# Patient Record
Sex: Female | Born: 1990
Health system: Southern US, Community
[De-identification: ages and names within clinical notes are randomized; demographics above are authoritative.]

## PROBLEM LIST (undated history)

## (undated) ENCOUNTER — Inpatient Hospital Stay (HOSPITAL_COMMUNITY): Payer: Self-pay

## (undated) DIAGNOSIS — J45909 Unspecified asthma, uncomplicated: Secondary | ICD-10-CM

## (undated) HISTORY — PX: NO PAST SURGERIES: SHX2092

## (undated) HISTORY — PX: WISDOM TOOTH EXTRACTION: SHX21

---

## 2011-06-21 ENCOUNTER — Ambulatory Visit (INDEPENDENT_AMBULATORY_CARE_PROVIDER_SITE_OTHER): Payer: Self-pay | Admitting: Internal Medicine

## 2011-06-21 VITALS — BP 112/70 | HR 76 | Temp 98.5°F | Resp 16 | Ht 70.0 in | Wt 268.8 lb

## 2011-06-21 DIAGNOSIS — N926 Irregular menstruation, unspecified: Secondary | ICD-10-CM

## 2011-06-21 DIAGNOSIS — Z3202 Encounter for pregnancy test, result negative: Secondary | ICD-10-CM

## 2011-06-21 LAB — POCT CBC
Granulocyte percent: 57.4 %G (ref 37–80)
MCH, POC: 29.3 pg (ref 27–31.2)
MID (cbc): 0.4 (ref 0–0.9)
MPV: 9.5 fL (ref 0–99.8)
POC LYMPH PERCENT: 37.3 %L (ref 10–50)
POC MID %: 5.3 %M (ref 0–12)
Platelet Count, POC: 359 10*3/uL (ref 142–424)
RBC: 4.47 M/uL (ref 4.04–5.48)
RDW, POC: 13.6 %

## 2011-06-21 NOTE — Patient Instructions (Signed)
To [facility] if you have increased abdominal pain, heavier bleeding or fever. Call Sunday for lab results.

## 2011-06-21 NOTE — Progress Notes (Signed)
  Subjective:    Patient ID: Jamie [name], female    DOB: 28-Mar-1991, 21 y.o.   MRN: [id number]  HPI Ms. Spear presents today requesting pregnancy test. States menses should have started 06/17/11 and did not.  She checked a home pregnancy test that was positive the next day on 06/18/11.  She noticed pain that felt like menstrual cramps on 06/19/11 and then began vaginal bleeding on 06/20/11 to present. Keyly states her flow is typical for her normal menses and has not noticed any clotting that isn't typical for her cycle.  She did have one episode of n/v on 06/18/11 but none since. Yalexa states she had elective AB last year. She currently is not using any form of contraception.  Lo Estrin made her sick in past and desires the [name].  She has no current appointment for this to be done.   Review of Systems  Constitutional: Negative for fever and chills.  Gastrointestinal: Positive for nausea (one episode), vomiting and abdominal pain (cramping).  Genitourinary: Positive for vaginal bleeding and vaginal pain. Negative for menstrual problem and pelvic pain.  Neurological: Positive for headaches.       Objective:   Physical Exam  Constitutional: She is oriented to person, place, and time. Vital signs are normal. She appears well-developed and well-nourished.  Cardiovascular: Normal rate and regular rhythm.   Pulmonary/Chest: Effort normal and breath sounds normal.  Abdominal: Soft. Normal appearance. There is no tenderness.  Genitourinary: Uterus is not enlarged. Cervix exhibits no motion tenderness. Right adnexum displays no tenderness. Left adnexum displays tenderness.       Small amt of active bleeding at os. Copious blood in vault. No tissue noted.  Neurological: She is alert and oriented to person, place, and time.    Results for orders placed in visit on 06/21/11  POCT URINE PREGNANCY      Component Value Range   Preg Test, Ur Negative    POCT CBC      Component Value Range   WBC 7.4  4.6 -  10.2 (K/uL)   Lymph, poc 2.8  0.6 - 3.4    POC LYMPH PERCENT 37.3  10 - 50 (%L)   MID (cbc) 0.4  0 - 0.9    POC MID % 5.3  0 - 12 (%M)   POC Granulocyte 4.2  2 - 6.9    Granulocyte percent 57.4  37 - 80 (%G)   RBC 4.47  4.04 - 5.48 (M/uL)   Hemoglobin 13.1  12.2 - 16.2 (g/dL)   HCT, POC 40.9  81.1 - 47.9 (%)   MCV 92.5  80 - 97 (fL)   MCH, POC 29.3  27 - 31.2 (pg)   MCHC 31.7 (*) 31.8 - 35.4 (g/dL)   RDW, POC 91.4     Platelet Count, POC 359  142 - 424 (K/uL)   MPV 9.5  0 - 99.8 (fL)         Assessment & Plan:  Abnormal vaginal bleeding ?if regular menses vs spontaneous AB  Since home pregnancy test was positive and test in office today is negative, check quant HCG today.  Await these results to determine if further testing necessary as Alexsus is without pain at this point.  If she experiences increase bleeding, pain or fever, advised pt to go to Maternity Admissions at [facility] for evaluation. [facility] Dept Family Planning information given regarding [name] placement and other options.  Discussed with Dr. Merla Riches

## 2011-06-25 ENCOUNTER — Telehealth: Payer: Self-pay

## 2011-06-25 NOTE — Telephone Encounter (Signed)
Pt would like to know if lab results are in, she states that she was told that she would be called on Monday.

## 2011-06-25 NOTE — Telephone Encounter (Signed)
See lab msg. Pt notified

## 2012-12-14 ENCOUNTER — Encounter: Payer: Self-pay | Admitting: Obstetrics

## 2012-12-14 ENCOUNTER — Ambulatory Visit (INDEPENDENT_AMBULATORY_CARE_PROVIDER_SITE_OTHER): Payer: Medicaid Other | Admitting: Obstetrics

## 2012-12-14 VITALS — BP 113/75 | Temp 97.9°F | Wt 241.0 lb

## 2012-12-14 DIAGNOSIS — R519 Headache, unspecified: Secondary | ICD-10-CM

## 2012-12-14 DIAGNOSIS — Z369 Encounter for antenatal screening, unspecified: Secondary | ICD-10-CM

## 2012-12-14 DIAGNOSIS — Z3201 Encounter for pregnancy test, result positive: Secondary | ICD-10-CM

## 2012-12-14 DIAGNOSIS — Z113 Encounter for screening for infections with a predominantly sexual mode of transmission: Secondary | ICD-10-CM

## 2012-12-14 DIAGNOSIS — Z3402 Encounter for supervision of normal first pregnancy, second trimester: Secondary | ICD-10-CM

## 2012-12-14 DIAGNOSIS — R51 Headache: Secondary | ICD-10-CM

## 2012-12-14 LAB — POCT URINALYSIS DIPSTICK
Blood, UA: NEGATIVE
Glucose, UA: NEGATIVE
Nitrite, UA: NEGATIVE
Urobilinogen, UA: NEGATIVE

## 2012-12-14 NOTE — Progress Notes (Signed)
P-91 Pt states she is having lower abdomen cramping that comes and goes. Pt states she is having lower abdomen pain.   Subjective:    Jamie [name] is being seen today for her first obstetrical visit.  This is not a planned pregnancy. She is at [redacted]w[redacted]d gestation. Her obstetrical history is significant for none. Relationship with FOB: significant other, not living together. Patient does intend to breast feed. Pregnancy history fully reviewed.  Menstrual History: OB History   Grav Para Term Preterm Abortions TAB SAB Ect Mult Living   1               Last Pap: 07/2011 Last Results were normal Menarche age: 25 Regular Patient's last menstrual period was 08/19/2012.    The following portions of the patient's history were reviewed and updated as appropriate: allergies, current medications, past family history, past medical history, past social history, past surgical history and problem list.  Review of Systems Pertinent items are noted in HPI.    Objective:    General appearance: alert and no distress Pelvic: cervix normal in appearance, external genitalia normal, no adnexal masses or tenderness, no cervical motion tenderness and vagina normal without discharge Extremities: extremities normal, atraumatic, no cyanosis or edema    Assessment:    Pregnancy at [redacted]w[redacted]d weeks    Plan:    Initial labs drawn. Prenatal vitamins.  Counseling provided regarding continued use of seat belts, cessation of alcohol consumption, smoking or use of illicit drugs; infection precautions i.e., influenza/TDAP immunizations, toxoplasmosis,CMV, parvovirus, listeria and varicella; workplace safety, exercise during pregnancy; routine dental care, safe medications, sexual activity, hot tubs, saunas, pools, travel, caffeine use, fish and methlymercury, potential toxins, hair treatments, varicose veins Weight gain recommendations reviewed: underweight/BMI< 18.5--> gain 28 - 40 lbs; normal weight/BMI 18.5 - 24.9--> gain  25 - 35 lbs; overweight/BMI 25 - 29.9--> gain 15 - 25 lbs; obese/BMI >30->gain  11 - 20 lbs Problem list reviewed and updated. AFP3 discussed: requested. Role of ultrasound in pregnancy discussed; fetal survey: requested. Amniocentesis discussed: not indicated. Follow up in 4 weeks. 50% of 20 min visit spent on counseling and coordination of care.

## 2012-12-15 ENCOUNTER — Encounter: Payer: Self-pay | Admitting: Obstetrics

## 2012-12-15 LAB — OBSTETRIC PANEL
Basophils Absolute: 0 10*3/uL (ref 0.0–0.1)
Basophils Relative: 0 % (ref 0–1)
Eosinophils Absolute: 0.1 10*3/uL (ref 0.0–0.7)
Eosinophils Relative: 1 % (ref 0–5)
Hepatitis B Surface Ag: NEGATIVE
Lymphs Abs: 2.2 10*3/uL (ref 0.7–4.0)
MCH: 30.4 pg (ref 26.0–34.0)
Neutrophils Relative %: 61 % (ref 43–77)
Platelets: 293 10*3/uL (ref 150–400)
RBC: 3.72 MIL/uL — ABNORMAL LOW (ref 3.87–5.11)
RDW: 13.9 % (ref 11.5–15.5)

## 2012-12-15 LAB — WET PREP BY MOLECULAR PROBE
Gardnerella vaginalis: POSITIVE — AB
Trichomonas vaginosis: NEGATIVE

## 2012-12-15 LAB — GC/CHLAMYDIA PROBE AMP
CT Probe RNA: NEGATIVE
GC Probe RNA: NEGATIVE

## 2012-12-15 LAB — HIV ANTIBODY (ROUTINE TESTING W REFLEX): HIV: NONREACTIVE

## 2012-12-16 ENCOUNTER — Other Ambulatory Visit: Payer: Self-pay | Admitting: *Deleted

## 2012-12-16 DIAGNOSIS — B9689 Other specified bacterial agents as the cause of diseases classified elsewhere: Secondary | ICD-10-CM

## 2012-12-16 LAB — AFP, QUAD SCREEN
Age Alone: 1:1110 {titer}
Down Syndrome Scr Risk Est: <1:38500 {titer}
HCG, Total: 5465 m[IU]/mL
INH: 94.1 pg/mL
MoM for AFP: 0.83
MoM for hCG: 0.32
Open Spina bifida: NEGATIVE
Osb Risk: 1:48900 {titer}
Tri 18 Scr Risk Est: NEGATIVE
Trisomy 18 (Edward) Syndrome Interp.: 1:8290 {titer}

## 2012-12-16 LAB — HEMOGLOBINOPATHY EVALUATION
Hemoglobin Other: 0 %
Hgb A2 Quant: 2.7 % (ref 2.2–3.2)
Hgb A: 97.3 % (ref 96.8–97.8)
Hgb F Quant: 0 % (ref 0.0–2.0)
Hgb S Quant: 0 %

## 2012-12-16 MED ORDER — METRONIDAZOLE 500 MG PO TABS: 500.0000 mg | ORAL_TABLET | Freq: Two times a day (BID) | ORAL | Status: DC

## 2012-12-16 NOTE — Progress Notes (Signed)
lmomtcb x 1. Prescription sent to pharmacy.

## 2012-12-17 LAB — OB RESULTS CONSOLE GC/CHLAMYDIA
Chlamydia: NEGATIVE
GC PROBE AMP, GENITAL: NEGATIVE

## 2012-12-22 ENCOUNTER — Ambulatory Visit (INDEPENDENT_AMBULATORY_CARE_PROVIDER_SITE_OTHER): Payer: Medicaid Other

## 2012-12-22 ENCOUNTER — Other Ambulatory Visit: Payer: Medicaid Other

## 2012-12-22 ENCOUNTER — Encounter: Payer: Self-pay | Admitting: Obstetrics & Gynecology

## 2012-12-22 ENCOUNTER — Encounter: Payer: Self-pay | Admitting: *Deleted

## 2012-12-22 DIAGNOSIS — Z111 Encounter for screening for respiratory tuberculosis: Secondary | ICD-10-CM

## 2012-12-22 DIAGNOSIS — Z369 Encounter for antenatal screening, unspecified: Secondary | ICD-10-CM

## 2012-12-22 DIAGNOSIS — Z3400 Encounter for supervision of normal first pregnancy, unspecified trimester: Secondary | ICD-10-CM

## 2012-12-22 LAB — US OB DETAIL + 14 WK

## 2012-12-23 ENCOUNTER — Encounter: Payer: Self-pay | Admitting: Obstetrics & Gynecology

## 2012-12-23 ENCOUNTER — Encounter: Payer: Self-pay | Admitting: Obstetrics

## 2012-12-23 LAB — US OB DETAIL + 14 WK

## 2012-12-27 ENCOUNTER — Telehealth: Payer: Self-pay | Admitting: *Deleted

## 2012-12-27 NOTE — Telephone Encounter (Signed)
Patient calling for her test results and requesting Vitafol nano called to her pharmacy. Patient to come to office to pick up a copy of her results for school and Rx has been called to her pharmacy.

## 2013-01-10 ENCOUNTER — Ambulatory Visit (INDEPENDENT_AMBULATORY_CARE_PROVIDER_SITE_OTHER): Payer: Medicaid Other | Admitting: Obstetrics

## 2013-01-10 ENCOUNTER — Encounter: Payer: Medicaid Other | Admitting: Obstetrics

## 2013-01-10 ENCOUNTER — Encounter: Payer: Self-pay | Admitting: Obstetrics

## 2013-01-10 VITALS — BP 121/77 | Temp 98.6°F | Wt 249.0 lb

## 2013-01-10 DIAGNOSIS — Z3402 Encounter for supervision of normal first pregnancy, second trimester: Secondary | ICD-10-CM

## 2013-01-10 DIAGNOSIS — Z3400 Encounter for supervision of normal first pregnancy, unspecified trimester: Secondary | ICD-10-CM

## 2013-01-10 LAB — POCT URINALYSIS DIPSTICK
Bilirubin, UA: NEGATIVE
Blood, UA: NEGATIVE
Glucose, UA: NEGATIVE
Nitrite, UA: NEGATIVE
Urobilinogen, UA: NEGATIVE

## 2013-01-10 NOTE — Progress Notes (Signed)
P 75 Patient reports she is doing well.

## 2013-02-04 ENCOUNTER — Encounter: Payer: Self-pay | Admitting: Advanced Practice Midwife

## 2013-02-04 ENCOUNTER — Other Ambulatory Visit: Payer: Medicaid Other

## 2013-02-04 ENCOUNTER — Ambulatory Visit (INDEPENDENT_AMBULATORY_CARE_PROVIDER_SITE_OTHER): Payer: Medicaid Other | Admitting: Advanced Practice Midwife

## 2013-02-04 VITALS — BP 108/72 | Temp 98.3°F | Wt 246.0 lb

## 2013-02-04 DIAGNOSIS — Z3400 Encounter for supervision of normal first pregnancy, unspecified trimester: Secondary | ICD-10-CM | POA: Insufficient documentation

## 2013-02-04 DIAGNOSIS — Z3402 Encounter for supervision of normal first pregnancy, second trimester: Secondary | ICD-10-CM

## 2013-02-04 LAB — CBC
Hemoglobin: 12 g/dL (ref 12.0–15.0)
MCH: 32.1 pg (ref 26.0–34.0)
MCHC: 34.7 g/dL (ref 30.0–36.0)
RDW: 14.2 % (ref 11.5–15.5)

## 2013-02-04 LAB — POCT URINALYSIS DIPSTICK
Blood, UA: NEGATIVE
Ketones, UA: NEGATIVE
Nitrite, UA: NEGATIVE
pH, UA: 8

## 2013-02-04 NOTE — Progress Notes (Signed)
Pulse: 89

## 2013-02-04 NOTE — Progress Notes (Signed)
Subjective: Jamie [name] is a 22 y.o. at 24 weeks by LMP  Patient denies vaginal leaking of fluid or bleeding, denies contractions.  Reports positive fetal movment.  Patient reports recent mild constipation w/ hemorrhoids. Reports episode of mild bleeding w/ hemorrhoid.   Patient plans to breastfeed. Has + support at home, lives with her Mother. The [facility] is supportive and involved.   Objective: Filed Vitals:   02/04/13 0904  BP: 108/72  Temp: 98.3 F (36.8 C)   150 FHR 24 Fundal Height Fetal Position unknown  Assessment: Patient Active Problem List   Diagnosis Date Noted  . Supervision of normal first pregnancy 02/04/2013    Plan: Patient to return to clinic in 4 weeks GTT today Reviewed breastfeeding today. Reviewed treatment and prevention of constipation and hemorrhoids today. Reviewed warning signs in pregnancy. Patient to call with concerns PRN. Reviewed triage location.  Major Santerre Wilson Singer CNM

## 2013-02-05 LAB — HIV ANTIBODY (ROUTINE TESTING W REFLEX): HIV: NONREACTIVE

## 2013-02-05 LAB — GLUCOSE TOLERANCE, 2 HOURS W/ 1HR
Glucose, 1 hour: 85 mg/dL (ref 70–170)
Glucose, Fasting: 63 mg/dL — ABNORMAL LOW (ref 70–99)

## 2013-03-03 ENCOUNTER — Encounter: Payer: Medicaid Other | Admitting: Obstetrics

## 2013-03-04 ENCOUNTER — Encounter: Payer: Medicaid Other | Admitting: Advanced Practice Midwife

## 2013-03-07 ENCOUNTER — Ambulatory Visit (INDEPENDENT_AMBULATORY_CARE_PROVIDER_SITE_OTHER): Payer: Medicaid Other | Admitting: Obstetrics

## 2013-03-07 VITALS — BP 116/76 | Temp 98.6°F | Wt 247.0 lb

## 2013-03-07 DIAGNOSIS — Z3400 Encounter for supervision of normal first pregnancy, unspecified trimester: Secondary | ICD-10-CM

## 2013-03-07 DIAGNOSIS — Z3403 Encounter for supervision of normal first pregnancy, third trimester: Secondary | ICD-10-CM

## 2013-03-07 DIAGNOSIS — B49 Unspecified mycosis: Secondary | ICD-10-CM

## 2013-03-07 LAB — POCT URINALYSIS DIPSTICK
Bilirubin, UA: NEGATIVE
Blood, UA: NEGATIVE
Glucose, UA: NEGATIVE
Nitrite, UA: NEGATIVE
Spec Grav, UA: 1.015
Urobilinogen, UA: NEGATIVE
pH, UA: 7

## 2013-03-07 MED ORDER — CLOTRIMAZOLE 1 % EX CREA: TOPICAL_CREAM | Freq: Two times a day (BID) | CUTANEOUS | Status: DC

## 2013-03-07 NOTE — Progress Notes (Signed)
P 90 Patient reports she has some pressure at times. Patient has a rash under her R arm.

## 2013-03-08 ENCOUNTER — Encounter: Payer: Self-pay | Admitting: Obstetrics

## 2013-03-21 ENCOUNTER — Ambulatory Visit (INDEPENDENT_AMBULATORY_CARE_PROVIDER_SITE_OTHER): Payer: Medicaid Other | Admitting: Obstetrics

## 2013-03-21 ENCOUNTER — Encounter: Payer: Self-pay | Admitting: Obstetrics

## 2013-03-21 VITALS — BP 112/72 | Temp 97.8°F | Wt 247.0 lb

## 2013-03-21 DIAGNOSIS — Z3400 Encounter for supervision of normal first pregnancy, unspecified trimester: Secondary | ICD-10-CM

## 2013-03-21 DIAGNOSIS — Z3403 Encounter for supervision of normal first pregnancy, third trimester: Secondary | ICD-10-CM

## 2013-03-21 LAB — POCT URINALYSIS DIPSTICK
Bilirubin, UA: NEGATIVE
Blood, UA: NEGATIVE
Glucose, UA: NEGATIVE
Ketones, UA: NEGATIVE
Nitrite, UA: NEGATIVE
Spec Grav, UA: 1.02
Urobilinogen, UA: NEGATIVE

## 2013-03-21 NOTE — Progress Notes (Signed)
P 79 Patient reports she is fatigued. Patient states she has ligament pain.

## 2013-04-04 ENCOUNTER — Ambulatory Visit (INDEPENDENT_AMBULATORY_CARE_PROVIDER_SITE_OTHER): Payer: Medicaid Other | Admitting: Obstetrics

## 2013-04-04 VITALS — BP 115/79 | Temp 97.5°F | Wt 250.0 lb

## 2013-04-04 DIAGNOSIS — Z3403 Encounter for supervision of normal first pregnancy, third trimester: Secondary | ICD-10-CM

## 2013-04-04 DIAGNOSIS — Z3400 Encounter for supervision of normal first pregnancy, unspecified trimester: Secondary | ICD-10-CM

## 2013-04-04 LAB — POCT URINALYSIS DIPSTICK
Bilirubin, UA: NEGATIVE
Blood, UA: NEGATIVE
Glucose, UA: NEGATIVE
Nitrite, UA: NEGATIVE
Spec Grav, UA: 1.015
Urobilinogen, UA: NEGATIVE
pH, UA: 7

## 2013-04-04 NOTE — Progress Notes (Signed)
Pulse: 91

## 2013-04-18 ENCOUNTER — Ambulatory Visit (INDEPENDENT_AMBULATORY_CARE_PROVIDER_SITE_OTHER): Payer: Medicaid Other | Admitting: Obstetrics

## 2013-04-18 ENCOUNTER — Encounter: Payer: Self-pay | Admitting: Obstetrics

## 2013-04-18 VITALS — BP 123/75 | Temp 98.4°F | Wt 253.0 lb

## 2013-04-18 DIAGNOSIS — Z3400 Encounter for supervision of normal first pregnancy, unspecified trimester: Secondary | ICD-10-CM

## 2013-04-18 DIAGNOSIS — Z3403 Encounter for supervision of normal first pregnancy, third trimester: Secondary | ICD-10-CM

## 2013-04-18 LAB — POCT URINALYSIS DIPSTICK
Blood, UA: NEGATIVE
Glucose, UA: NEGATIVE
Protein, UA: NEGATIVE
Spec Grav, UA: 1.01
Urobilinogen, UA: NEGATIVE

## 2013-04-18 NOTE — Progress Notes (Signed)
Pulse- 80 

## 2013-04-25 ENCOUNTER — Ambulatory Visit (INDEPENDENT_AMBULATORY_CARE_PROVIDER_SITE_OTHER): Payer: Medicaid Other | Admitting: Obstetrics

## 2013-04-25 ENCOUNTER — Encounter: Payer: Self-pay | Admitting: Obstetrics

## 2013-04-25 VITALS — BP 116/77 | Temp 98.1°F | Wt 255.0 lb

## 2013-04-25 DIAGNOSIS — Z3403 Encounter for supervision of normal first pregnancy, third trimester: Secondary | ICD-10-CM

## 2013-04-25 DIAGNOSIS — Z3400 Encounter for supervision of normal first pregnancy, unspecified trimester: Secondary | ICD-10-CM

## 2013-04-25 LAB — POCT URINALYSIS DIPSTICK
Glucose, UA: NEGATIVE
Ketones, UA: NEGATIVE
Protein, UA: NEGATIVE
Spec Grav, UA: 1.01
pH, UA: 7

## 2013-04-25 NOTE — Progress Notes (Signed)
Pulse 83 Pt is doing well.   Pt needs GBS.

## 2013-04-27 LAB — STREP B DNA PROBE: GBSP: NEGATIVE

## 2013-05-04 ENCOUNTER — Ambulatory Visit (INDEPENDENT_AMBULATORY_CARE_PROVIDER_SITE_OTHER): Payer: Medicaid Other | Admitting: Obstetrics

## 2013-05-04 VITALS — BP 117/77 | Temp 98.1°F | Wt 254.0 lb

## 2013-05-04 DIAGNOSIS — Z3400 Encounter for supervision of normal first pregnancy, unspecified trimester: Secondary | ICD-10-CM

## 2013-05-04 DIAGNOSIS — Z3403 Encounter for supervision of normal first pregnancy, third trimester: Secondary | ICD-10-CM

## 2013-05-04 LAB — POCT URINALYSIS DIPSTICK
Bilirubin, UA: NEGATIVE
Blood, UA: NEGATIVE
Leukocytes, UA: NEGATIVE
Nitrite, UA: NEGATIVE
Spec Grav, UA: 1.02

## 2013-05-04 NOTE — Progress Notes (Signed)
Pulse 92 Pt states that she has had SOB for the past week, worse at night.  Pt is also having some pain between her shoulder blades.

## 2013-05-05 NOTE — L&D Delivery Note (Signed)
Delivery Note At 11:53 AM a viable female was delivered via Vaginal, Spontaneous Delivery (Presentation: ;  ).  APGAR: 8, 9; weight 7 lb 4.8 oz (3311 g).   Placenta status: Intact, Spontaneous.  Cord: 3 vessels with the following complications: None.  Cord pH: none  Anesthesia: None  Episiotomy: None Lacerations: 2nd degree;Perineal Suture Repair: 2.0  Chromic Est. Blood Loss (mL): 400  Mom to postpartum.  Baby to Couplet care / Skin to Skin.  Winford Hehn A 05/22/2013, 2:07 PM

## 2013-05-06 ENCOUNTER — Encounter: Payer: Self-pay | Admitting: Obstetrics

## 2013-05-10 ENCOUNTER — Encounter: Payer: Self-pay | Admitting: Obstetrics

## 2013-05-10 ENCOUNTER — Ambulatory Visit (INDEPENDENT_AMBULATORY_CARE_PROVIDER_SITE_OTHER): Payer: Medicaid Other | Admitting: Obstetrics

## 2013-05-10 VITALS — BP 112/76 | Temp 97.6°F | Wt 263.0 lb

## 2013-05-10 DIAGNOSIS — Z3403 Encounter for supervision of normal first pregnancy, third trimester: Secondary | ICD-10-CM

## 2013-05-10 DIAGNOSIS — Z3400 Encounter for supervision of normal first pregnancy, unspecified trimester: Secondary | ICD-10-CM

## 2013-05-10 LAB — POCT URINALYSIS DIPSTICK
Bilirubin, UA: NEGATIVE
Glucose, UA: NEGATIVE
KETONES UA: NEGATIVE
Leukocytes, UA: NEGATIVE
PH UA: 5
RBC UA: NEGATIVE
Spec Grav, UA: 1.02
Urobilinogen, UA: NEGATIVE

## 2013-05-10 NOTE — Progress Notes (Signed)
Pulse 73 Pt is doing well.

## 2013-05-17 ENCOUNTER — Encounter: Payer: Medicaid Other | Admitting: Obstetrics

## 2013-05-19 ENCOUNTER — Encounter: Payer: Medicaid Other | Admitting: Obstetrics

## 2013-05-20 ENCOUNTER — Ambulatory Visit (INDEPENDENT_AMBULATORY_CARE_PROVIDER_SITE_OTHER): Payer: Medicaid Other | Admitting: Obstetrics

## 2013-05-20 ENCOUNTER — Encounter: Payer: Self-pay | Admitting: Obstetrics

## 2013-05-20 VITALS — BP 136/83 | Temp 97.1°F | Wt 264.0 lb

## 2013-05-20 DIAGNOSIS — Z3400 Encounter for supervision of normal first pregnancy, unspecified trimester: Secondary | ICD-10-CM

## 2013-05-20 LAB — POCT URINALYSIS DIPSTICK
BILIRUBIN UA: NEGATIVE
KETONES UA: NEGATIVE
Nitrite, UA: NEGATIVE
RBC UA: NEGATIVE
SPEC GRAV UA: 1.015
Urobilinogen, UA: NEGATIVE
pH, UA: 6

## 2013-05-20 NOTE — Progress Notes (Signed)
Pulse- 87 Patient would like to discuss circ. information

## 2013-05-22 ENCOUNTER — Inpatient Hospital Stay (HOSPITAL_COMMUNITY)
Admission: AD | Admit: 2013-05-22 | Discharge: 2013-05-24 | DRG: 775 | Disposition: A | Discharge status: Home / Self Care | Payer: Medicaid Other | Source: Ambulatory Visit | Attending: Obstetrics | Admitting: Obstetrics

## 2013-05-22 ENCOUNTER — Encounter (HOSPITAL_COMMUNITY): Payer: Self-pay | Admitting: *Deleted

## 2013-05-22 DIAGNOSIS — Z3400 Encounter for supervision of normal first pregnancy, unspecified trimester: Secondary | ICD-10-CM

## 2013-05-22 DIAGNOSIS — IMO0001 Reserved for inherently not codable concepts without codable children: Secondary | ICD-10-CM

## 2013-05-22 LAB — CBC
HEMATOCRIT: 35.5 % — AB (ref 36.0–46.0)
HEMOGLOBIN: 12.5 g/dL (ref 12.0–15.0)
MCH: 31.6 pg (ref 26.0–34.0)
MCHC: 35.2 g/dL (ref 30.0–36.0)
MCV: 89.6 fL (ref 78.0–100.0)
Platelets: 219 10*3/uL (ref 150–400)
RBC: 3.96 MIL/uL (ref 3.87–5.11)
RDW: 13.5 % (ref 11.5–15.5)
WBC: 7.4 10*3/uL (ref 4.0–10.5)

## 2013-05-22 LAB — RPR: RPR Ser Ql: NONREACTIVE

## 2013-05-22 LAB — ABO/RH: ABO/RH(D): O POS

## 2013-05-22 LAB — TYPE AND SCREEN
ABO/RH(D): O POS
Antibody Screen: NEGATIVE

## 2013-05-22 MED ORDER — PROMETHAZINE HCL 25 MG/ML IJ SOLN: 25.0000 mg | Freq: Four times a day (QID) | INTRAMUSCULAR | Status: DC | PRN

## 2013-05-22 MED ORDER — LACTATED RINGERS IV SOLN
INTRAVENOUS | Status: DC
  Administered 2013-05-22: 09:00:00 via INTRAVENOUS

## 2013-05-22 MED ORDER — ONDANSETRON HCL 4 MG/2ML IJ SOLN: 4.0000 mg | INTRAMUSCULAR | Status: DC | PRN

## 2013-05-22 MED ORDER — OXYTOCIN 40 UNITS IN LACTATED RINGERS INFUSION - SIMPLE MED: 62.5000 mL/h | INTRAVENOUS | Status: DC | PRN

## 2013-05-22 MED ORDER — ZOLPIDEM TARTRATE 5 MG PO TABS: 5.0000 mg | ORAL_TABLET | Freq: Every evening | ORAL | Status: DC | PRN

## 2013-05-22 MED ORDER — OXYTOCIN BOLUS FROM INFUSION: 500.0000 mL | INTRAVENOUS | Status: DC

## 2013-05-22 MED ORDER — DIPHENHYDRAMINE HCL 50 MG/ML IJ SOLN: 12.5000 mg | INTRAMUSCULAR | Status: DC | PRN

## 2013-05-22 MED ORDER — METHYLERGONOVINE MALEATE 0.2 MG/ML IJ SOLN
INTRAMUSCULAR | Status: AC
  Administered 2013-05-22: 0.2 mg
  Filled 2013-05-22: qty 1

## 2013-05-22 MED ORDER — DIBUCAINE 1 % RE OINT: 1.0000 "application " | TOPICAL_OINTMENT | RECTAL | Status: DC | PRN

## 2013-05-22 MED ORDER — FLEET ENEMA 7-19 GM/118ML RE ENEM: 1.0000 | ENEMA | RECTAL | Status: DC | PRN

## 2013-05-22 MED ORDER — METHYLERGONOVINE MALEATE 0.2 MG/ML IJ SOLN: 0.2000 mg | Freq: Four times a day (QID) | INTRAMUSCULAR | Status: AC

## 2013-05-22 MED ORDER — SIMETHICONE 80 MG PO CHEW: 80.0000 mg | CHEWABLE_TABLET | ORAL | Status: DC | PRN

## 2013-05-22 MED ORDER — DIPHENHYDRAMINE HCL 25 MG PO CAPS: 25.0000 mg | ORAL_CAPSULE | Freq: Four times a day (QID) | ORAL | Status: DC | PRN

## 2013-05-22 MED ORDER — OXYTOCIN 40 UNITS IN LACTATED RINGERS INFUSION - SIMPLE MED
62.5000 mL/h | INTRAVENOUS | Status: DC
  Filled 2013-05-22: qty 1000

## 2013-05-22 MED ORDER — OXYCODONE-ACETAMINOPHEN 5-325 MG PO TABS: 1.0000 | ORAL_TABLET | ORAL | Status: DC | PRN

## 2013-05-22 MED ORDER — PHENYLEPHRINE 40 MCG/ML (10ML) SYRINGE FOR IV PUSH (FOR BLOOD PRESSURE SUPPORT)
80.0000 ug | PREFILLED_SYRINGE | INTRAVENOUS | Status: DC | PRN
  Filled 2013-05-22: qty 2

## 2013-05-22 MED ORDER — WITCH HAZEL-GLYCERIN EX PADS: 1.0000 "application " | MEDICATED_PAD | CUTANEOUS | Status: DC | PRN

## 2013-05-22 MED ORDER — LANOLIN HYDROUS EX OINT
TOPICAL_OINTMENT | CUTANEOUS | Status: DC | PRN
Start: 2013-05-22 — End: 2013-05-24

## 2013-05-22 MED ORDER — PRENATAL MULTIVITAMIN CH: 1.0000 | ORAL_TABLET | Freq: Every day | ORAL | Status: DC

## 2013-05-22 MED ORDER — LACTATED RINGERS IV SOLN: 500.0000 mL | Freq: Once | INTRAVENOUS | Status: DC

## 2013-05-22 MED ORDER — LACTATED RINGERS IV SOLN: 500.0000 mL | INTRAVENOUS | Status: DC | PRN

## 2013-05-22 MED ORDER — METHYLERGONOVINE MALEATE 0.2 MG PO TABS
0.2000 mg | ORAL_TABLET | Freq: Four times a day (QID) | ORAL | Status: AC
  Administered 2013-05-22 – 2013-05-23 (×5): 0.2 mg via ORAL
  Filled 2013-05-22 (×5): qty 1

## 2013-05-22 MED ORDER — EPHEDRINE 5 MG/ML INJ
10.0000 mg | INTRAVENOUS | Status: DC | PRN
  Filled 2013-05-22: qty 2

## 2013-05-22 MED ORDER — TETANUS-DIPHTH-ACELL PERTUSSIS 5-2.5-18.5 LF-MCG/0.5 IM SUSP
0.5000 mL | Freq: Once | INTRAMUSCULAR | Status: AC
  Administered 2013-05-22: 0.5 mL via INTRAMUSCULAR

## 2013-05-22 MED ORDER — CITRIC ACID-SODIUM CITRATE 334-500 MG/5ML PO SOLN: 30.0000 mL | ORAL | Status: DC | PRN

## 2013-05-22 MED ORDER — ACETAMINOPHEN 325 MG PO TABS: 650.0000 mg | ORAL_TABLET | ORAL | Status: DC | PRN

## 2013-05-22 MED ORDER — IBUPROFEN 600 MG PO TABS: 600.0000 mg | ORAL_TABLET | Freq: Four times a day (QID) | ORAL | Status: DC | PRN

## 2013-05-22 MED ORDER — NALBUPHINE HCL 10 MG/ML IJ SOLN
10.0000 mg | Freq: Four times a day (QID) | INTRAMUSCULAR | Status: DC | PRN
  Filled 2013-05-22: qty 1

## 2013-05-22 MED ORDER — SENNOSIDES-DOCUSATE SODIUM 8.6-50 MG PO TABS
2.0000 | ORAL_TABLET | ORAL | Status: DC
  Administered 2013-05-23 (×2): 2 via ORAL
  Filled 2013-05-22 (×2): qty 2

## 2013-05-22 MED ORDER — ONDANSETRON HCL 4 MG/2ML IJ SOLN: 4.0000 mg | Freq: Four times a day (QID) | INTRAMUSCULAR | Status: DC | PRN

## 2013-05-22 MED ORDER — COMPLETENATE 29-1 MG PO CHEW
1.0000 | CHEWABLE_TABLET | Freq: Every day | ORAL | Status: DC
  Administered 2013-05-23 (×2): 1 via ORAL
  Filled 2013-05-22 (×3): qty 1

## 2013-05-22 MED ORDER — FENTANYL 2.5 MCG/ML BUPIVACAINE 1/10 % EPIDURAL INFUSION (WH - ANES): 14.0000 mL/h | INTRAMUSCULAR | Status: DC | PRN

## 2013-05-22 MED ORDER — ONDANSETRON HCL 4 MG PO TABS: 4.0000 mg | ORAL_TABLET | ORAL | Status: DC | PRN

## 2013-05-22 MED ORDER — LIDOCAINE HCL (PF) 1 % IJ SOLN
30.0000 mL | INTRAMUSCULAR | Status: AC | PRN
  Administered 2013-05-22: 30 mL via SUBCUTANEOUS
  Filled 2013-05-22 (×2): qty 30

## 2013-05-22 MED ORDER — BENZOCAINE-MENTHOL 20-0.5 % EX AERO
1.0000 "application " | INHALATION_SPRAY | CUTANEOUS | Status: DC | PRN
  Administered 2013-05-22 – 2013-05-23 (×2): 1 via TOPICAL
  Filled 2013-05-22 (×2): qty 56

## 2013-05-22 MED ORDER — IBUPROFEN 600 MG PO TABS
600.0000 mg | ORAL_TABLET | Freq: Four times a day (QID) | ORAL | Status: DC
  Administered 2013-05-22 – 2013-05-24 (×6): 600 mg via ORAL
  Filled 2013-05-22 (×7): qty 1

## 2013-05-22 MED ORDER — NALBUPHINE HCL 10 MG/ML IJ SOLN
10.0000 mg | INTRAMUSCULAR | Status: DC | PRN
  Filled 2013-05-22: qty 1

## 2013-05-22 MED ORDER — MEDROXYPROGESTERONE ACETATE 150 MG/ML IM SUSP: 150.0000 mg | INTRAMUSCULAR | Status: DC | PRN

## 2013-05-22 NOTE — Lactation Note (Signed)
This note was copied from the chart of Jamie [name]. Lactation Consultation Note  Patient Name: Jamie Jamie [name] ZOXWR'UToday's Date: 05/22/2013 Reason for consult: Initial assessment of this primipara and her newborn at 818 hours of age.  Mom has lots of visitors and baby asleep at present but latched well after delivery for 10 minutes with [facility] of [facility].  Mom states that her nurse has shown her hand expression and LC encouraged watching baby for feeding cues and place baby STS at regular intervals. LC encouraged review of Baby and Me pp 9, 14 and 20-25 for STS and BF information. LC provided [facility] Resource brochure and reviewed [facility] services and list of community and web site resources.     Maternal Data Formula Feeding for Exclusion: No Infant to breast within first hour of birth: Yes (initial LATCH score=8 and baby nursed 10 minutes) Has patient been taught Hand Expression?: Yes (mom states that she has been shown how to express colostrum) Does the patient have breastfeeding experience prior to this delivery?: No  Feeding Feeding Type: Breast Fed Length of feed: 60 min  LATCH Score/Interventions           initial LATCH score=8, per RN assessment after delivery           Lactation Tools Discussed/Used WIC Program: Yes STS, hand expression, cue feedings  Consult Status Consult Status: Follow-up Date: 05/23/13 Follow-up type: In-patient    Warrick ParisianBryant, Kimani Bedoya [facility] 05/22/2013, 8:27 PM

## 2013-05-22 NOTE — Progress Notes (Signed)
Dr. Clearance CootsHarper notified patient presents for labor eval. SVE 4/90/-2. No previous exam to compare to. UC hard to trace, toco adjusted. Will monitor and recheck in 1 hour.

## 2013-05-22 NOTE — H&P (Signed)
Jamie [name] is a 23 y.o. female presenting for UC's. Maternal Medical History:  Reason for admission: Contractions.  23 yo G1.  EDC 05-26-13.  Presents with UC's.  Fetal activity: Perceived fetal activity is normal.    Prenatal complications: no prenatal complications Prenatal Complications - Diabetes: none.    OB History   Grav Para Term Preterm Abortions TAB SAB Ect Mult Living   1              History reviewed. No pertinent past medical history. History reviewed. No pertinent past surgical history. Family History: family history includes Diabetes in her father and maternal grandfather; Heart attack in her paternal grandmother; Heart disease in her father and maternal grandfather; Hypertension in her maternal grandfather; Stroke in her father. Social History:  reports that she has never smoked. She does not have any smokeless tobacco history on file. She reports that she does not drink alcohol or use illicit drugs.   Prenatal Transfer Tool  Maternal Diabetes: No Genetic Screening: Normal Maternal Ultrasounds/Referrals: Normal Fetal Ultrasounds or other Referrals:  None Maternal Substance Abuse:  No Significant Maternal Medications:  None Significant Maternal Lab Results:  None Other Comments:  None  Review of Systems  All other systems reviewed and are negative.    Dilation: 10 Effacement (%): 100 Station: +1 Exam by:: J.Thornton, RN Blood pressure 135/69, pulse 89, temperature 98.7 F (37.1 C), temperature source Oral, resp. rate 18, height 5\' 9"  (1.753 m), weight 264 lb (119.75 kg), last menstrual period 08/19/2012, SpO2 99.00%. Maternal Exam:  Uterine Assessment: Contraction strength is moderate.  Abdomen: Patient reports no abdominal tenderness. Fetal presentation: vertex  Introitus: Normal vulva. Normal vagina.  Cervix: Cervix evaluated by digital exam.     Physical Exam  Nursing note and vitals reviewed. Constitutional: She is oriented to person, place, and  time. She appears well-developed and well-nourished.  HENT:  Head: Normocephalic and atraumatic.  Eyes: Conjunctivae are normal. Pupils are equal, round, and reactive to light.  Neck: Normal range of motion. Neck supple.  Cardiovascular: Normal rate and regular rhythm.   Respiratory: Effort normal and breath sounds normal.  GI: Soft.  Genitourinary: Vagina normal and uterus normal.  Musculoskeletal: Normal range of motion.  Neurological: She is alert and oriented to person, place, and time.  Skin: Skin is warm and dry.  Psychiatric: She has a normal mood and affect. Her behavior is normal. Judgment and thought content normal.    Prenatal labs: ABO, Rh: --/--/O POS (01/18 0910) Antibody: NEG (01/18 0910) Rubella: 1.45 (08/12 1527) RPR: NON REAC (10/03 1302)  HBsAg: NEGATIVE (08/12 1527)  HIV: NON REACTIVE (10/03 1302)  GBS: NEGATIVE (12/22 1505)   Assessment/Plan: 39.3 weeks.  Active labor,  Admit.  Jos Cygan A 05/22/2013, 11:40 AM

## 2013-05-22 NOTE — MAU Note (Signed)
Contractions every 5 minutes since 0200. Denies vaginal bleeding and LOF. Good FM.

## 2013-05-22 NOTE — Progress Notes (Signed)
Jamie [name] is a 23 y.o. G1P0 at 563w3d by LMP admitted for active labor  Subjective:   Objective: BP 135/69  Pulse 89  Temp(Src) 98.7 F (37.1 C) (Oral)  Resp 18  Ht 5\' 9"  (1.753 m)  Wt 264 lb (119.75 kg)  BMI 38.97 kg/m2  SpO2 99%  LMP 08/19/2012      FHT:  FHR: 120 bpm, variability: moderate,  accelerations:  Present,  decelerations:  Present variable UC:   regular, every 3 minutes SVE:   Dilation: 10 Effacement (%): 100 Station: +1 Exam by:: dr. Clearance Cootsharper  Labs: Lab Results  Component Value Date   WBC 7.4 05/22/2013   HGB 12.5 05/22/2013   HCT 35.5* 05/22/2013   MCV 89.6 05/22/2013   PLT 219 05/22/2013    Assessment / Plan: Spontaneous labor, progressing normally  Labor: Progressing normally Preeclampsia:  n/a Fetal Wellbeing:  Category I Pain Control:  Labor support without medications I/D:  n/a Anticipated MOD:  NSVD  HARPER,CHARLES A 05/22/2013, 11:44 AM

## 2013-05-23 LAB — CBC
HEMATOCRIT: 35.6 % — AB (ref 36.0–46.0)
Hemoglobin: 12.2 g/dL (ref 12.0–15.0)
MCH: 31 pg (ref 26.0–34.0)
MCHC: 34.3 g/dL (ref 30.0–36.0)
MCV: 90.4 fL (ref 78.0–100.0)
Platelets: 211 10*3/uL (ref 150–400)
RBC: 3.94 MIL/uL (ref 3.87–5.11)
RDW: 13.7 % (ref 11.5–15.5)
WBC: 10.5 10*3/uL (ref 4.0–10.5)

## 2013-05-23 NOTE — Progress Notes (Signed)
Post Partum Day 1 Subjective: no complaints  Objective: Blood pressure 112/80, pulse 81, temperature 98 F (36.7 C), temperature source Oral, resp. rate 20, height 5\' 9"  (1.753 m), weight 264 lb (119.75 kg), last menstrual period 08/19/2012, SpO2 98.00%, unknown if currently breastfeeding.  Physical Exam:  General: alert and no distress Lochia: appropriate Uterine Fundus: firm Incision: healing well DVT Evaluation: No evidence of DVT seen on physical exam.   Recent Labs  05/22/13 0910  HGB 12.5  HCT 35.5*    Assessment/Plan: Plan for discharge tomorrow   LOS: 1 day   HARPER,CHARLES A 05/23/2013, 6:23 AM

## 2013-05-23 NOTE — Progress Notes (Signed)
UR chart review completed.  

## 2013-05-24 ENCOUNTER — Encounter: Payer: Medicaid Other | Admitting: Obstetrics

## 2013-05-24 MED ORDER — OXYCODONE-ACETAMINOPHEN 5-325 MG PO TABS: 1.0000 | ORAL_TABLET | ORAL | Status: DC | PRN

## 2013-05-24 MED ORDER — IBUPROFEN 600 MG PO TABS: 600.0000 mg | ORAL_TABLET | Freq: Four times a day (QID) | ORAL | Status: DC | PRN

## 2013-05-24 NOTE — Discharge Instructions (Signed)
Before Baby Comes Home °Ask any questions about feeding, diapering, and baby care before you leave the hospital. Ask again if you do not understand. Ask when you need to see the doctor again. °There are several things you must have before your baby comes home. °· Infant car seat. °· Crib. °· Do not let your baby sleep in a bed with you or anyone else. °· If you do not have a bed for your baby, ask the doctor what you can use that will be safe for the baby to sleep in. °Infant feeding supplies: °· 6 to 8 bottles (8 oz. size). °· 6 to 8 nipples. °· Measuring cup. °· Measuring tablespoon. °· Bottle brush. °· Sterilizer (or use any large pan or kettle with a lid). °· Formula that contains iron. °· A way to boil and cool water. °Breastfeeding supplies: °· Breast pump. °· Nipple cream. °Clothing: °· 24 to 36 cloth diapers and waterproof diaper covers or a box of disposable diapers. You may need as many as 10 to 12 diapers per day. °· 3 onesies (other clothing will depend on the time of year and the weather). °· 3 receiving blankets. °· 3 baby pajamas or gowns. °· 3 bibs. °Bath equipment: °· Mild soap. °· Petroleum jelly. No baby oil or powder. °· Soft cloth towel and wash cloth. °· Cotton balls. °· Separate bath basin for baby. Only sponge bathe until umbilical cord and circumcision are healed. °Other supplies: °· Thermometer and bulb syringe (ask the hospital to send them home with you). Ask your doctor about how you should take your baby's temperature. °· One to two pacifiers. °Prepare for an emergency: °· Know how to get to the hospital and know where to admit your baby. °· Put all doctor numbers near your house phone and in your cell phone if you have one. °Prepare your family: °· Talk with siblings about the baby coming home and how they feel about it. °· Decide how you want to handle visitors and other family members. °· Take offers for help with the baby. You will need time to adjust. °Know when to call the doctor.   °GET HELP RIGHT AWAY IF: °· Your baby's temperature is greater than 100.4° F (38° C). °· The softspot on your baby's head starts to bulge. °· Your baby is crying with no tears or has no wet diapers for 6 hours. °· Your baby has rapid breathing. °· Your baby is not as alert. °Document Released: 04/03/2008 Document Revised: 07/14/2011 Document Reviewed: 07/11/2010 °ExitCare® Patient Information ©2014 [employer]. ° °

## 2013-05-24 NOTE — Progress Notes (Signed)
Post Partum Day 2 Subjective: no complaints  Objective: Blood pressure 105/71, pulse 85, temperature 98.7 F (37.1 C), temperature source Oral, resp. rate 20, height 5\' 9"  (1.753 m), weight 264 lb (119.75 kg), last menstrual period 08/19/2012, SpO2 98.00%, unknown if currently breastfeeding.  Physical Exam:  General: alert and no distress Lochia: appropriate Uterine Fundus: firm Incision: healing well DVT Evaluation: No evidence of DVT seen on physical exam.   Recent Labs  05/22/13 0910 05/23/13 0615  HGB 12.5 12.2  HCT 35.5* 35.6*    Assessment/Plan: Discharge home   LOS: 2 days   Kharlie Bring A 05/24/2013, 8:50 AM

## 2013-05-24 NOTE — Discharge Summary (Signed)
Obstetric Discharge Summary Reason for Admission: onset of labor Prenatal Procedures: ultrasound Intrapartum Procedures: spontaneous vaginal delivery Postpartum Procedures: none Complications-Operative and Postpartum: none Hemoglobin  Date Value Range Status  05/23/2013 12.2  12.0 - 15.0 g/dL Final  1/47/82952/16/2013 62.113.1  12.2 - 16.2 g/dL Corrected     HCT  Date Value Range Status  05/23/2013 35.6* 36.0 - 46.0 % Final     HCT, POC  Date Value Range Status  06/21/2011 41.3  37.7 - 47.9 % Corrected    Physical Exam:  General: alert and no distress Lochia: appropriate Uterine Fundus: firm Incision: healing well DVT Evaluation: No evidence of DVT seen on physical exam.  Discharge Diagnoses: Term Pregnancy-delivered  Discharge Information: Date: 05/24/2013 Activity: pelvic rest Diet: routine Medications: PNV, Ibuprofen, Colace and Percocet Condition: stable Instructions: refer to practice specific booklet Discharge to: home Follow-up Information   Follow up with HARPER,CHARLES A, MD. Schedule an appointment as soon as possible for a visit today.   Specialty:  Obstetrics and Gynecology   Contact information:   [address] [place] [id number] [phone]       Newborn Data: Live born female  Birth Weight: 7 lb 4.8 oz (3311 g) APGAR: 8, 9  Home with mother.  HARPER,CHARLES A 05/24/2013, 9:00 AM

## 2013-06-10 ENCOUNTER — Encounter: Payer: Self-pay | Admitting: Advanced Practice Midwife

## 2013-06-10 ENCOUNTER — Ambulatory Visit (INDEPENDENT_AMBULATORY_CARE_PROVIDER_SITE_OTHER): Payer: Medicaid Other | Admitting: Advanced Practice Midwife

## 2013-06-10 NOTE — Progress Notes (Signed)
  Subjective: no complaints and desires [name] Patient is breast and bottle feeding. She has returned back to school and she doesn't feel she can make the time to pump. She has good milk supply. Desires the [name] for contraception. Besides being a little tired overall feels good.  Objective: Blood pressure 109/72, pulse 83, temperature 97.7 F (36.5 C), height 5\' 9"  (1.753 m), weight 247 lb (112.038 kg), currently breastfeeding.  Physical Exam:  General: alert and cooperative Lochia: appropriate  Assessment/Plan: Contraception [name], handout and procedure discussed in detail. Reviewed risk and benefits. Breast and bottle feeding. Reviewed methods to help w/ pumping. Patient to RTC @ 6 weeks PP for [name] and visit.  20 min spent with patient greater than 80% spent in counseling and coordination of care.    Gerrica Cygan 06/10/2013, 4:47 PM

## 2013-06-10 NOTE — Progress Notes (Signed)
Patient in office today for post partum visit. Patient states she is 3 weeks post Partum. Patient states she has no bleeding. Patient states bowels and bladder are functioning normally. Patient states everything is WNL for her and the baby. Patient states she would like to talk about birth control. Patient states she would like to know if it is safe to use her lotrimin cream while breast feeding.

## 2013-07-06 ENCOUNTER — Ambulatory Visit (INDEPENDENT_AMBULATORY_CARE_PROVIDER_SITE_OTHER): Payer: Medicaid Other | Admitting: Obstetrics

## 2013-07-06 ENCOUNTER — Encounter: Payer: Self-pay | Admitting: Obstetrics

## 2013-07-06 DIAGNOSIS — N949 Unspecified condition associated with female genital organs and menstrual cycle: Secondary | ICD-10-CM

## 2013-07-06 DIAGNOSIS — Z3009 Encounter for other general counseling and advice on contraception: Secondary | ICD-10-CM

## 2013-07-06 DIAGNOSIS — R102 Pelvic and perineal pain: Secondary | ICD-10-CM

## 2013-07-06 NOTE — Progress Notes (Signed)
Subjective:     Jamie [name] is a 23 y.o. female who presents for a postpartum visit. She is 6 weeks postpartum following a spontaneous vaginal delivery. I have fully reviewed the prenatal and intrapartum course. The delivery was at 39 gestational weeks. Outcome: spontaneous vaginal delivery. Anesthesia: none. Postpartum course has been WNL. Patient states she just has little pains in her pelvic area.  Baby's course has been WNL. Baby is feeding by breast. Bleeding no bleeding. Bowel function is normal. Bladder function is normal. Patient is sexually active. Contraception method is none. Postpartum depression screening: negative.  The following portions of the patient's history were reviewed and updated as appropriate: allergies, current medications, past family history, past medical history, past social history, past surgical history and problem list.  Review of Systems Pertinent items are noted in HPI.   Objective:    LMP 05/22/2013  General:  alert and no distress Breasts:  Negative Abdomen:  Soft, NT. NEFG.  Uterus NSSC, NT.   Assessment:     Normal postpartum exam. Pap smear not done at today's visit.   Plan:    1. Contraception: [name] 2. Continue Micronor until [name] insertion. 3. Follow up in: 4 weeks or as needed.

## 2013-07-07 LAB — GC/CHLAMYDIA PROBE AMP
CT Probe RNA: NEGATIVE
GC Probe RNA: NEGATIVE

## 2013-07-07 LAB — WET PREP BY MOLECULAR PROBE
Candida species: NEGATIVE
Gardnerella vaginalis: NEGATIVE
Trichomonas vaginosis: NEGATIVE

## 2013-08-02 ENCOUNTER — Ambulatory Visit (INDEPENDENT_AMBULATORY_CARE_PROVIDER_SITE_OTHER): Payer: Medicaid Other | Admitting: Obstetrics

## 2013-08-02 ENCOUNTER — Encounter: Payer: Self-pay | Admitting: Obstetrics

## 2013-08-02 DIAGNOSIS — Z01818 Encounter for other preprocedural examination: Secondary | ICD-10-CM

## 2013-08-02 DIAGNOSIS — Z30430 Encounter for insertion of intrauterine contraceptive device: Secondary | ICD-10-CM

## 2013-08-02 LAB — WET PREP BY MOLECULAR PROBE
CANDIDA SPECIES: NEGATIVE
GARDNERELLA VAGINALIS: NEGATIVE
TRICHOMONAS VAG: NEGATIVE

## 2013-08-02 NOTE — Progress Notes (Signed)
[name] Insertion Procedure Note  Pre-operative Diagnosis: Desire contraception  Post-operative Diagnosis: same  Indications: contraception  Procedure Details  Urine pregnancy test was done in office and result was negative.  The risks (including infection, bleeding, pain, and uterine perforation) and benefits of the procedure were explained to the patient and Written informed consent was obtained.  Last unprotected sexual intercourse was 1 month ago.  Cervix cleansed with Betadine. Uterus sounded to 6 cm. [name] inserted without difficulty. String visible and trimmed. Patient tolerated procedure well  Condition: Stable  Complications: None  Plan:  The patient was advised to call for any fever or for prolonged or severe pain or bleeding. She was advised to use NSAID as needed for mild to moderate pain.   Attending Physician Documentation: I was present for or participated in the entire procedure, including opening and closing.

## 2013-08-04 LAB — GC/CHLAMYDIA PROBE AMP
CT PROBE, AMP APTIMA: NEGATIVE
GC PROBE AMP APTIMA: NEGATIVE

## 2013-08-06 ENCOUNTER — Encounter: Payer: Self-pay | Admitting: Obstetrics

## 2013-09-13 ENCOUNTER — Encounter: Payer: Self-pay | Admitting: Obstetrics

## 2013-09-13 ENCOUNTER — Ambulatory Visit (INDEPENDENT_AMBULATORY_CARE_PROVIDER_SITE_OTHER): Payer: Medicaid Other | Admitting: Obstetrics

## 2013-09-13 VITALS — BP 107/71 | HR 75 | Temp 98.7°F | Ht 69.0 in | Wt 256.0 lb

## 2013-09-13 DIAGNOSIS — Z30431 Encounter for routine checking of intrauterine contraceptive device: Secondary | ICD-10-CM

## 2013-09-14 ENCOUNTER — Encounter: Payer: Self-pay | Admitting: Obstetrics

## 2013-09-14 NOTE — Progress Notes (Signed)
Subjective:     Jamie [name] is a 23 y.o. female here for a routine exam.  Current complaints:   Presents for [name] surveillance.  No complaints.    Personal health questionnaire:  Is patient Jamie [name], have a family history of breast and/or ovarian cancer: no Is there a family history of uterine cancer diagnosed at age < 7950, gastrointestinal cancer, urinary tract cancer, family member who is a Personnel officerLynch syndrome-associated carrier: no Is the patient overweight and hypertensive, family history of diabetes, personal history of gestational diabetes or PCOS: no Is patient over 6055, have PCOS,  family history of premature CHD under age 24, diabetes, smoke, have hypertension or peripheral artery disease:  no  The HPI was reviewed and explored in further detail by the provider. Gynecologic History Patient's last menstrual period was 08/12/2013. Contraception: [name]   Obstetric History OB History  Gravida Para Term Preterm AB SAB TAB Ectopic Multiple Living  1 1 1       1     # Outcome Date GA Lbr Len/2nd Weight Sex Delivery Anes PTL Lv  1 TRM 05/22/13 [id number] 09:35 / 00:18 7 lb 4.8 oz (3.311 kg) M SVD None  Y      History reviewed. No pertinent past medical history.  History reviewed. No pertinent past surgical history.  Current outpatient prescriptions:prenatal vitamin w/FE, FA (PRENATAL 1 + 1) 27-1 MG TABS tablet, Take 1 tablet by mouth daily at 12 noon., Disp: , Rfl:  Allergies  Allergen Reactions  . Shellfish Allergy Swelling    Throat     History  Substance Use Topics  . Smoking status: Never Smoker   . Smokeless tobacco: Never Used  . Alcohol Use: No    Family History  Problem Relation Age of Onset  . Diabetes Father   . Stroke Father   . Heart disease Father   . Diabetes Maternal Grandfather   . Heart disease Maternal Grandfather   . Hypertension Maternal Grandfather   . Heart attack Paternal Grandmother       Review of Systems  Constitutional: negative for fatigue  and weight loss Respiratory: negative for cough and wheezing Cardiovascular: negative for chest pain, fatigue and palpitations Gastrointestinal: negative for abdominal pain and change in bowel habits Musculoskeletal:negative for myalgias Neurological: negative for gait problems and tremors Behavioral/Psych: negative for abusive relationship, depression Endocrine: negative for temperature intolerance   Genitourinary:negative for abnormal menstrual periods, genital lesions, hot flashes, sexual problems and vaginal discharge Integument/breast: negative for breast lump, breast tenderness, nipple discharge and skin lesion(s)    Objective:       BP 107/71  Pulse 75  Temp(Src) 98.7 F (37.1 C)  Ht 5\' 9"  (1.753 m)  Wt 256 lb (116.121 kg)  BMI 37.79 kg/m2  LMP 08/12/2013  Breastfeeding? No General:   alert  Skin:   no rash or abnormalities  Lungs:   clear to auscultation bilaterally  Heart:   regular rate and rhythm, S1, S2 normal, no murmur, click, rub or gallop  Breasts:   normal without suspicious masses, skin or nipple changes or axillary nodes  Abdomen:  normal findings: no organomegaly, soft, non-tender and no hernia  Pelvis:  External genitalia: normal general appearance Urinary system: urethral meatus normal and bladder without fullness, nontender Vaginal: normal without tenderness, induration or masses Cervix: normal appearance.  [name] string visible. Adnexa: normal bimanual exam Uterus: anteverted and non-tender, normal size   Lab Review Urine pregnancy test Labs reviewed yes Radiologic studies reviewed no  Assessment:    Healthy female exam.  Pleased with [name].   Plan:    Follow up in: several months.   No orders of the defined types were placed in this encounter.   No orders of the defined types were placed in this encounter.

## 2013-09-16 ENCOUNTER — Encounter (HOSPITAL_COMMUNITY): Payer: Self-pay | Admitting: Emergency Medicine

## 2013-09-16 ENCOUNTER — Emergency Department (HOSPITAL_COMMUNITY)
Admission: EM | Admit: 2013-09-16 | Discharge: 2013-09-16 | Disposition: A | Discharge status: Home / Self Care | Payer: Medicaid Other | Attending: Emergency Medicine | Admitting: Emergency Medicine

## 2013-09-16 ENCOUNTER — Emergency Department (HOSPITAL_COMMUNITY): Payer: Medicaid Other

## 2013-09-16 DIAGNOSIS — Y92410 Unspecified street and highway as the place of occurrence of the external cause: Secondary | ICD-10-CM | POA: Insufficient documentation

## 2013-09-16 DIAGNOSIS — Y9389 Activity, other specified: Secondary | ICD-10-CM | POA: Insufficient documentation

## 2013-09-16 DIAGNOSIS — S8990XA Unspecified injury of unspecified lower leg, initial encounter: Secondary | ICD-10-CM | POA: Diagnosis present

## 2013-09-16 DIAGNOSIS — T07XXXA Unspecified multiple injuries, initial encounter: Secondary | ICD-10-CM | POA: Insufficient documentation

## 2013-09-16 DIAGNOSIS — S298XXA Other specified injuries of thorax, initial encounter: Secondary | ICD-10-CM | POA: Insufficient documentation

## 2013-09-16 DIAGNOSIS — S99919A Unspecified injury of unspecified ankle, initial encounter: Secondary | ICD-10-CM | POA: Diagnosis present

## 2013-09-16 MED ORDER — HYDROCODONE-ACETAMINOPHEN 7.5-325 MG/15ML PO SOLN: ORAL | Status: DC

## 2013-09-16 MED ORDER — HYDROCODONE-ACETAMINOPHEN 7.5-325 MG/15ML PO SOLN
10.0000 mL | Freq: Once | ORAL | Status: AC
  Administered 2013-09-16: 10 mL via ORAL
  Filled 2013-09-16: qty 15

## 2013-09-16 NOTE — Progress Notes (Signed)
Orthopedic Tech Progress Note Patient Details:  Jamie [name] 08/16/1990 [id number] Crutches fit for height and comfort. Ace wrap applied to RLE. Ortho Devices Type of Ortho Device: Crutches;Ace wrap Ortho Device/Splint Location: RLE Ortho Device/Splint Interventions: Application   Asia R Thompson 09/16/2013, 5:05 PM

## 2013-09-16 NOTE — ED Notes (Signed)
Pt now complaining of pain to right ankle.
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
## 2013-09-30 ENCOUNTER — Ambulatory Visit: Payer: No Typology Code available for payment source | Attending: Family Medicine

## 2013-09-30 DIAGNOSIS — IMO0001 Reserved for inherently not codable concepts without codable children: Secondary | ICD-10-CM | POA: Insufficient documentation

## 2013-09-30 DIAGNOSIS — M542 Cervicalgia: Secondary | ICD-10-CM | POA: Insufficient documentation

## 2013-09-30 DIAGNOSIS — M2560 Stiffness of unspecified joint, not elsewhere classified: Secondary | ICD-10-CM | POA: Insufficient documentation

## 2013-09-30 DIAGNOSIS — R5381 Other malaise: Secondary | ICD-10-CM | POA: Insufficient documentation

## 2013-10-05 ENCOUNTER — Ambulatory Visit: Payer: Medicaid Other | Admitting: Obstetrics

## 2013-10-05 ENCOUNTER — Ambulatory Visit: Payer: No Typology Code available for payment source | Attending: Family Medicine

## 2013-10-05 DIAGNOSIS — M542 Cervicalgia: Secondary | ICD-10-CM | POA: Diagnosis not present

## 2013-10-05 DIAGNOSIS — M2560 Stiffness of unspecified joint, not elsewhere classified: Secondary | ICD-10-CM | POA: Insufficient documentation

## 2013-10-05 DIAGNOSIS — IMO0001 Reserved for inherently not codable concepts without codable children: Secondary | ICD-10-CM | POA: Diagnosis present

## 2013-10-05 DIAGNOSIS — R5381 Other malaise: Secondary | ICD-10-CM | POA: Diagnosis not present

## 2013-10-12 ENCOUNTER — Encounter: Payer: Medicaid Other | Admitting: Physical Therapy

## 2013-10-13 ENCOUNTER — Ambulatory Visit: Payer: No Typology Code available for payment source | Admitting: Physical Therapy

## 2013-10-13 DIAGNOSIS — IMO0001 Reserved for inherently not codable concepts without codable children: Secondary | ICD-10-CM | POA: Diagnosis not present

## 2013-10-14 ENCOUNTER — Ambulatory Visit: Payer: No Typology Code available for payment source | Admitting: Physical Therapy

## 2013-10-20 ENCOUNTER — Ambulatory Visit: Payer: No Typology Code available for payment source

## 2013-10-20 DIAGNOSIS — IMO0001 Reserved for inherently not codable concepts without codable children: Secondary | ICD-10-CM | POA: Diagnosis not present

## 2013-10-28 ENCOUNTER — Ambulatory Visit: Payer: No Typology Code available for payment source | Admitting: Physical Therapy

## 2013-10-28 DIAGNOSIS — IMO0001 Reserved for inherently not codable concepts without codable children: Secondary | ICD-10-CM | POA: Diagnosis not present

## 2013-10-31 ENCOUNTER — Ambulatory Visit: Payer: No Typology Code available for payment source | Admitting: Physical Therapy

## 2013-11-02 ENCOUNTER — Ambulatory Visit: Payer: No Typology Code available for payment source | Attending: Family Medicine | Admitting: Physical Therapy

## 2013-11-02 DIAGNOSIS — R5381 Other malaise: Secondary | ICD-10-CM | POA: Diagnosis not present

## 2013-11-02 DIAGNOSIS — IMO0001 Reserved for inherently not codable concepts without codable children: Secondary | ICD-10-CM | POA: Diagnosis present

## 2013-11-02 DIAGNOSIS — M542 Cervicalgia: Secondary | ICD-10-CM | POA: Insufficient documentation

## 2013-11-02 DIAGNOSIS — M2560 Stiffness of unspecified joint, not elsewhere classified: Secondary | ICD-10-CM | POA: Diagnosis not present

## 2013-11-11 ENCOUNTER — Ambulatory Visit: Payer: No Typology Code available for payment source | Admitting: Physical Therapy

## 2013-11-11 DIAGNOSIS — IMO0001 Reserved for inherently not codable concepts without codable children: Secondary | ICD-10-CM | POA: Diagnosis not present

## 2013-11-18 ENCOUNTER — Ambulatory Visit: Payer: No Typology Code available for payment source | Admitting: Physical Therapy

## 2013-11-18 DIAGNOSIS — IMO0001 Reserved for inherently not codable concepts without codable children: Secondary | ICD-10-CM | POA: Diagnosis not present

## 2013-11-24 ENCOUNTER — Ambulatory Visit: Payer: No Typology Code available for payment source

## 2013-11-24 DIAGNOSIS — IMO0001 Reserved for inherently not codable concepts without codable children: Secondary | ICD-10-CM | POA: Diagnosis not present

## 2013-11-25 ENCOUNTER — Ambulatory Visit: Payer: No Typology Code available for payment source | Admitting: Physical Therapy

## 2013-11-25 DIAGNOSIS — IMO0001 Reserved for inherently not codable concepts without codable children: Secondary | ICD-10-CM | POA: Diagnosis not present

## 2013-12-27 ENCOUNTER — Ambulatory Visit (INDEPENDENT_AMBULATORY_CARE_PROVIDER_SITE_OTHER): Payer: Self-pay | Admitting: Physician Assistant

## 2013-12-27 VITALS — BP 106/72 | HR 73 | Temp 98.4°F | Resp 16 | Ht 69.5 in | Wt 254.0 lb

## 2013-12-27 DIAGNOSIS — Z23 Encounter for immunization: Secondary | ICD-10-CM

## 2013-12-27 DIAGNOSIS — Z1159 Encounter for screening for other viral diseases: Secondary | ICD-10-CM

## 2013-12-27 NOTE — Progress Notes (Signed)
   Subjective:    Patient ID: Jamie [name], female    DOB: 11/18/1990, 23 y.o.   MRN: [id number]  HPI 23 year old female presents for hepatitis B immunization . 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 Had a negative titer when she was pregnant, and was instructed to repeat series.  Admits she did have the series when she was a child.  Patient is healthy with no other concerns today.     Review of Systems  Constitutional: Negative for fever and chills.  Respiratory: Negative for cough.   Gastrointestinal: Negative for nausea and vomiting.  Neurological: Negative for headaches.       Objective:   Physical Exam  Constitutional: She is oriented to person, place, and time. She appears well-developed and well-nourished.  HENT:  Head: Normocephalic and atraumatic.  Right Ear: External ear normal.  Left Ear: External ear normal.  Eyes: Conjunctivae are normal.  Neck: Normal range of motion.  Cardiovascular: Normal rate.   Pulmonary/Chest: Effort normal.  Neurological: She is alert and oriented to person, place, and time.  Psychiatric: She has a normal mood and affect. Her behavior is normal. Judgment and thought content normal.          Assessment & Plan:  Need for prophylactic vaccination and inoculation against viral hepatitis - Plan: Hepatitis B vaccine adult IM  Need for hepatitis B screening test - Plan: Hepatitis B surface antibody  Plan to repeat series then check titer 1-2 months after completion. Future order placed to have this done so she may come in for a lab only visit at that time.

## 2014-07-07 ENCOUNTER — Encounter: Payer: Self-pay | Admitting: Obstetrics

## 2014-07-07 ENCOUNTER — Ambulatory Visit (INDEPENDENT_AMBULATORY_CARE_PROVIDER_SITE_OTHER): Payer: Medicaid Other | Admitting: Obstetrics

## 2014-07-07 VITALS — BP 112/73 | HR 80 | Wt 250.0 lb

## 2014-07-07 DIAGNOSIS — R399 Unspecified symptoms and signs involving the genitourinary system: Secondary | ICD-10-CM

## 2014-07-07 LAB — POCT URINALYSIS DIPSTICK
Bilirubin, UA: NEGATIVE
Blood, UA: NEGATIVE
Glucose, UA: NEGATIVE
Ketones, UA: NEGATIVE
LEUKOCYTES UA: NEGATIVE
Nitrite, UA: NEGATIVE
Spec Grav, UA: 1.01
Urobilinogen, UA: NEGATIVE
pH, UA: 6.5

## 2014-07-07 MED ORDER — CIPROFLOXACIN HCL 500 MG PO TABS: 500.0000 mg | ORAL_TABLET | Freq: Two times a day (BID) | ORAL | Status: DC

## 2014-07-07 NOTE — Progress Notes (Signed)
Patient not seen by physician. 

## 2014-09-21 ENCOUNTER — Ambulatory Visit: Payer: Medicaid Other | Admitting: Obstetrics

## 2014-12-12 ENCOUNTER — Other Ambulatory Visit: Payer: Self-pay | Admitting: Obstetrics

## 2014-12-12 ENCOUNTER — Encounter: Payer: Self-pay | Admitting: Obstetrics

## 2014-12-12 ENCOUNTER — Ambulatory Visit (INDEPENDENT_AMBULATORY_CARE_PROVIDER_SITE_OTHER): Payer: Commercial Managed Care - HMO | Admitting: Obstetrics

## 2014-12-12 VITALS — BP 104/71 | HR 72 | Temp 98.0°F | Ht 69.0 in | Wt 250.0 lb

## 2014-12-12 DIAGNOSIS — A499 Bacterial infection, unspecified: Secondary | ICD-10-CM | POA: Diagnosis not present

## 2014-12-12 DIAGNOSIS — R21 Rash and other nonspecific skin eruption: Secondary | ICD-10-CM | POA: Diagnosis not present

## 2014-12-12 DIAGNOSIS — N393 Stress incontinence (female) (male): Secondary | ICD-10-CM | POA: Diagnosis not present

## 2014-12-12 DIAGNOSIS — N760 Acute vaginitis: Secondary | ICD-10-CM | POA: Diagnosis not present

## 2014-12-12 DIAGNOSIS — Z01419 Encounter for gynecological examination (general) (routine) without abnormal findings: Secondary | ICD-10-CM | POA: Diagnosis not present

## 2014-12-12 DIAGNOSIS — Z30431 Encounter for routine checking of intrauterine contraceptive device: Secondary | ICD-10-CM | POA: Diagnosis not present

## 2014-12-12 DIAGNOSIS — Z124 Encounter for screening for malignant neoplasm of cervix: Secondary | ICD-10-CM

## 2014-12-12 DIAGNOSIS — B9689 Other specified bacterial agents as the cause of diseases classified elsewhere: Secondary | ICD-10-CM

## 2014-12-12 LAB — CBC
HEMATOCRIT: 37.9 % (ref 36.0–46.0)
Hemoglobin: 13.1 g/dL (ref 12.0–15.0)
MCH: 31.1 pg (ref 26.0–34.0)
MCHC: 34.6 g/dL (ref 30.0–36.0)
MCV: 90 fL (ref 78.0–100.0)
MPV: 10 fL (ref 8.6–12.4)
Platelets: 305 10*3/uL (ref 150–400)
RBC: 4.21 MIL/uL (ref 3.87–5.11)
RDW: 13.7 % (ref 11.5–15.5)
WBC: 5 10*3/uL (ref 4.0–10.5)

## 2014-12-12 LAB — COMPREHENSIVE METABOLIC PANEL
ALK PHOS: 63 U/L (ref 33–115)
ALT: 10 U/L (ref 6–29)
AST: 16 U/L (ref 10–30)
Albumin: 4.1 g/dL (ref 3.6–5.1)
BILIRUBIN TOTAL: 0.4 mg/dL (ref 0.2–1.2)
BUN: 11 mg/dL (ref 7–25)
CHLORIDE: 103 mmol/L (ref 98–110)
CO2: 27 mmol/L (ref 20–31)
CREATININE: 0.73 mg/dL (ref 0.50–1.10)
Calcium: 8.8 mg/dL (ref 8.6–10.2)
Glucose, Bld: 84 mg/dL (ref 65–99)
Potassium: 4.1 mmol/L (ref 3.5–5.3)
Sodium: 138 mmol/L (ref 135–146)
Total Protein: 7.5 g/dL (ref 6.1–8.1)

## 2014-12-12 LAB — TSH: TSH: 4.906 u[IU]/mL — AB (ref 0.350–4.500)

## 2014-12-12 MED ORDER — FLUOCINONIDE-E 0.05 % EX CREA: 1.0000 "application " | TOPICAL_CREAM | Freq: Two times a day (BID) | CUTANEOUS | Status: DC

## 2014-12-12 MED ORDER — METRONIDAZOLE 500 MG PO TABS: 500.0000 mg | ORAL_TABLET | Freq: Two times a day (BID) | ORAL | Status: DC

## 2014-12-12 NOTE — Addendum Note (Signed)
Addended by: Marya Landry D on: 12/12/2014 02:27 PM   Modules accepted: Orders

## 2014-12-12 NOTE — Progress Notes (Signed)
Subjective:        Jamie [name] is a 24 y.o. female here for a routine exam.  Current complaints: Rash under arms from deoderant and rashes over body for past 3 months.  Also has malodorous vaginal discharge, no itching.  Leaks urine with coughing, sneezing, etc.  Has dizziness off and on since January 2016.  Pleased with Mirena [name].    Personal health questionnaire:  Is patient Jamie [name], have a family history of breast and/or ovarian cancer: no Is there a family history of uterine cancer diagnosed at age < 52, gastrointestinal cancer, urinary tract cancer, family member who is a Personnel officer syndrome-associated carrier: no Is the patient overweight and hypertensive, family hHas dizziness off and onistory of diabetes, personal history of gestational diabetes, preeclampsia or PCOS: no Is patient over 15, have PCOS,  family history of premature CHD under age 73, diabetes, smoke, have hypertension or peripheral artery disease:  no At any time, has a partner hit, kicked or otherwise hurt or frightened you?: no Over the past 2 weeks, have you felt down, depressed or hopeless?: no Over the past 2 weeks, have you felt little interest or pleasure in doing things?:no   Gynecologic History No LMP recorded. Patient is not currently having periods (Reason: [name]). Contraception: [name] Last Pap: 2014. Results were: normal Last mammogram: n/a. Results were: n/a  Obstetric History OB History  Gravida Para Term Preterm AB SAB TAB Ectopic Multiple Living  1 1 1       1     # Outcome Date GA Lbr Len/2nd Weight Sex Delivery Anes PTL Lv  1 Term 05/22/13 [redacted]w[redacted]d 09:35 / 00:18 7 lb 4.8 oz (3.311 kg) M Vag-Spont None  Y      History reviewed. No pertinent past medical history.  History reviewed. No pertinent past surgical history.   Current outpatient prescriptions:  .  ciprofloxacin (CIPRO) 500 MG tablet, Take 1 tablet (500 mg total) by mouth 2 (two) times daily. (Patient not taking: Reported on  12/12/2014), Disp: 14 tablet, Rfl: 2 .  fluocinonide-emollient (LIDEX-E) 0.05 % cream, Apply 1 application topically 2 (two) times daily., Disp: 60 g, Rfl: 2 .  metroNIDAZOLE (FLAGYL) 500 MG tablet, Take 1 tablet (500 mg total) by mouth 2 (two) times daily., Disp: 14 tablet, Rfl: 2 Allergies  Allergen Reactions  . Shellfish Allergy Swelling    Throat     History  Substance Use Topics  . Smoking status: Never Smoker   . Smokeless tobacco: Never Used  . Alcohol Use: No    Family History  Problem Relation Age of Onset  . Diabetes Father   . Stroke Father   . Heart disease Father   . Diabetes Maternal Grandfather   . Heart disease Maternal Grandfather   . Hypertension Maternal Grandfather   . Heart attack Paternal Grandmother       Review of Systems  Constitutional: negative for fatigue and weight loss Respiratory: negative for cough and wheezing Cardiovascular: negative for chest pain, fatigue and palpitations Gastrointestinal: negative for abdominal pain and change in bowel habits Musculoskeletal:negative for myalgias Neurological: negative for gait problems and tremors Behavioral/Psych: negative for abusive relationship, depression Endocrine: negative for temperature intolerance   Genitourinary:negative for abnormal menstrual periods, genital lesions, hot flashes, sexual problems.  Positive for malodorous vaginal discharge and leaking urine Integument/breast: negative for breast lump, breast tenderness, nipple discharge.  Positive for skin lesion(s)    Objective:       BP 104/71 mmHg  Pulse 72  Temp(Src) 98 F (36.7 C)  Ht  (1.753 m)  Wt 250 lb (113.399 kg)  BMI 36.90 kg/m2 General:   alert  Skin:   rash on chest and under arms  Lungs:   clear to auscultation bilaterally  Heart:   regular rate and rhythm, S1, S2 normal, no murmur, click, rub or gallop  Breasts:   normal without suspicious masses, skin or nipple changes or axillary nodes  Abdomen:  normal  findings: no organomegaly, soft, non-tender and no hernia  Pelvis:  External genitalia: normal general appearance Urinary system: urethral meatus normal and bladder without fullness, nontender Vaginal: normal without tenderness, induration or masses Cervix: normal appearance Adnexa: normal bimanual exam Uterus: anteverted and non-tender, normal size   Lab Review Urine pregnancy test Labs reviewed yes Radiologic studies reviewed no    Assessment:    Healthy female exam.    BV  SUI  Rash.  Probable contact dermatitis  Contraceptive management.  Pleased with Mirena [name]    Plan:   Flagyl Rx for BV Kegel exercises recommended for SUI.  Urine Cx sent Lidex cream Rx for rashes    Education reviewed: calcium supplements, low fat, low cholesterol diet, safe sex/STD prevention, self breast exams and weight bearing exercise. Contraception: [name]. Follow up in: 1 year.   Meds ordered this encounter  Medications  . metroNIDAZOLE (FLAGYL) 500 MG tablet    Sig: Take 1 tablet (500 mg total) by mouth 2 (two) times daily.    Dispense:  14 tablet    Refill:  2  . fluocinonide-emollient (LIDEX-E) 0.05 % cream    Sig: Apply 1 application topically 2 (two) times daily.    Dispense:  60 g    Refill:  2   Orders Placed This Encounter  Procedures  . Comprehensive metabolic panel  . TSH  . CBC

## 2014-12-12 NOTE — Patient Instructions (Signed)
Kegel Exercises The goal of Kegel exercises is to isolate and exercise your pelvic floor muscles. These muscles act as a hammock that supports the rectum, vagina, small intestine, and uterus. As the muscles weaken, the hammock sags and these organs are displaced from their normal positions. Kegel exercises can strengthen your pelvic floor muscles and help you to improve bladder and bowel control, improve sexual response, and help reduce many problems and some discomfort during pregnancy. Kegel exercises can be done anywhere and at any time. HOW TO PERFORM KEGEL EXERCISES  Locate your pelvic floor muscles. To do this, squeeze (contract) the muscles that you use when you try to stop the flow of urine. You will feel a tightness in the vaginal area (women) and a tight lift in the rectal area (men and women).  When you begin, contract your pelvic muscles tight for 2-5 seconds, then relax them for 2-5 seconds. This is one set. Do 4-5 sets with a short pause in between.  Contract your pelvic muscles for 8-10 seconds, then relax them for 8-10 seconds. Do 4-5 sets. If you cannot contract your pelvic muscles for 8-10 seconds, try 5-7 seconds and work your way up to 8-10 seconds. Your goal is 4-5 sets of 10 contractions each day. Keep your stomach, buttocks, and legs relaxed during the exercises. Perform sets of both short and long contractions. Vary your positions. Perform these contractions 3-4 times per day. Perform sets while you are:   Lying in bed in the morning.  Standing at lunch.  Sitting in the late afternoon.  Lying in bed at night. You should do 40-50 contractions per day. Do not perform more Kegel exercises per day than recommended. Overexercising can cause muscle fatigue. Continue these exercises for for at least 15-20 weeks or as directed by your caregiver. Document Released: 04/07/2012 Document Reviewed: 04/07/2012 [facility] Patient Information 2015 [place], [place]. This information is not  intended to replace advice given to you by your health care provider. Make sure you discuss any questions you have with your health care provider. Bacterial Vaginosis Bacterial vaginosis is a vaginal infection that occurs when the normal balance of bacteria in the vagina is disrupted. It results from an overgrowth of certain bacteria. This is the most common vaginal infection in women of childbearing age. Treatment is important to prevent complications, especially in pregnant women, as it can cause a premature delivery. CAUSES  Bacterial vaginosis is caused by an increase in harmful bacteria that are normally present in smaller amounts in the vagina. Several different kinds of bacteria can cause bacterial vaginosis. However, the reason that the condition develops is not fully understood. RISK FACTORS Certain activities or behaviors can put you at an increased risk of developing bacterial vaginosis, including:  Having a new sex partner or multiple sex partners.  Douching.  Using an intrauterine device ([name]) for contraception. Women do not get bacterial vaginosis from toilet seats, bedding, swimming pools, or contact with objects around them. SIGNS AND SYMPTOMS  Some women with bacterial vaginosis have no signs or symptoms. Common symptoms include:  Grey vaginal discharge.  A fishlike odor with discharge, especially after sexual intercourse.  Itching or burning of the vagina and vulva.  Burning or pain with urination. DIAGNOSIS  Your health care provider will take a medical history and examine the vagina for signs of bacterial vaginosis. A sample of vaginal fluid may be taken. Your health care provider will look at this sample under a microscope to check for bacteria and  abnormal cells. A vaginal pH test may also be done.  TREATMENT  Bacterial vaginosis may be treated with antibiotic medicines. These may be given in the form of a pill or a vaginal cream. A second round of antibiotics may be  prescribed if the condition comes back after treatment.  HOME CARE INSTRUCTIONS   Only take over-the-counter or prescription medicines as directed by your health care provider.  If antibiotic medicine was prescribed, take it as directed. Make sure you finish it even if you start to feel better.  Do not have sex until treatment is completed.  Tell all sexual partners that you have a vaginal infection. They should see their health care provider and be treated if they have problems, such as a mild rash or itching.  Practice safe sex by using condoms and only having one sex partner. SEEK MEDICAL CARE IF:   Your symptoms are not improving after 3 days of treatment.  You have increased discharge or pain.  You have a fever. MAKE SURE YOU:   Understand these instructions.  Will watch your condition.  Will get help right away if you are not doing well or get worse. FOR MORE INFORMATION  Centers for Disease Control and Prevention, Division of STD Prevention: [employer] American Sexual Health Association (ASHA): www.ashastd.org  Document Released: 04/21/2005 Document Revised: 02/09/2013 Document Reviewed: 12/01/2012 [facility] Patient Information 2015 [place], [place]. This information is not intended to replace advice given to you by your health care provider. Make sure you discuss any questions you have with your health care provider.

## 2014-12-13 ENCOUNTER — Other Ambulatory Visit: Payer: Self-pay | Admitting: Obstetrics

## 2014-12-13 DIAGNOSIS — R7989 Other specified abnormal findings of blood chemistry: Secondary | ICD-10-CM

## 2014-12-13 LAB — PAP IG W/ RFLX HPV ASCU

## 2014-12-14 ENCOUNTER — Telehealth: Payer: Self-pay | Admitting: *Deleted

## 2014-12-14 NOTE — Telephone Encounter (Signed)
Patient called requesting lab results. Results reviewed with patient.

## 2014-12-15 ENCOUNTER — Other Ambulatory Visit: Payer: Self-pay | Admitting: Obstetrics

## 2014-12-15 DIAGNOSIS — B9689 Other specified bacterial agents as the cause of diseases classified elsewhere: Secondary | ICD-10-CM

## 2014-12-15 DIAGNOSIS — N760 Acute vaginitis: Principal | ICD-10-CM

## 2014-12-15 LAB — THYROID PROFILE - CHCC
Free Thyroxine Index: 1.7 (ref 1.4–3.8)
T3 UPTAKE: 28 % (ref 22–35)
T4, Total: 6.1 ug/dL (ref 4.5–12.0)

## 2014-12-15 LAB — SURESWAB, VAGINOSIS/VAGINITIS PLUS
Atopobium vaginae: >8 Log (cells/mL)
C. ALBICANS, DNA: NOT DETECTED
C. GLABRATA, DNA: NOT DETECTED
C. parapsilosis, DNA: NOT DETECTED
C. trachomatis RNA, TMA: NOT DETECTED
C. tropicalis, DNA: NOT DETECTED
Gardnerella vaginalis: >8 Log (cells/mL)
LACTOBACILLUS SPECIES: NOT DETECTED Log (cells/mL)
MEGASPHAERA SPECIES: >8 Log (cells/mL)
N. GONORRHOEAE RNA, TMA: NOT DETECTED
T. vaginalis RNA, QL TMA: NOT DETECTED

## 2014-12-15 MED ORDER — TINIDAZOLE 500 MG PO TABS: 1000.0000 mg | ORAL_TABLET | Freq: Every day | ORAL | Status: DC

## 2015-02-02 ENCOUNTER — Ambulatory Visit (INDEPENDENT_AMBULATORY_CARE_PROVIDER_SITE_OTHER): Payer: Commercial Managed Care - HMO

## 2015-02-02 DIAGNOSIS — Z23 Encounter for immunization: Secondary | ICD-10-CM | POA: Diagnosis not present
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
## 2015-07-04 ENCOUNTER — Ambulatory Visit: Payer: PRIVATE HEALTH INSURANCE | Attending: Family Medicine

## 2015-07-04 DIAGNOSIS — M25511 Pain in right shoulder: Secondary | ICD-10-CM | POA: Diagnosis present

## 2015-07-04 DIAGNOSIS — R6889 Other general symptoms and signs: Secondary | ICD-10-CM | POA: Diagnosis present

## 2015-07-04 DIAGNOSIS — M546 Pain in thoracic spine: Secondary | ICD-10-CM | POA: Diagnosis not present

## 2015-07-04 DIAGNOSIS — M542 Cervicalgia: Secondary | ICD-10-CM | POA: Diagnosis present

## 2015-07-04 DIAGNOSIS — R293 Abnormal posture: Secondary | ICD-10-CM | POA: Diagnosis present
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
## 2015-07-04 NOTE — Patient Instructions (Signed)
Instructed in proper sitting posture with back support and UE support  And posterior neck elongation

## 2015-07-10 ENCOUNTER — Ambulatory Visit: Payer: PRIVATE HEALTH INSURANCE | Admitting: Physical Therapy

## 2015-07-10 DIAGNOSIS — R6889 Other general symptoms and signs: Secondary | ICD-10-CM

## 2015-07-10 DIAGNOSIS — M25511 Pain in right shoulder: Secondary | ICD-10-CM

## 2015-07-10 DIAGNOSIS — M542 Cervicalgia: Secondary | ICD-10-CM

## 2015-07-10 DIAGNOSIS — M546 Pain in thoracic spine: Secondary | ICD-10-CM

## 2015-07-10 DIAGNOSIS — R293 Abnormal posture: Secondary | ICD-10-CM

## 2015-07-10 NOTE — Therapy (Signed)
[facility] Outpatient Rehabilitation [facility] [address] [place], [place], [address] Phone: [phone]   Fax:  [phone]  Physical Therapy Treatment  Patient Details  Name: Jamie [name] MRN: [id number] Date of Birth: [birth year] Referring Provider: Hewitt Shorts Fulp. MD  Encounter Date: 07/10/2015      PT End of Session - 07/10/15 1419    Visit Number 2   Number of Visits 12   Date for PT Re-Evaluation 08/15/15   Authorization Type Generic commercial   PT Start Time 0215   PT Stop Time 0315   PT Time Calculation (min) 60 min      No past medical history on file.  No past surgical history on file.  There were no vitals filed for this visit.  Visit Diagnosis:  Bilateral thoracic back pain  Activity intolerance  Abnormal posture  Right shoulder pain  Neck pain      Subjective Assessment - 07/10/15 1419    Subjective I am in alot of pain today. Lower back, between shoulder blades and right knee. I went up and down the stairs alot yesterday. I was fine yesterday.    Currently in Pain? Yes   Pain Score 9    Pain Location Back  and right knee   Pain Orientation Lower   Pain Descriptors / Indicators --  pinching knee, spasm/pinch in upper traps and shoulder blades                         [facility] Adult PT Treatment/Exercise - 07/10/15 0001    Neck Exercises: Seated   Postural Training scap squeeze 10 x 5 sec   Other Seated Exercise rhomboid stretch at pole- pt dislikes   Lumbar Exercises: Stretches   Active Hamstring Stretch 3 reps;30 seconds   Single Knee to Chest Stretch 3 reps;30 seconds   Lower Trunk Rotation 10 seconds;5 reps   Modalities   Modalities Electrical Stimulation;Moist Heat   Moist Heat Therapy   Number Minutes Moist Heat 15 Minutes   Moist Heat Location Cervical  thoracic   Electrical Stimulation   Electrical Stimulation Location upper traps and rhomboids   Electrical Stimulation Action IFC x 15 min    Electrical Stimulation Parameters 7 mA   Electrical Stimulation Goals Pain   Neck Exercises: Stretches   Upper Trapezius Stretch 3 reps;30 seconds   Levator Stretch 3 reps;30 seconds                  PT Short Term Goals - 07/04/15 1424    PT SHORT TERM GOAL #1   Title She will be independent with inital HEP   Time 3   Period Weeks   Status New   PT SHORT TERM GOAL #2   Title Shew ill report pain in upper back decr 25% or more   Time 3   Period Weeks   Status New           PT Long Term Goals - 07/04/15 1424    PT LONG TERM GOAL #1   Title She will be independent in all HEP issued as of last visit   Time 6   Period Weeks   Status New   PT LONG TERM GOAL #2   Title She will report  able to  perform house work  with 1-3/10 max pain   Time 6   Period Weeks   Status New   PT LONG TERM GOAL #3   Title She will  report pain in upper back decr 75% and become intermittant   Time 6   Period Weeks   Status New   PT LONG TERM GOAL #4   Title She will report able to lift child with only mild pain   Time 6   Period Weeks   Status New               Plan - 07/10/15 1508    Clinical Impression Statement Pt presents with increased pain right knee, cervical, thoracic and lumbar spine. Instructed pt in trunk mobility stretches as well as upper trap and levator stretches. Cues to keep comfortable, Trial of scap squeezes and rhomboid stretch with pt reporting increased pain. IFC and HMP at end of session for pain relief.    PT Next Visit Plan Taping, manual, stretching, modalities, assess benefit of this treatment- add to HEP         Problem List Patient Active Problem List   Diagnosis Date Noted  . Active labor 05/22/2013  . Normal delivery 05/22/2013  . Supervision of normal first pregnancy 02/04/2013    Sherrie Mustache, PTA 07/10/2015, 3:11 PM  [facility] [address] [place], [place],  [address] Phone: [phone]   Fax:  [phone]  Name: Jamie [name] MRN: [id number] Date of Birth: 1990/10/10

## 2015-07-17 ENCOUNTER — Ambulatory Visit: Payer: PRIVATE HEALTH INSURANCE

## 2015-07-17 DIAGNOSIS — M546 Pain in thoracic spine: Secondary | ICD-10-CM | POA: Diagnosis not present

## 2015-07-17 DIAGNOSIS — R6889 Other general symptoms and signs: Secondary | ICD-10-CM

## 2015-07-17 DIAGNOSIS — M25511 Pain in right shoulder: Secondary | ICD-10-CM

## 2015-07-17 DIAGNOSIS — M542 Cervicalgia: Secondary | ICD-10-CM

## 2015-07-17 DIAGNOSIS — R293 Abnormal posture: Secondary | ICD-10-CM

## 2015-07-17 NOTE — Therapy (Signed)
[facility] Outpatient Rehabilitation [facility] [address] [place], [place], [address] Phone: [phone]   Fax:  [phone]  Physical Therapy Treatment  Patient Details  Name: Jamie [name] MRN: [id number] Date of Birth: October 03, 1990 Referring Provider: Hewitt Shorts Fulp. MD  Encounter Date: 07/17/2015      PT End of Session - 07/17/15 1231    Visit Number 3   Number of Visits 12   Date for PT Re-Evaluation 08/15/15   PT Start Time 1146   PT Stop Time 1236   PT Time Calculation (min) 50 min   Activity Tolerance Patient tolerated treatment well;Patient limited by pain   Behavior During Therapy [facility] for tasks assessed/performed      No past medical history on file.  No past surgical history on file.  There were no vitals filed for this visit.  Visit Diagnosis:  Bilateral thoracic back pain  Activity intolerance  Abnormal posture  Right shoulder pain  Neck pain      Subjective Assessment - 07/17/15 1149    Subjective Doing better than before . Purchased home TENS and it is helpful.    Currently in Pain? Yes   Pain Score 7    Pain Location Back  thoracic and shoulder   Pain Orientation Upper   Pain Descriptors / Indicators Burning  in knee and thoracic   Pain Type Chronic pain   Pain Onset More than a month ago   Pain Frequency Constant   Multiple Pain Sites --  knee and upper back                         [facility] Adult PT Treatment/Exercise - 07/17/15 1153    Neck Exercises: Machines for Strengthening   UBE (Upper Arm Bike) 4 min forward 120 RPM   Neck Exercises: Theraband   Other Theraband Exercises Shoulder ER , hor abduction ,  x 10 yellow band x 10 reps, cued for technique in supine   Shoulder Exercises: Sidelying   Other Sidelying Exercises arm openings x 8 RT and LT   Other Sidelying Exercises stretching in standing at bar for posterior shoulder /upper back 1x 30 sec   Manual Therapy   Manual Therapy Taping;Joint  mobilization   Kinesiotex Create Space;Inhibit Muscle   Kinesiotix   Inhibit Muscle  Y to RT and LT scapula and 2 stips in araspinals for decreased pain ans spasms.       HMP x 10 min post to whole back          PT Education - 07/17/15 1230    Education provided Yes   Education Details tape management, arm openings   Person(s) Educated Patient   Methods Explanation;Tactile cues;Verbal cues;Demonstration;Handout   Comprehension Returned demonstration;Verbalized understanding          PT Short Term Goals - 07/17/15 1232    PT SHORT TERM GOAL #1   Title She will be independent with inital HEP   Status On-going   PT SHORT TERM GOAL #2   Title She will report pain in upper back decr 25% or more   Status On-going           PT Long Term Goals - 07/04/15 1424    PT LONG TERM GOAL #1   Title She will be independent in all HEP issued as of last visit   Time 6   Period Weeks   Status New   PT LONG TERM GOAL #2   Title  She will report  able to  perform house work  with 1-3/10 max pain   Time 6   Period Weeks   Status New   PT LONG TERM GOAL #3   Title She will report pain in upper back decr 75% and become intermittant   Time 6   Period Weeks   Status New   PT LONG TERM GOAL #4   Title She will report able to lift child with only mild pain   Time 6   Period Weeks   Status New               Plan - 07/17/15 1231    Clinical Impression Statement No changes though improved from last visit. She is to see MD about RT knee. She was able to do all exercises but all with pain.   PT Next Visit Plan Taping, manual, stretching, modalities, assess benefit of this treatment- add to HEP    PT Home Exercise Plan arm openings   Consulted and Agree with Plan of Care Patient        Problem List Patient Active Problem List   Diagnosis Date Noted  . Active labor 05/22/2013  . Normal delivery 05/22/2013  . Supervision of normal first pregnancy 02/04/2013     Caprice Red PT 07/17/2015, 12:34 PM  [facility] Outpatient Rehabilitation [facility] [address] [place], [place], [address] Phone: [phone]   Fax:  [phone]  Name: Jamie [name] MRN: [id number] Date of Birth: 1990-11-20

## 2015-07-17 NOTE — Patient Instructions (Signed)
Tape management to remove if irritating skin and to keep on if helpful and can get tape wet/shower as needed. Arm openings x 6-10 reps RT and LT  2x/day

## 2015-07-19 ENCOUNTER — Ambulatory Visit: Payer: PRIVATE HEALTH INSURANCE | Admitting: Physical Therapy

## 2015-07-24 ENCOUNTER — Ambulatory Visit: Payer: PRIVATE HEALTH INSURANCE | Admitting: Physical Therapy

## 2015-07-26 ENCOUNTER — Ambulatory Visit: Payer: PRIVATE HEALTH INSURANCE | Admitting: Physical Therapy

## 2015-07-31 ENCOUNTER — Ambulatory Visit: Payer: PRIVATE HEALTH INSURANCE

## 2015-07-31 DIAGNOSIS — M546 Pain in thoracic spine: Secondary | ICD-10-CM | POA: Diagnosis not present

## 2015-07-31 DIAGNOSIS — R6889 Other general symptoms and signs: Secondary | ICD-10-CM

## 2015-07-31 DIAGNOSIS — M542 Cervicalgia: Secondary | ICD-10-CM

## 2015-07-31 DIAGNOSIS — M25511 Pain in right shoulder: Secondary | ICD-10-CM

## 2015-07-31 DIAGNOSIS — R293 Abnormal posture: Secondary | ICD-10-CM

## 2015-07-31 NOTE — Therapy (Addendum)
[facility] [address] [place], [place], [address] Phone: [phone]   Fax:  [phone]  Physical Therapy Treatment  Patient Details  Name: Janace ArisJete Mistry MRN: [id number] Date of Birth: 1990-11-06 Referring Provider: Hewitt Shortsammie Fulp. MD  Encounter Date: 07/31/2015      PT End of Session - 07/31/15 1241    Visit Number 4   Number of Visits 12   Date for PT Re-Evaluation 08/15/15   PT Start Time 1230   PT Stop Time 1330   PT Time Calculation (min) 60 min   Activity Tolerance Patient limited by pain   Behavior [facility] Therapy [facility] for tasks assessed/performed      No past medical history on file.  No past surgical history on file.  There were no vitals filed for this visit.  Visit Diagnosis:  Bilateral thoracic back pain  Activity intolerance  Abnormal posture  Right shoulder pain  Neck pain      Subjective Assessment - 07/31/15 1235    Subjective HAven't benn able to be here consistently due to son's illness. (I discussed need to be here to have any cumulative effect on pain an dif she needs to choose to be home with sone she may need to postpone PT until she cane come consistently. Tape was ok for 2 hours then did not help after that   Currently in Pain? Yes   Pain Score 7    Pain Location Back  Shoulder   Pain Orientation Upper   Pain Descriptors / Indicators Burning   Pain Type Chronic pain   Pain Onset More than a month ago   Pain Frequency Constant   Pain Relieving Factors TENS, Meds   Multiple Pain Sites --  knee and upper back [place]/shoulder                         [facility]/Ex[facility] - 07/31/15 1244    Neck Exercises: Machines for Strengthening   UBE (Upper Arm Bike) 5 min forward L1   Neck Exercises: Theraband   Other Theraband Exercises Supine ER , elbow flex /extention, hor abduction yellow x 10 each   Moist Heat Therapy   Number Minutes Moist Heat 15 Minutes   Moist  Heat Location --  neck to lower back prone   Manual Therapy   Manual Therapy Joint mobilization;Soft tissue mobilization;Myofascial release   Joint Mobilization PA glides to  upper and lower Thoracic spine    Soft tissue mobilization Manually and with ROCK tool to thoracic spine, rhomboids and lower traps                PT Education - 07/31/15 1313    Education provided Yes   Education Details continued to encourage good posture and need for attendance to make improvements and dry needling option next visit   Person(s) Educated Patient   Methods Explanation   Comprehension Verbalized understanding          PT Short Term Goals - 07/31/15 1316    PT SHORT TERM GOAL #1   Title She will be independent with inital HEP   Status Achieved   PT SHORT TERM GOAL #2   Status On-going           PT Long Term Goals - 07/31/15 1316    PT LONG TERM GOAL #1   Title She will be independent in all HEP issued as of last visit   Status On-going  PT LONG TERM GOAL #2   Title She will report  able to  perform house work  with 1-3/10 max pain   Status On-going   PT LONG TERM GOAL #3   Title She will report pain in upper back decr 75% and become intermittant   Status On-going   PT LONG TERM GOAL #4   Title She will report able to lift child with only mild pain   Status On-going               Plan - 07/31/15 1314    Clinical Impression Statement She has not improved and since attendance has been irregular due to son's illness it is not clear if she could make gains. She wasable to demo arm opening correctly   PT Next Visit Plan Continue exercises and manual , possible needling modalities   Consulted and Agree with Plan of Care Patient        Problem List Patient Active Problem List   Diagnosis Date Noted  . Active labor 05/22/2013  . Normal delivery 05/22/2013  . Supervision of normal first pregnancy 02/04/2013    Caprice Red PT 07/31/2015, 1:17 PM  [facility] [address] [place], [place], [address] Phone: [phone]   Fax:  [phone]  Name: Sicilia Killough MRN: [id number] Date of Birth: 1991/01/08

## 2015-08-02 ENCOUNTER — Ambulatory Visit: Payer: PRIVATE HEALTH INSURANCE | Admitting: Physical Therapy

## 2015-08-02 DIAGNOSIS — R293 Abnormal posture: Secondary | ICD-10-CM

## 2015-08-02 DIAGNOSIS — M542 Cervicalgia: Secondary | ICD-10-CM

## 2015-08-02 DIAGNOSIS — R6889 Other general symptoms and signs: Secondary | ICD-10-CM

## 2015-08-02 DIAGNOSIS — M546 Pain in thoracic spine: Secondary | ICD-10-CM

## 2015-08-02 DIAGNOSIS — M25511 Pain in right shoulder: Secondary | ICD-10-CM

## 2015-08-02 NOTE — Therapy (Addendum)
[place], [place], [address] Phone: [phone]   Fax:  [phone]  Physical Therapy Treatment  Patient Details  Name: Jamie [name] MRN: [id number] Date of Birth: 1991-02-25 Referring Provider: Ander Gaster Fulp. MD  Encounter Date: 08/02/2015      PT End of Session - 08/02/15 1223    Visit Number 5   Number of Visits 12   Date for PT Re-Evaluation 08/15/15   PT Start Time 2481   PT Stop Time 1238   PT Time Calculation (min) 53 min   Activity Tolerance Patient tolerated treatment well   Behavior During Therapy [facility] for tasks assessed/performed      No past medical history on file.  No past surgical history on file.  There were no vitals filed for this visit.  Visit Diagnosis:  Bilateral thoracic back pain  Activity intolerance  Abnormal posture  Right shoulder pain  Neck pain      Subjective Assessment - 08/02/15 1148    Subjective " today I am feeling pretty good"    Currently in Pain? Yes   Pain Score 6    Pain Location Thoracic   Pain Orientation Left   Pain Descriptors / Indicators Tightness   Pain Type Chronic pain   Pain Onset More than a month ago   Pain Frequency Constant   Aggravating Factors  picking up items,                          [facility] Adult PT Treatment/Exercise - 08/02/15 0001    Moist Heat Therapy   Number Minutes Moist Heat 10 Minutes   Moist Heat Location Shoulder   Electrical Stimulation   Electrical Stimulation Location upper traps and rhomboids   Electrical Stimulation Action Pre moed   Electrical Stimulation Parameters 10 min L9 for both areas 80-150 frequency   Electrical Stimulation Goals Pain   Manual Therapy   Manual Therapy Myofascial release;Soft tissue mobilization   Joint Mobilization T1-T7 grade 3 P>A mobs    Soft tissue mobilization IASTM of the Rhomboid region and upper trap on the Left   Myofascial Release stretchig and  rolling of the rhomboids and upper traps on the L   Neck Exercises: Stretches   Upper Trapezius Stretch 2 reps;30 seconds   Levator Stretch 2 reps;30 seconds          Trigger Point Dry Needling - 08/02/15 1202    Consent Given? Yes   Education Handout Provided Yes   Muscles Treated Upper Body Rhomboids;Levator scapulae;Upper trapezius   Upper Trapezius Response Twitch reponse elicited;Palpable increased muscle length   Levator Scapulae Response Twitch response elicited;Palpable increased muscle length   Rhomboids Response Palpable increased muscle length;Twitch response elicited              PT Education - 08/02/15 1223    Education provided Yes   Education Details dry needling education including lung field precautions.   Person(s) Educated Patient   Methods Explanation   Comprehension Verbalized understanding          PT Short Term Goals - 07/31/15 1316    PT SHORT TERM GOAL #1   Title She will be independent with inital HEP   Status Achieved   PT SHORT TERM GOAL #2   Status On-going           PT Long Term Goals - 07/31/15 1316    PT LONG TERM GOAL #1  Title She will be independent in all HEP issued as of last visit   Status On-going   PT LONG TERM GOAL #2   Title She will report  able to  perform house work  with 1-3/10 max pain   Status On-going   PT LONG TERM GOAL #3   Title She will report pain in upper back decr 75% and become intermittant   Status On-going   PT LONG TERM GOAL #4   Title She will report able to lift child with only mild pain   Status On-going               Plan - 08/02/15 1224    Clinical Impression Statement Adley reports she is having a better day today with pain at a 6/10. She provided consent for dry needling of the rhomboids and upper trap on the left which she demonstrated multiple twitches and reported relief of pain an tightness following IASTM and myofascial release. utlized E-stim and MHP post session to  decrease soreness from DN, following todays treatment she reported pain dropped to 3/10 .   PT Next Visit Plan assess response to DN, Continue exercises and manual , Modalities PRN   Consulted and Agree with Plan of Care Patient        Problem List Patient Active Problem List   Diagnosis Date Noted  . Active labor 05/22/2013  . Normal delivery 05/22/2013  . Supervision of normal first pregnancy 02/04/2013   Starr Lake PT, DPT, LAT, ATC  08/02/2015  12:38 PM      [facility] [address] [place], [place], [address] Phone: [phone]   Fax:  [phone]  Name: Jamie [name] MRN: [id number] Date of Birth: February 03, 1991    PHYSICAL THERAPY DISCHARGE SUMMARY  Visits from Start of Care: 5  Current functional level related to goals / functional outcomes: Unknown as she did not return after this visit   Remaining deficits: Unknown   Education / Equipment: HEP Plan:                                                    Patient goals were not met. Patient is being discharged due to not returning since the last visit.  ?????    Darrel Hoover, PT                          09/18/15             2:48 PM

## 2015-08-06 ENCOUNTER — Ambulatory Visit: Payer: BLUE CROSS/BLUE SHIELD | Attending: Family Medicine | Admitting: Physical Therapy

## 2015-08-08 ENCOUNTER — Ambulatory Visit: Payer: BLUE CROSS/BLUE SHIELD

## 2015-10-25 ENCOUNTER — Emergency Department (HOSPITAL_COMMUNITY)
Admission: EM | Admit: 2015-10-25 | Discharge: 2015-10-26 | Disposition: A | Discharge status: Home / Self Care | Payer: No Typology Code available for payment source | Attending: Emergency Medicine | Admitting: Emergency Medicine

## 2015-10-25 ENCOUNTER — Encounter (HOSPITAL_COMMUNITY): Payer: Self-pay | Admitting: Emergency Medicine

## 2015-10-25 ENCOUNTER — Emergency Department (HOSPITAL_COMMUNITY): Payer: No Typology Code available for payment source

## 2015-10-25 DIAGNOSIS — S63502A Unspecified sprain of left wrist, initial encounter: Secondary | ICD-10-CM

## 2015-10-25 DIAGNOSIS — Y990 Civilian activity done for income or pay: Secondary | ICD-10-CM | POA: Insufficient documentation

## 2015-10-25 DIAGNOSIS — Y929 Unspecified place or not applicable: Secondary | ICD-10-CM | POA: Insufficient documentation

## 2015-10-25 DIAGNOSIS — S63501A Unspecified sprain of right wrist, initial encounter: Secondary | ICD-10-CM | POA: Insufficient documentation

## 2015-10-25 DIAGNOSIS — Y93H1 Activity, digging, shoveling and raking: Secondary | ICD-10-CM | POA: Insufficient documentation

## 2015-10-25 DIAGNOSIS — X501XXA Overexertion from prolonged static or awkward postures, initial encounter: Secondary | ICD-10-CM | POA: Insufficient documentation

## 2015-10-25 MED ORDER — NAPROXEN 500 MG PO TABS
500.0000 mg | ORAL_TABLET | Freq: Two times a day (BID) | ORAL | Status: DC
Start: 2015-10-25 — End: 2015-12-13

## 2015-10-25 MED ORDER — NAPROXEN 250 MG PO TABS
500.0000 mg | ORAL_TABLET | Freq: Once | ORAL | Status: AC
  Administered 2015-10-25: 500 mg via ORAL
  Filled 2015-10-25: qty 2

## 2015-10-25 NOTE — ED Provider Notes (Signed)
CSN: [id number]     Arrival date & time 10/25/15  2033 History   First MD Initiated Contact with Patient 10/25/15 2310     Chief Complaint  Patient presents with  . Wrist Pain     (Consider location/radiation/quality/duration/timing/severity/associated sxs/prior Treatment) HPI Comments: 25 year old female presents to the emergency department for evaluation of left wrist pain. She states that she works at the [employer] and sometimes needs to [employer] mail. 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 She feels as though she felt a "pop". Pain is aggravated with range of motion. No medications taken prior to arrival. She has no history of injury or fracture to her left wrist.  Patient is a 25 y.o. female presenting with wrist pain.  Wrist Pain This is a new problem. The current episode started today. The problem occurs constantly. The problem has been unchanged. Associated symptoms include arthralgias and joint swelling. Pertinent negatives include no fever or weakness. The symptoms are aggravated by bending. She has tried nothing for the symptoms. The treatment provided no relief.    History reviewed. No pertinent past medical history. History reviewed. No pertinent past surgical history. Family History  Problem Relation Age of Onset  . Diabetes Father   . Stroke Father   . Heart disease Father   . Diabetes Maternal Grandfather   . Heart disease Maternal Grandfather   . Hypertension Maternal Grandfather   . Heart attack Paternal Grandmother    Social History  Substance Use Topics  . Smoking status: Never Smoker   . Smokeless tobacco: Never Used  . Alcohol Use: No   OB History    Gravida Para Term Preterm AB TAB SAB Ectopic Multiple Living   1 1 1       1       Review of Systems  Constitutional: Negative for fever.  Musculoskeletal: Positive for joint swelling and arthralgias.  Neurological: Negative for weakness.  All other systems reviewed and  are negative.   Allergies  Shellfish allergy  Home Medications   Prior to Admission medications   Medication Sig Start Date End Date Taking? Authorizing Provider  ciprofloxacin (CIPRO) 500 MG tablet Take 1 tablet (500 mg total) by mouth 2 (two) times daily. Patient not taking: Reported on 12/12/2014 07/07/14   Brock Badharles A Harper, MD  fluocinonide-emollient (LIDEX-E) 0.05 % cream Apply 1 application topically 2 (two) times daily. 12/12/14   Brock Badharles A Harper, MD  metroNIDAZOLE (FLAGYL) 500 MG tablet Take 1 tablet (500 mg total) by mouth 2 (two) times daily. 12/12/14   Brock Badharles A Harper, MD  naproxen (NAPROSYN) 500 MG tablet Take 1 tablet (500 mg total) by mouth 2 (two) times daily. 10/25/15   Antony MaduraKelly Inda Mcglothen, PA-C  tinidazole (TINDAMAX) 500 MG tablet Take 2 tablets (1,000 mg total) by mouth daily with breakfast. 12/15/14   Brock Badharles A Harper, MD   BP 114/55 mmHg  Pulse 82  Temp(Src) 97.8 F (36.6 C) (Oral)  Resp 20  Ht 5\' 10"  (1.778 m)  Wt 108.863 kg  BMI 34.44 kg/m2  SpO2 100%   Physical Exam  Constitutional: She is oriented to person, place, and time. She appears well-developed and well-nourished. No distress.  HENT:  Head: Normocephalic and atraumatic.  Eyes: Conjunctivae and EOM are normal. No scleral icterus.  Neck: Normal range of motion.  Cardiovascular: Normal rate, regular rhythm and intact distal pulses.   Distal radial pulse 2+ in the left upper extremity.  Pulmonary/Chest: Effort  normal. No respiratory distress.  Musculoskeletal: Normal range of motion.       Left wrist: She exhibits tenderness. She exhibits normal range of motion, no swelling, no effusion, no crepitus and no deformity.       Arms: Neurological: She is alert and oriented to person, place, and time. She exhibits normal muscle tone. Coordination normal.  Sensation to light touch intact in the left hand and digits. Normal grip strength.  Skin: Skin is warm and dry. No rash noted. She is not diaphoretic. No erythema. No  pallor.  Psychiatric: She has a normal mood and affect. Her behavior is normal.  Nursing note and vitals reviewed.   ED Course  Procedures (including critical care time) Labs Review Labs Reviewed - No data to display  Imaging Review Dg Wrist Complete Left  10/25/2015  CLINICAL DATA:  25 year old female with trauma and left wrist pain.- EXAM: LEFT WRIST - COMPLETE 3+ VIEW COMPARISON:  None. FINDINGS: There is no evidence of fracture or dislocation. There is no evidence of arthropathy or other focal bone abnormality. Soft tissues are unremarkable. IMPRESSION: Negative. Electronically Signed   By: Elgie CollardArash  Radparvar M.D.   On: 10/25/2015 23:02   I have personally reviewed and evaluated these images and lab results as part of my medical decision-making.   EKG Interpretation None      MDM   Final diagnoses:  Wrist sprain, left, initial encounter    25 year old female presents to the emergency department for evaluation of left wrist pain. She is neurovascularly intact. Symptoms consistent with wrist sprain. X-ray negative for fracture, dislocation, or bony deformity. Wrist brace provided for stability. Will place on NSAIDs and have advised icing. Referral given to hand specialist for persistent symptoms. Return precautions discussed and provided. Patient agreeable to plan with no unaddressed concerns; discharged in good condition.   Filed Vitals:   10/25/15 2057  BP: 114/55  Pulse: 82  Temp: 97.8 F (36.6 C)  TempSrc: Oral  Resp: 20  Height: 5\' 10"  (1.778 m)  Weight: 108.863 kg  SpO2: 100%     Antony MaduraKelly Elizzie Westergard, PA-C 10/25/15 2354  Doug SouSam Jacubowitz, MD 10/26/15 [phone]

## 2015-10-25 NOTE — Discharge Instructions (Signed)
Wrist Sprain °A wrist sprain is a stretch or tear in the strong, fibrous tissues (ligaments) that connect your wrist bones. The ligaments of your wrist may be easily sprained. There are three types of wrist sprains. °· Grade 1. The ligament is not stretched or torn, but the sprain causes pain. °· Grade 2. The ligament is stretched or partially torn. You may be able to move your wrist, but not very much. °· Grade 3. The ligament or muscle completely tears. You may find it difficult or extremely painful to move your wrist even a little. °CAUSES °Often, wrist sprains are a result of a fall or an injury. The force of the impact causes the fibers of your ligament to stretch too much or tear. Common causes of wrist sprains include: °· Overextending your wrist while catching a ball with your hands. °· Repetitive or strenuous extension or bending of your wrist. °· Landing on your hand during a fall. °RISK FACTORS °· Having previous wrist injuries. °· Playing contact sports, such as boxing or wrestling. °· Participating in activities in which falling is common. °· Having poor wrist strength and flexibility. °SIGNS AND SYMPTOMS °· Wrist pain. °· Wrist tenderness. °· Inflammation or bruising of the wrist area. °· Hearing a "pop" or feeling a tear at the time of the injury. °· Decreased wrist movement due to pain, stiffness, or weakness. °DIAGNOSIS °Your health care provider will examine your wrist. In some cases, an X-ray will be taken to make sure you did not break any bones. If your health care provider thinks that you tore a ligament, he or she may order an MRI of your wrist. °TREATMENT °Treatment involves resting and icing your wrist. You may also need to take pain medicines to help lessen pain and inflammation. Your health care provider may recommend keeping your wrist still (immobilized) with a splint to help your sprain heal. When the splint is no longer necessary, you may need to perform strengthening and stretching  exercises. These exercises help you to regain strength and full range of motion in your wrist. Surgery is not usually needed for wrist sprains unless the ligament completely tears. °HOME CARE INSTRUCTIONS °· Rest your wrist. Do not do things that cause pain. °· Wear your wrist splint as directed by your health care provider. °· Take medicines only as directed by your health care provider. °· To ease pain and swelling, apply ice to the injured area. °¨ Put ice in a plastic bag. °¨ Place a towel between your skin and the bag. °¨ Leave the ice on for 20 minutes, 2-3 times a day. °SEEK MEDICAL CARE IF: °· Your pain, discomfort, or swelling gets worse even with treatment. °· You feel sudden numbness in your hand. °  °This information is not intended to replace advice given to you by your health care provider. Make sure you discuss any questions you have with your health care provider. °  °Document Released: 12/23/2013 Document Reviewed: 12/23/2013 °Elsevier Interactive Patient Education ©2016 [employer] Inc. ° °

## 2015-10-25 NOTE — Progress Notes (Signed)
Orthopedic Tech Progress Note Patient Details:  Jamie [name] Jan 10, 1991 [id number]  Ortho Devices Type of Ortho Device: Velcro wrist forearm splint Ortho Device/Splint Location: lue Ortho Device/Splint Interventions: Ordered, Application   Jamie [name], Jamie [name] 10/25/2015, 11:54 PM

## 2015-10-25 NOTE — ED Notes (Signed)
Pt. reports pain at left wrist injured at work this evening while pulling a rake , pain increases with movement / mild swelling .

## 2015-12-13 ENCOUNTER — Ambulatory Visit (INDEPENDENT_AMBULATORY_CARE_PROVIDER_SITE_OTHER): Payer: Commercial Managed Care - HMO | Admitting: Obstetrics

## 2015-12-13 ENCOUNTER — Encounter: Payer: Self-pay | Admitting: Obstetrics

## 2015-12-13 VITALS — BP 109/72 | HR 80 | Ht 69.0 in | Wt 247.4 lb

## 2015-12-13 DIAGNOSIS — Z124 Encounter for screening for malignant neoplasm of cervix: Secondary | ICD-10-CM

## 2015-12-13 DIAGNOSIS — Z01419 Encounter for gynecological examination (general) (routine) without abnormal findings: Secondary | ICD-10-CM | POA: Diagnosis not present

## 2015-12-13 DIAGNOSIS — Z3049 Encounter for surveillance of other contraceptives: Secondary | ICD-10-CM

## 2015-12-13 DIAGNOSIS — N921 Excessive and frequent menstruation with irregular cycle: Secondary | ICD-10-CM

## 2015-12-13 DIAGNOSIS — N393 Stress incontinence (female) (male): Secondary | ICD-10-CM

## 2015-12-13 DIAGNOSIS — Z30431 Encounter for routine checking of intrauterine contraceptive device: Secondary | ICD-10-CM

## 2015-12-13 DIAGNOSIS — Z975 Presence of (intrauterine) contraceptive device: Secondary | ICD-10-CM

## 2015-12-13 DIAGNOSIS — N939 Abnormal uterine and vaginal bleeding, unspecified: Secondary | ICD-10-CM

## 2015-12-13 NOTE — Progress Notes (Signed)
Patient ID: Jamie [name], female   DOB: [birth year], 25 y.o.   MRN: [id number]   Subjective:        Jamie [name] is a 25 y.o. female here for a routine exam.  Current complaints: Prolonged bleeding with period this past month..    Personal health questionnaire:  Is patient [name], have a family history of breast and/or ovarian cancer: no Is there a family history of uterine cancer diagnosed at age < 63, gastrointestinal cancer, urinary tract cancer, family member who is a Personnel officer syndrome-associated carrier: no Is the patient overweight and hypertensive, family history of diabetes, personal history of gestational diabetes, preeclampsia or PCOS: no Is patient over 50, have PCOS,  family history of premature CHD under age 94, diabetes, smoke, have hypertension or peripheral artery disease:  no At any time, has a partner hit, kicked or otherwise hurt or frightened you?: no Over the past 2 weeks, have you felt down, depressed or hopeless?: no Over the past 2 weeks, have you felt little interest or pleasure in doing things?:no   Gynecologic History Patient's last menstrual period was 11/04/2015 (exact date). Contraception: [name] Last Pap: 2016. Results were: normal Last mammogram: n/a. Results were: n/a  Obstetric History OB History  Gravida Para Term Preterm AB Living  SAB TAB Ectopic Multiple Live Births          1    # Outcome Date GA Lbr Len/2nd Weight Sex Delivery Anes PTL Lv  1 Term 05/22/13 [redacted]w[redacted]d 09:35 / 00:18 7 lb 4.8 oz (3.311 kg) M Vag-Spont None  LIV      History reviewed. No pertinent past medical history.  History reviewed. No pertinent surgical history.   Current Outpatient Prescriptions:  .  levonorgestrel (MIRENA) 20 MCG/24HR [name], 1 each by Intrauterine route once., Disp: , Rfl:  Allergies  Allergen Reactions  . Shellfish Allergy Swelling    Throat     Social History  Substance Use Topics  . Smoking status: Never Smoker  . Smokeless  tobacco: Never Used  . Alcohol use No    Family History  Problem Relation Age of Onset  . Diabetes Father   . Stroke Father   . Heart disease Father   . Diabetes Maternal Grandfather   . Heart disease Maternal Grandfather   . Hypertension Maternal Grandfather   . Heart attack Paternal Grandmother       Review of Systems  Constitutional: negative for fatigue and weight loss Respiratory: negative for cough and wheezing Cardiovascular: negative for chest pain, fatigue and palpitations Gastrointestinal: negative for abdominal pain and change in bowel habits Musculoskeletal:negative for myalgias Neurological: negative for gait problems and tremors Behavioral/Psych: negative for abusive relationship, depression Endocrine: negative for temperature intolerance   Genitourinary:positive for abnormally long period this past month Integument/breast: negative for breast lump, breast tenderness, nipple discharge and skin lesion(s)    Objective:       BP 109/72   Pulse 80   Ht  (1.753 m)   Wt 247 lb 6.4 oz (112.2 kg)   LMP 11/04/2015 (Exact Date)   BMI 36.53 kg/m  General:   alert  Skin:   no rash or abnormalities  Lungs:   clear to auscultation bilaterally  Heart:   regular rate and rhythm, S1, S2 normal, no murmur, click, rub or gallop  Breasts:   normal without suspicious masses, skin or nipple changes or axillary nodes  Abdomen:  normal findings:  no organomegaly, soft, non-tender and no hernia  Pelvis:  External genitalia: normal general appearance Urinary system: urethral meatus normal and bladder without fullness, nontender Vaginal: normal without tenderness, induration or masses Cervix: normal appearance.  [name] string not visible Adnexa: normal bimanual exam Uterus: anteverted and non-tender, normal size   Lab Review Urine pregnancy test Labs reviewed yes Radiologic studies reviewed no    Assessment:    Healthy female exam.    AUB with [name]   Plan:     Ultrasound ordered for [name] Placement  Education reviewed: low fat, low cholesterol diet, safe sex/STD prevention, self breast exams and weight bearing exercise. Contraception: [name]. Follow up in: 2 weeks.   Meds ordered this encounter  Medications  . levonorgestrel (MIRENA) 20 MCG/24HR [name]    Sig: 1 each by Intrauterine route once.   Orders Placed This Encounter  Procedures  . Ambulatory referral to Urogynecology    Referral Priority:   Routine    Referral Type:   Consultation    Referral Reason:   Specialty Services Required    Requested Specialty:   Urology    Number of Visits Requested:   1

## 2015-12-15 LAB — PAP IG W/ RFLX HPV ASCU: PAP SMEAR COMMENT: 0

## 2015-12-17 LAB — NUSWAB VG+, CANDIDA 6SP
Atopobium vaginae: HIGH Score — AB
BVAB 2: HIGH {score} — AB
CANDIDA ALBICANS, NAA: NEGATIVE
CANDIDA GLABRATA, NAA: NEGATIVE
CANDIDA PARAPSILOSIS, NAA: NEGATIVE
Candida krusei, NAA: NEGATIVE
Candida lusitaniae, NAA: NEGATIVE
Candida tropicalis, NAA: NEGATIVE
Chlamydia trachomatis, NAA: NEGATIVE
MEGASPHAERA 1: HIGH {score} — AB
Neisseria gonorrhoeae, NAA: NEGATIVE
TRICH VAG BY NAA: NEGATIVE

## 2015-12-18 ENCOUNTER — Other Ambulatory Visit: Payer: Self-pay | Admitting: Obstetrics

## 2015-12-18 ENCOUNTER — Telehealth: Payer: Self-pay

## 2015-12-18 DIAGNOSIS — B9689 Other specified bacterial agents as the cause of diseases classified elsewhere: Secondary | ICD-10-CM

## 2015-12-18 DIAGNOSIS — N760 Acute vaginitis: Principal | ICD-10-CM

## 2015-12-18 MED ORDER — METRONIDAZOLE 500 MG PO TABS: 500.0000 mg | ORAL_TABLET | Freq: Two times a day (BID) | ORAL | 2 refills | Status: DC

## 2015-12-18 NOTE — Telephone Encounter (Signed)
Left message for patient with u/s date and time.

## 2015-12-19 ENCOUNTER — Telehealth: Payer: Self-pay | Admitting: *Deleted

## 2015-12-19 NOTE — Telephone Encounter (Signed)
Message left to call for lab results.

## 2015-12-21 ENCOUNTER — Encounter (INDEPENDENT_AMBULATORY_CARE_PROVIDER_SITE_OTHER): Payer: Self-pay

## 2015-12-21 ENCOUNTER — Ambulatory Visit (HOSPITAL_COMMUNITY)
Admission: RE | Admit: 2015-12-21 | Discharge: 2015-12-21 | Disposition: A | Discharge status: Home / Self Care | Payer: Commercial Managed Care - HMO | Source: Ambulatory Visit | Attending: Obstetrics | Admitting: Obstetrics

## 2015-12-21 DIAGNOSIS — Z975 Presence of (intrauterine) contraceptive device: Secondary | ICD-10-CM

## 2015-12-21 DIAGNOSIS — N921 Excessive and frequent menstruation with irregular cycle: Secondary | ICD-10-CM | POA: Insufficient documentation

## 2015-12-21 DIAGNOSIS — N83291 Other ovarian cyst, right side: Secondary | ICD-10-CM | POA: Diagnosis not present

## 2015-12-21 DIAGNOSIS — N939 Abnormal uterine and vaginal bleeding, unspecified: Secondary | ICD-10-CM

## 2015-12-21 DIAGNOSIS — T8389XA Other specified complication of genitourinary prosthetic devices, implants and grafts, initial encounter: Secondary | ICD-10-CM | POA: Diagnosis not present

## 2015-12-27 ENCOUNTER — Telehealth: Payer: Self-pay | Admitting: *Deleted

## 2015-12-27 NOTE — Telephone Encounter (Signed)
Patient made aware of Pelvic ultrasound result and call sent to front desk to schedule [name] removal with Dr Clearance CootsHarper.

## 2016-01-09 ENCOUNTER — Ambulatory Visit (INDEPENDENT_AMBULATORY_CARE_PROVIDER_SITE_OTHER): Payer: Commercial Managed Care - HMO | Admitting: Obstetrics

## 2016-01-09 VITALS — BP 113/64 | HR 83 | Wt 252.0 lb

## 2016-01-09 DIAGNOSIS — B9689 Other specified bacterial agents as the cause of diseases classified elsewhere: Secondary | ICD-10-CM

## 2016-01-09 DIAGNOSIS — Z30432 Encounter for removal of intrauterine contraceptive device: Secondary | ICD-10-CM | POA: Diagnosis not present

## 2016-01-09 DIAGNOSIS — Z3009 Encounter for other general counseling and advice on contraception: Secondary | ICD-10-CM

## 2016-01-09 DIAGNOSIS — A499 Bacterial infection, unspecified: Secondary | ICD-10-CM

## 2016-01-09 DIAGNOSIS — N760 Acute vaginitis: Secondary | ICD-10-CM

## 2016-01-09 DIAGNOSIS — Z30011 Encounter for initial prescription of contraceptive pills: Secondary | ICD-10-CM

## 2016-01-09 MED ORDER — METRONIDAZOLE 500 MG PO TABS: 500.0000 mg | ORAL_TABLET | Freq: Two times a day (BID) | ORAL | 2 refills | Status: DC

## 2016-01-09 MED ORDER — LO LOESTRIN FE 1 MG-10 MCG / 10 MCG PO TABS
1.0000 | ORAL_TABLET | Freq: Every day | ORAL | 4 refills | Status: DC
Start: 2016-01-09 — End: 2016-06-26

## 2016-01-09 NOTE — Progress Notes (Signed)
Patient ID: Jamie [name], female   DOB: November 12, 1990, 25 y.o.   MRN: [id number]  Chief Complaint  Patient presents with  . Contraception    iud removal    HPI Jamie [name] is a 25 y.o. female.  History of heavy and prolonged vaginal bleeding.  Ultrasound revealed malpositioned [name] that had dropped down into the cervical canal.  Patient presents for removal of Malpositioned [name].  She would like to start OCP's.  Also has history of BV, untreated. HPI  No past medical history on file.  No past surgical history on file.  Family History  Problem Relation Age of Onset  . Diabetes Father   . Stroke Father   . Heart disease Father   . Diabetes Maternal Grandfather   . Heart disease Maternal Grandfather   . Hypertension Maternal Grandfather   . Heart attack Paternal Grandmother     Social History Social History  Substance Use Topics  . Smoking status: Never Smoker  . Smokeless tobacco: Never Used  . Alcohol use No    Allergies  Allergen Reactions  . Shellfish Allergy Swelling    Throat     Current Outpatient Prescriptions  Medication Sig Dispense Refill  . levonorgestrel (MIRENA) 20 MCG/24HR [name] 1 each by Intrauterine route once.    . LO LOESTRIN FE 1 MG-10 MCG / 10 MCG tablet Take 1 tablet by mouth daily. 3 Package 4  . metroNIDAZOLE (FLAGYL) 500 MG tablet Take 1 tablet (500 mg total) by mouth 2 (two) times daily. 14 tablet 2   No current facility-administered medications for this visit.     Review of Systems Review of Systems Constitutional: negative for fatigue and weight loss Respiratory: negative for cough and wheezing Cardiovascular: negative for chest pain, fatigue and palpitations Gastrointestinal: negative for abdominal pain and change in bowel habits Genitourinary:positive for malodorous vaginal discharge Integument/breast: negative for nipple discharge Musculoskeletal:negative for myalgias Neurological: negative for gait problems and  tremors Behavioral/Psych: negative for abusive relationship, depression Endocrine: negative for temperature intolerance     Blood pressure 113/64, pulse 83, weight 252 lb (114.3 kg), last menstrual period 11/04/2015.  Physical Exam Physical Exam General:   alert  Skin:   no rash or abnormalities  Lungs:   clear to auscultation bilaterally  Heart:   regular rate and rhythm, S1, S2 normal, no murmur, click, rub or gallop  Breasts:   normal without suspicious masses, skin or nipple changes or axillary nodes  Abdomen:  normal findings: no organomegaly, soft, non-tender and no hernia  Pelvis:  External genitalia: normal general appearance Urinary system: urethral meatus normal and bladder without fullness, nontender Vaginal: normal without tenderness, induration or masses Cervix: normal appearance Adnexa: normal bimanual exam Uterus: anteverted and non-tender, normal size    50% of 20 min visit spent on counseling and coordination of care.   Data Reviewed Ultrasound   Assessment     Malpositioned [name].  AUB.  Desires removal of [name]. Contraceptive counseling and advice.  Wants to start OCP's.    Plan     [name] Removed, intact  Lo Loestrin 24 Fe Rx  F/U 1 year  No orders of the defined types were placed in this encounter.  Meds ordered this encounter  Medications  . LO LOESTRIN FE 1 MG-10 MCG / 10 MCG tablet    Sig: Take 1 tablet by mouth daily.    Dispense:  3 Package    Refill:  4    Submit other coverage code 3  BIN:  [id number]  PCN:  [id number]   GRP:  [id number]:  [id number]   :  [id number]:  [id number]  . metroNIDAZOLE (FLAGYL) 500 MG tablet    Sig: Take 1 tablet (500 mg total) by mouth 2 (two) times daily.    Dispense:  14 tablet    Refill:  2

## 2016-05-05 NOTE — L&D Delivery Note (Signed)
Patient is 26 y.o. G2P1001 3933w5d admitted in [facility].  AROM at 0910.  Prenatal course uncomplicated.  Delivery Note At 12:42 PM a viable female was delivered via Vaginal, Spontaneous Delivery (Presentation: cephalic; ROA).  APGAR: 8, 9; weight pending.   Placenta status: grossly normal, intact.  Cord: 3 vessels with the following complications: hand presentation.  Cord pH: not drawn  Anesthesia:  None Episiotomy: None Lacerations: 1st degree Suture Repair: None needed Est. Blood Loss (mL):  200  Mom to postpartum.  Baby to Couplet care / Skin to Skin.  Upon arrival patient was complete and pushing. She pushed with good maternal effort to deliver a viable female infant in cephalic, ROA position. No nuchal cord present. Increased traction was applied due to hand presentation as baby's head emerged. Baby was noted to have good tone and placed on maternal abdomen for oral suctioning, drying and stimulation. Delayed cord clamping performed. Placenta delivered spontaneously with gentle cord traction. Fundus firm with massage and Pitocin. Perineum inspected and found to have 1st degree perineal laceration, which was found to be hemostatic.  Counts of sharps, instruments, and lap pads were all correct.   Idonna Heeren C. Frances FurbishWinfrey, MD PGY-1, [facility] 01/05/2017 1:09 PM

## 2016-05-06 ENCOUNTER — Ambulatory Visit (INDEPENDENT_AMBULATORY_CARE_PROVIDER_SITE_OTHER): Payer: Commercial Managed Care - HMO | Admitting: *Deleted

## 2016-05-06 DIAGNOSIS — Z3201 Encounter for pregnancy test, result positive: Secondary | ICD-10-CM | POA: Diagnosis not present

## 2016-05-06 DIAGNOSIS — N912 Amenorrhea, unspecified: Secondary | ICD-10-CM | POA: Diagnosis not present

## 2016-05-06 LAB — POCT URINE PREGNANCY: Preg Test, Ur: POSITIVE — AB

## 2016-05-06 NOTE — Progress Notes (Signed)
Patient LMP 03/26/2017 [id number] with EDD 12/31/2016 Patient is going on cruise 05/12/2016-05/17/2016 She is taking OTC prenatal which she is happy with. Patient to schedule her prenatal appointment when she returns.

## 2016-05-29 ENCOUNTER — Ambulatory Visit (INDEPENDENT_AMBULATORY_CARE_PROVIDER_SITE_OTHER): Payer: Commercial Managed Care - HMO | Admitting: Obstetrics and Gynecology

## 2016-05-29 ENCOUNTER — Encounter: Payer: Self-pay | Admitting: Obstetrics and Gynecology

## 2016-05-29 DIAGNOSIS — Z3480 Encounter for supervision of other normal pregnancy, unspecified trimester: Secondary | ICD-10-CM | POA: Insufficient documentation

## 2016-05-29 DIAGNOSIS — Z113 Encounter for screening for infections with a predominantly sexual mode of transmission: Secondary | ICD-10-CM | POA: Diagnosis not present

## 2016-05-29 DIAGNOSIS — Z3481 Encounter for supervision of other normal pregnancy, first trimester: Secondary | ICD-10-CM

## 2016-05-29 NOTE — Progress Notes (Signed)
Patient states that she feels good today. 

## 2016-05-29 NOTE — Patient Instructions (Addendum)
 First Trimester of Pregnancy The first trimester of pregnancy is from week 1 until the end of week 12 (months 1 through 3). A week after a sperm fertilizes an egg, the egg will implant on the wall of the uterus. This embryo will begin to develop into a baby. Genes from you and your partner are forming the baby. The female genes determine whether the baby is a boy or a girl. At 6-8 weeks, the eyes and face are formed, and the heartbeat can be seen on ultrasound. At the end of 12 weeks, all the baby's organs are formed.  Now that you are pregnant, you will want to do everything you can to have a healthy baby. Two of the most important things are to get good prenatal care and to follow your health care provider's instructions. Prenatal care is all the medical care you receive before the baby's birth. This care will help prevent, find, and treat any problems during the pregnancy and childbirth. BODY CHANGES Your body goes through many changes during pregnancy. The changes vary from woman to woman.   You may gain or lose a couple of pounds at first.  You may feel sick to your stomach (nauseous) and throw up (vomit). If the vomiting is uncontrollable, call your health care provider.  You may tire easily.  You may develop headaches that can be relieved by medicines approved by your health care provider.  You may urinate more often. Painful urination may mean you have a bladder infection.  You may develop heartburn as a result of your pregnancy.  You may develop constipation because certain hormones are causing the muscles that push waste through your intestines to slow down.  You may develop hemorrhoids or swollen, bulging veins (varicose veins).  Your breasts may begin to grow larger and become tender. Your nipples may stick out more, and the tissue that surrounds them (areola) may become darker.  Your gums may bleed and may be sensitive to brushing and flossing.  Dark spots or blotches  (chloasma, mask of pregnancy) may develop on your face. This will likely fade after the baby is born.  Your menstrual periods will stop.  You may have a loss of appetite.  You may develop cravings for certain kinds of food.  You may have changes in your emotions from day to day, such as being excited to be pregnant or being concerned that something may go wrong with the pregnancy and baby.  You may have more vivid and strange dreams.  You may have changes in your hair. These can include thickening of your hair, rapid growth, and changes in texture. Some women also have hair loss during or after pregnancy, or hair that feels dry or thin. Your hair will most likely return to normal after your baby is born. WHAT TO EXPECT AT YOUR PRENATAL VISITS During a routine prenatal visit:  You will be weighed to make sure you and the baby are growing normally.  Your blood pressure will be taken.  Your abdomen will be measured to track your baby's growth.  The fetal heartbeat will be listened to starting around week 10 or 12 of your pregnancy.  Test results from any previous visits will be discussed. Your health care provider may ask you:  How you are feeling.  If you are feeling the baby move.  If you have had any abnormal symptoms, such as leaking fluid, bleeding, severe headaches, or abdominal cramping.  If you are using any tobacco   products, including cigarettes, chewing tobacco, and electronic cigarettes.  If you have any questions. Other tests that may be performed during your first trimester include:  Blood tests to find your blood type and to check for the presence of any previous infections. They will also be used to check for low iron levels (anemia) and Rh antibodies. Later in the pregnancy, blood tests for diabetes will be done along with other tests if problems develop.  Urine tests to check for infections, diabetes, or protein in the urine.  An ultrasound to confirm the  proper growth and development of the baby.  An amniocentesis to check for possible genetic problems.  Fetal screens for spina bifida and Down syndrome.  You may need other tests to make sure you and the baby are doing well.  HIV (human immunodeficiency virus) testing. Routine prenatal testing includes screening for HIV, unless you choose not to have this test. HOME CARE INSTRUCTIONS  Medicines   Follow your health care provider's instructions regarding medicine use. Specific medicines may be either safe or unsafe to take during pregnancy.  Take your prenatal vitamins as directed.  If you develop constipation, try taking a stool softener if your health care provider approves. Diet   Eat regular, well-balanced meals. Choose a variety of foods, such as meat or vegetable-based protein, fish, milk and low-fat dairy products, vegetables, fruits, and whole grain breads and cereals. Your health care provider will help you determine the amount of weight gain that is right for you.  Avoid raw meat and uncooked cheese. These carry germs that can cause birth defects in the baby.  Eating four or five small meals rather than three large meals a day may help relieve nausea and vomiting. If you start to feel nauseous, eating a few soda crackers can be helpful. Drinking liquids between meals instead of during meals also seems to help nausea and vomiting.  If you develop constipation, eat more high-fiber foods, such as fresh vegetables or fruit and whole grains. Drink enough fluids to keep your urine clear or pale yellow. Activity and Exercise   Exercise only as directed by your health care provider. Exercising will help you:  Control your weight.  Stay in shape.  Be prepared for labor and delivery.  Experiencing pain or cramping in the lower abdomen or low back is a good sign that you should stop exercising. Check with your health care provider before continuing normal exercises.  Try to avoid  standing for long periods of time. Move your legs often if you must stand in one place for a long time.  Avoid heavy lifting.  Wear low-heeled shoes, and practice good posture.  You may continue to have sex unless your health care provider directs you otherwise. Relief of Pain or Discomfort   Wear a good support bra for breast tenderness.   Take warm sitz baths to soothe any pain or discomfort caused by hemorrhoids. Use hemorrhoid cream if your health care provider approves.   Rest with your legs elevated if you have leg cramps or low back pain.  If you develop varicose veins in your legs, wear support hose. Elevate your feet for 15 minutes, 3-4 times a day. Limit salt in your diet. Prenatal Care   Schedule your prenatal visits by the twelfth week of pregnancy. They are usually scheduled monthly at first, then more often in the last 2 months before delivery.  Write down your questions. Take them to your prenatal visits.  Keep all your   prenatal visits as directed by your health care provider. Safety   Wear your seat belt at all times when driving.  Make a list of emergency phone numbers, including numbers for family, friends, the hospital, and police and fire departments. General Tips   Ask your health care provider for a referral to a local prenatal education class. Begin classes no later than at the beginning of month 6 of your pregnancy.  Ask for help if you have counseling or nutritional needs during pregnancy. Your health care provider can offer advice or refer you to specialists for help with various needs.  Do not use hot tubs, steam rooms, or saunas.  Do not douche or use tampons or scented sanitary pads.  Do not cross your legs for long periods of time.  Avoid cat litter boxes and soil used by cats. These carry germs that can cause birth defects in the baby and possibly loss of the fetus by miscarriage or stillbirth.  Avoid all smoking, herbs, alcohol, and  medicines not prescribed by your health care provider. Chemicals in these affect the formation and growth of the baby.  Do not use any tobacco products, including cigarettes, chewing tobacco, and electronic cigarettes. If you need help quitting, ask your health care provider. You may receive counseling support and other resources to help you quit.  Schedule a dentist appointment. At home, brush your teeth with a soft toothbrush and be gentle when you floss. SEEK MEDICAL CARE IF:   You have dizziness.  You have mild pelvic cramps, pelvic pressure, or nagging pain in the abdominal area.  You have persistent nausea, vomiting, or diarrhea.  You have a bad smelling vaginal discharge.  You have pain with urination.  You notice increased swelling in your face, hands, legs, or ankles. SEEK IMMEDIATE MEDICAL CARE IF:   You have a fever.  You are leaking fluid from your vagina.  You have spotting or bleeding from your vagina.  You have severe abdominal cramping or pain.  You have rapid weight gain or loss.  You vomit blood or material that looks like coffee grounds.  You are exposed to German measles and have never had them.  You are exposed to fifth disease or chickenpox.  You develop a severe headache.  You have shortness of breath.  You have any kind of trauma, such as from a fall or a car accident. This information is not intended to replace advice given to you by your health care provider. Make sure you discuss any questions you have with your health care provider. Document Released: 04/15/2001 Document Revised: 05/12/2014 Document Reviewed: 03/01/2013 Else[employer] Interactive Patient Education  2017 [employer]  Contraception Choices Contraception (birth control) is the use of any methods or devices to prevent pregnancy. Below are some methods to help avoid pregnancy. Hormonal methods  Contraceptive implant. This is a thin, plastic tube containing progesterone hormone. It  does not contain estrogen hormone. Your health care provider inserts the tube in the inner part of the upper arm. The tube can remain in place for up to 3 years. After 3 years, the implant must be removed. The implant prevents the ovaries from releasing an egg (ovulation), thickens the cervical mucus to prevent sperm from entering the uterus, and thins the lining of the inside of the uterus.  Progesterone-only injections. These injections are given every 3 months by your health care provider to prevent pregnancy. This synthetic progesterone hormone stops the ovaries from releasing eggs. It also thickens cervical   mucus and changes the uterine lining. This makes it harder for sperm to survive in the uterus.  Birth control pills. These pills contain estrogen and progesterone hormone. They work by preventing the ovaries from releasing eggs (ovulation). They also cause the cervical mucus to thicken, preventing the sperm from entering the uterus. Birth control pills are prescribed by a health care provider.Birth control pills can also be used to treat heavy periods.  Minipill. This type of birth control pill contains only the progesterone hormone. They are taken every day of each month and must be prescribed by your health care provider.  Birth control patch. The patch contains hormones similar to those in birth control pills. It must be changed once a week and is prescribed by a health care provider.  Vaginal ring. The ring contains hormones similar to those in birth control pills. It is left in the vagina for 3 weeks, removed for 1 week, and then a new one is put back in place. The patient must be comfortable inserting and removing the ring from the vagina.A health care provider's prescription is necessary.  Emergency contraception. Emergency contraceptives prevent pregnancy after unprotected sexual intercourse. This pill can be taken right after sex or up to 5 days after unprotected sex. It is most  effective the sooner you take the pills after having sexual intercourse. Most emergency contraceptive pills are available without a prescription. Check with your pharmacist. Do not use emergency contraception as your only form of birth control. Barrier methods  Female condom. This is a thin sheath (latex or rubber) that is worn over the penis during sexual intercourse. It can be used with spermicide to increase effectiveness.  Female condom. This is a soft, loose-fitting sheath that is put into the vagina before sexual intercourse.  Diaphragm. This is a soft, latex, dome-shaped barrier that must be fitted by a health care provider. It is inserted into the vagina, along with a spermicidal jelly. It is inserted before intercourse. The diaphragm should be left in the vagina for 6 to 8 hours after intercourse.  Cervical cap. This is a round, soft, latex or plastic cup that fits over the cervix and must be fitted by a health care provider. The cap can be left in place for up to 48 hours after intercourse.  Sponge. This is a soft, circular piece of polyurethane foam. The sponge has spermicide in it. It is inserted into the vagina after wetting it and before sexual intercourse.  Spermicides. These are chemicals that kill or block sperm from entering the cervix and uterus. They come in the form of creams, jellies, suppositories, foam, or tablets. They do not require a prescription. They are inserted into the vagina with an applicator before having sexual intercourse. The process must be repeated every time you have sexual intercourse. Intrauterine contraception  Intrauterine device ([name]). This is a T-shaped device that is put in a woman's uterus during a menstrual period to prevent pregnancy. There are 2 types:  Copper [name]. This type of [name] is wrapped in copper wire and is placed inside the uterus. Copper makes the uterus and fallopian tubes produce a fluid that kills sperm. It can stay in place for 10  years.  Hormone [name]. This type of [name] contains the hormone progestin (synthetic progesterone). The hormone thickens the cervical mucus and prevents sperm from entering the uterus, and it also thins the uterine lining to prevent implantation of a fertilized egg. The hormone can weaken or kill the sperm that   get into the uterus. It can stay in place for 3-5 years, depending on which type of [name] is used. Permanent methods of contraception  Female tubal ligation. This is when the woman's fallopian tubes are surgically sealed, tied, or blocked to prevent the egg from traveling to the uterus.  Hysteroscopic sterilization. This involves placing a small coil or insert into each fallopian tube. Your doctor uses a technique called hysteroscopy to do the procedure. The device causes scar tissue to form. This results in permanent blockage of the fallopian tubes, so the sperm cannot fertilize the egg. It takes about 3 months after the procedure for the tubes to become blocked. You must use another form of birth control for these 3 months.  Female sterilization. This is when the female has the tubes that carry sperm tied off (vasectomy).This blocks sperm from entering the vagina during sexual intercourse. After the procedure, the man can still ejaculate fluid (semen). Natural planning methods  Natural family planning. This is not having sexual intercourse or using a barrier method (condom, diaphragm, cervical cap) on days the woman could become pregnant.  Calendar method. This is keeping track of the length of each menstrual cycle and identifying when you are fertile.  Ovulation method. This is avoiding sexual intercourse during ovulation.  Symptothermal method. This is avoiding sexual intercourse during ovulation, using a thermometer and ovulation symptoms.  Post-ovulation method. This is timing sexual intercourse after you have ovulated. Regardless of which type or method of contraception you choose, it is  important that you use condoms to protect against the transmission of sexually transmitted infections (STIs). Talk with your health care provider about which form of contraception is most appropriate for you. This information is not intended to replace advice given to you by your health care provider. Make sure you discuss any questions you have with your health care provider. Document Released: 04/21/2005 Document Revised: 09/27/2015 Document Reviewed: 10/14/2012 Elsevier Interactive Patient Education  2017 [employer]   Breastfeeding Deciding to breastfeed is one of the best choices you can make for you and your baby. A change in hormones during pregnancy causes your breast tissue to grow and increases the number and size of your milk ducts. These hormones also allow proteins, sugars, and fats from your blood supply to make breast milk in your milk-producing glands. Hormones prevent breast milk from being released before your baby is born as well as prompt milk flow after birth. Once breastfeeding has begun, thoughts of your baby, as well as his or her sucking or crying, can stimulate the release of milk from your milk-producing glands. Benefits of breastfeeding For Your Baby  Your first milk (colostrum) helps your baby's digestive system function better.  There are antibodies in your milk that help your baby fight off infections.  Your baby has a lower incidence of asthma, allergies, and sudden infant death syndrome.  The nutrients in breast milk are better for your baby than infant formulas and are designed uniquely for your baby's needs.  Breast milk improves your baby's brain development.  Your baby is less likely to develop other conditions, such as childhood obesity, asthma, or type 2 diabetes mellitus. For You  Breastfeeding helps to create a very special bond between you and your baby.  Breastfeeding is convenient. Breast milk is always available at the correct temperature and  costs nothing.  Breastfeeding helps to burn calories and helps you lose the weight gained during pregnancy.  Breastfeeding makes your uterus contract to its   prepregnancy size faster and slows bleeding (lochia) after you give birth.  Breastfeeding helps to lower your risk of developing type 2 diabetes mellitus, osteoporosis, and breast or ovarian cancer later in life. Signs that your baby is hungry Early Signs of Hunger  Increased alertness or activity.  Stretching.  Movement of the head from side to side.  Movement of the head and opening of the mouth when the corner of the mouth or cheek is stroked (rooting).  Increased sucking sounds, smacking lips, cooing, sighing, or squeaking.  Hand-to-mouth movements.  Increased sucking of fingers or hands. Late Signs of Hunger  Fussing.  Intermittent crying. Extreme Signs of Hunger  Signs of extreme hunger will require calming and consoling before your baby will be able to breastfeed successfully. Do not wait for the following signs of extreme hunger to occur before you initiate breastfeeding:  Restlessness.  A loud, strong cry.  Screaming. Breastfeeding basics  Breastfeeding Initiation  Find a comfortable place to sit or lie down, with your neck and back well supported.  Place a pillow or rolled up blanket under your baby to bring him or her to the level of your breast (if you are seated). Nursing pillows are specially designed to help support your arms and your baby while you breastfeed.  Make sure that your baby's abdomen is facing your abdomen.  Gently massage your breast. With your fingertips, massage from your chest wall toward your nipple in a circular motion. This encourages milk flow. You may need to continue this action during the feeding if your milk flows slowly.  Support your breast with 4 fingers underneath and your thumb above your nipple. Make sure your fingers are well away from your nipple and your baby's  mouth.  Stroke your baby's lips gently with your finger or nipple.  When your baby's mouth is open wide enough, quickly bring your baby to your breast, placing your entire nipple and as much of the colored area around your nipple (areola) as possible into your baby's mouth.  More areola should be visible above your baby's upper lip than below the lower lip.  Your baby's tongue should be between his or her lower gum and your breast.  Ensure that your baby's mouth is correctly positioned around your nipple (latched). Your baby's lips should create a seal on your breast and be turned out (everted).  It is common for your baby to suck about 2-3 minutes in order to start the flow of breast milk. Latching  Teaching your baby how to latch on to your breast properly is very important. An improper latch can cause nipple pain and decreased milk supply for you and poor weight gain in your baby. Also, if your baby is not latched onto your nipple properly, he or she may swallow some air during feeding. This can make your baby fussy. Burping your baby when you switch breasts during the feeding can help to get rid of the air. However, teaching your baby to latch on properly is still the best way to prevent fussiness from swallowing air while breastfeeding. Signs that your baby has successfully latched on to your nipple:  Silent tugging or silent sucking, without causing you pain.  Swallowing heard between every 3-4 sucks.  Muscle movement above and in front of his or her ears while sucking. Signs that your baby has not successfully latched on to nipple:  Sucking sounds or smacking sounds from your baby while breastfeeding.  Nipple pain. If you think your   baby has not latched on correctly, slip your finger into the corner of your baby's mouth to break the suction and place it between your baby's gums. Attempt breastfeeding initiation again. Signs of Successful Breastfeeding  Signs from your baby:  A  gradual decrease in the number of sucks or complete cessation of sucking.  Falling asleep.  Relaxation of his or her body.  Retention of a small amount of milk in his or her mouth.  Letting go of your breast by himself or herself. Signs from you:  Breasts that have increased in firmness, weight, and size 1-3 hours after feeding.  Breasts that are softer immediately after breastfeeding.  Increased milk volume, as well as a change in milk consistency and color by the fifth day of breastfeeding.  Nipples that are not sore, cracked, or bleeding. Signs That Your Baby is Getting Enough Milk  Wetting at least 1-2 diapers during the first 24 hours after birth.  Wetting at least 5-6 diapers every 24 hours for the first week after birth. The urine should be clear or pale yellow by 5 days after birth.  Wetting 6-8 diapers every 24 hours as your baby continues to grow and develop.  At least 3 stools in a 24-hour period by age 5 days. The stool should be soft and yellow.  At least 3 stools in a 24-hour period by age 7 days. The stool should be seedy and yellow.  No loss of weight greater than 10% of birth weight during the first 3 days of age.  Average weight gain of 4-7 ounces (113-198 g) per week after age 4 days.  Consistent daily weight gain by age 5 days, without weight loss after the age of 2 weeks. After a feeding, your baby may spit up a small amount. This is common. Breastfeeding frequency and duration Frequent feeding will help you make more milk and can prevent sore nipples and breast engorgement. Breastfeed when you feel the need to reduce the fullness of your breasts or when your baby shows signs of hunger. This is called "breastfeeding on demand." Avoid introducing a pacifier to your baby while you are working to establish breastfeeding (the first 4-6 weeks after your baby is born). After this time you may choose to use a pacifier. Research has shown that pacifier use during the  first year of a baby's life decreases the risk of sudden infant death syndrome (SIDS). Allow your baby to feed on each breast as long as he or she wants. Breastfeed until your baby is finished feeding. When your baby unlatches or falls asleep while feeding from the first breast, offer the second breast. Because newborns are often sleepy in the first few weeks of life, you may need to awaken your baby to get him or her to feed. Breastfeeding times will vary from baby to baby. However, the following rules can serve as a guide to help you ensure that your baby is properly fed:  Newborns (babies 4 weeks of age or younger) may breastfeed every 1-3 hours.  Newborns should not go longer than 3 hours during the day or 5 hours during the night without breastfeeding.  You should breastfeed your baby a minimum of 8 times in a 24-hour period until you begin to introduce solid foods to your baby at around 6 months of age. Breast milk pumping Pumping and storing breast milk allows you to ensure that your baby is exclusively fed your breast milk, even at times when you are unable   to breastfeed. This is especially important if you are going back to work while you are still breastfeeding or when you are not able to be present during feedings. Your lactation consultant can give you guidelines on how long it is safe to store breast milk. A breast pump is a machine that allows you to pump milk from your breast into a sterile bottle. The pumped breast milk can then be stored in a refrigerator or freezer. Some breast pumps are operated by hand, while others use electricity. Ask your lactation consultant which type will work best for you. Breast pumps can be purchased, but some hospitals and breastfeeding support groups lease breast pumps on a monthly basis. A lactation consultant can teach you how to hand express breast milk, if you prefer not to use a pump. Caring for your breasts while you breastfeed Nipples can become  dry, cracked, and sore while breastfeeding. The following recommendations can help keep your breasts moisturized and healthy:  Avoid using soap on your nipples.  Wear a supportive bra. Although not required, special nursing bras and tank tops are designed to allow access to your breasts for breastfeeding without taking off your entire bra or top. Avoid wearing underwire-style bras or extremely tight bras.  Air dry your nipples for 3-4minutes after each feeding.  Use only cotton bra pads to absorb leaked breast milk. Leaking of breast milk between feedings is normal.  Use lanolin on your nipples after breastfeeding. Lanolin helps to maintain your skin's normal moisture barrier. If you use pure lanolin, you do not need to wash it off before feeding your baby again. Pure lanolin is not toxic to your baby. You may also hand express a few drops of breast milk and gently massage that milk into your nipples and allow the milk to air dry. In the first few weeks after giving birth, some women experience extremely full breasts (engorgement). Engorgement can make your breasts feel heavy, warm, and tender to the touch. Engorgement peaks within 3-5 days after you give birth. The following recommendations can help ease engorgement:  Completely empty your breasts while breastfeeding or pumping. You may want to start by applying warm, moist heat (in the shower or with warm water-soaked hand towels) just before feeding or pumping. This increases circulation and helps the milk flow. If your baby does not completely empty your breasts while breastfeeding, pump any extra milk after he or she is finished.  Wear a snug bra (nursing or regular) or tank top for 1-2 days to signal your body to slightly decrease milk production.  Apply ice packs to your breasts, unless this is too uncomfortable for you.  Make sure that your baby is latched on and positioned properly while breastfeeding. If engorgement persists after 48  hours of following these recommendations, contact your health care provider or a lactation consultant. Overall health care recommendations while breastfeeding  Eat healthy foods. Alternate between meals and snacks, eating 3 of each per day. Because what you eat affects your breast milk, some of the foods may make your baby more irritable than usual. Avoid eating these foods if you are sure that they are negatively affecting your baby.  Drink milk, fruit juice, and water to satisfy your thirst (about 10 glasses a day).  Rest often, relax, and continue to take your prenatal vitamins to prevent fatigue, stress, and anemia.  Continue breast self-awareness checks.  Avoid chewing and smoking tobacco. Chemicals from cigarettes that pass into breast milk and exposure to   secondhand smoke may harm your baby.  Avoid alcohol and drug use, including marijuana. Some medicines that may be harmful to your baby can pass through breast milk. It is important to ask your health care provider before taking any medicine, including all over-the-counter and prescription medicine as well as vitamin and herbal supplements. It is possible to become pregnant while breastfeeding. If birth control is desired, ask your health care provider about options that will be safe for your baby. Contact a health care provider if:  You feel like you want to stop breastfeeding or have become frustrated with breastfeeding.  You have painful breasts or nipples.  Your nipples are cracked or bleeding.  Your breasts are red, tender, or warm.  You have a swollen area on either breast.  You have a fever or chills.  You have nausea or vomiting.  You have drainage other than breast milk from your nipples.  Your breasts do not become full before feedings by the fifth day after you give birth.  You feel sad and depressed.  Your baby is too sleepy to eat well.  Your baby is having trouble sleeping.  Your baby is wetting less  than 3 diapers in a 24-hour period.  Your baby has less than 3 stools in a 24-hour period.  Your baby's skin or the white part of his or her eyes becomes yellow.  Your baby is not gaining weight by 5 days of age. Get help right away if:  Your baby is overly tired (lethargic) and does not want to wake up and feed.  Your baby develops an unexplained fever. This information is not intended to replace advice given to you by your health care provider. Make sure you discuss any questions you have with your health care provider. Document Released: 04/21/2005 Document Revised: 10/03/2015 Document Reviewed: 10/13/2012 Elsevier Interactive Patient Education  2017 [employer]  

## 2016-05-29 NOTE — Progress Notes (Signed)
  Subjective:    Jamie [name] is a G2P1001 3359w1d being seen today for her first obstetrical visit.  Her obstetrical history is significant for previous normal pregnancy. Patient does intend to breast feed. Pregnancy history fully reviewed.  Patient reports nausea.  Vitals:   05/29/16 1401  BP: 108/72  Pulse: 92  Temp: 98.7 F (37.1 C)  Weight: 252 lb (114.3 kg)    HISTORY: OB History  Gravida Para Term Preterm AB Living  2 1 1     1   SAB TAB Ectopic Multiple Live Births          1    # Outcome Date GA Lbr Len/2nd Weight Sex Delivery Anes PTL Lv  2 Current           1 Term 05/22/13 5084w3d 09:35 / 00:18 7 lb 4.8 oz (3.311 kg) M Vag-Spont None  LIV     History reviewed. No pertinent past medical history. History reviewed. No pertinent surgical history. Family History  Problem Relation Age of Onset  . Diabetes Father   . Stroke Father   . Heart disease Father   . Diabetes Maternal Grandfather   . Heart disease Maternal Grandfather   . Hypertension Maternal Grandfather   . Heart attack Paternal Grandmother      Exam    Uterus:   10-week size  Pelvic Exam:    Perineum: No Hemorrhoids, Normal Perineum   Vulva: normal   Vagina:  normal mucosa, normal discharge   pH:    Cervix: multiparous appearance and cervix is closed and long   Adnexa: normal adnexa and no mass, fullness, tenderness   Bony Pelvis: gynecoid  System: Breast:  normal appearance, no masses or tenderness   Skin: normal coloration and turgor, no rashes    Neurologic: oriented, no focal deficits   Extremities: normal strength, tone, and muscle mass   HEENT extra ocular movement intact   Mouth/Teeth mucous membranes moist, pharynx normal without lesions and dental hygiene good   Neck supple and no masses   Cardiovascular: regular rate and rhythm   Respiratory:  chest clear, no wheezing, crepitations, rhonchi, normal symmetric air entry   Abdomen: soft, non-tender; bowel sounds normal; no masses,  no  organomegaly   Urinary:       Assessment:    Pregnancy: G2P1001 Patient Active Problem List   Diagnosis Date Noted  . Supervision of other normal pregnancy, antepartum 05/29/2016        Plan:     Initial labs drawn. Prenatal vitamins. Problem list reviewed and updated. Genetic Screening discussed First Screen: ordered.  Ultrasound discussed; fetal survey: requested. Patient declined flu vaccine  Follow up in 4 weeks. 50% of 30 min visit spent on counseling and coordination of care.     Rebbeca Sheperd 05/29/2016

## 2016-05-30 LAB — GC/CHLAMYDIA PROBE AMP (~~LOC~~) NOT AT ARMC
Chlamydia: NEGATIVE
Neisseria Gonorrhea: NEGATIVE

## 2016-05-31 LAB — URINE CULTURE, OB REFLEX

## 2016-05-31 LAB — CULTURE, OB URINE

## 2016-06-03 LAB — TOXASSURE SELECT 13 (MW), URINE

## 2016-06-05 LAB — VARICELLA ZOSTER ANTIBODY, IGG: Varicella zoster IgG: 737 index (ref >165)

## 2016-06-05 LAB — HEMOGLOBINOPATHY EVALUATION
HGB C: 0 %
HGB S: 0 %
HGB VARIANT: 0 %
Hemoglobin A2 Quantitation: 2.3 % (ref 1.8–3.2)
Hemoglobin F Quantitation: 0 % (ref 0.0–2.0)
Hgb A: 97.7 % (ref 96.4–98.8)

## 2016-06-05 LAB — OBSTETRIC PANEL, INCLUDING HIV
ANTIBODY SCREEN: NEGATIVE
BASOS ABS: 0 10*3/uL (ref 0.0–0.2)
BASOS: 0 %
EOS (ABSOLUTE): 0.1 10*3/uL (ref 0.0–0.4)
Eos: 1 %
HEMATOCRIT: 34.8 % (ref 34.0–46.6)
HEP B S AG: NEGATIVE
HIV SCREEN 4TH GENERATION: NONREACTIVE
Hemoglobin: 11.7 g/dL (ref 11.1–15.9)
Immature Grans (Abs): 0 10*3/uL (ref 0.0–0.1)
Immature Granulocytes: 0 %
LYMPHS ABS: 2.5 10*3/uL (ref 0.7–3.1)
Lymphs: 37 %
MCH: 30.2 pg (ref 26.6–33.0)
MCHC: 33.6 g/dL (ref 31.5–35.7)
MCV: 90 fL (ref 79–97)
Monocytes Absolute: 0.5 10*3/uL (ref 0.1–0.9)
Monocytes: 8 %
NEUTROS ABS: 3.6 10*3/uL (ref 1.4–7.0)
Neutrophils: 54 %
PLATELETS: 316 10*3/uL (ref 150–379)
RBC: 3.87 x10E6/uL (ref 3.77–5.28)
RDW: 13.6 % (ref 12.3–15.4)
RPR: NONREACTIVE
Rh Factor: POSITIVE
Rubella Antibodies, IGG: 1.41 index (ref >0.99)
WBC: 6.7 10*3/uL (ref 3.4–10.8)

## 2016-06-05 LAB — HEMOGLOBIN A1C
Est. average glucose Bld gHb Est-mCnc: 103 mg/dL
Hgb A1c MFr Bld: 5.2 % (ref 4.8–5.6)

## 2016-06-05 LAB — CYSTIC FIBROSIS MUTATION 97: Interpretation: NOT DETECTED

## 2016-06-10 ENCOUNTER — Telehealth: Payer: Self-pay

## 2016-06-10 NOTE — Telephone Encounter (Signed)
Spoke with pt concerns of hypoglycemia. She used Dad's meter CBG was in 40's 2 hrs after eating. Recent HA1C normal. Informed pt after speaking with Dr. Clearance CootsHarper, pt needs well balanced meals including complex carbs and eat every 2 hrs. We will do CBG NV

## 2016-06-20 ENCOUNTER — Encounter (HOSPITAL_COMMUNITY): Payer: Self-pay | Admitting: Obstetrics and Gynecology

## 2016-06-25 ENCOUNTER — Encounter (HOSPITAL_COMMUNITY): Payer: Self-pay

## 2016-06-25 ENCOUNTER — Other Ambulatory Visit: Payer: Self-pay

## 2016-06-25 ENCOUNTER — Ambulatory Visit (HOSPITAL_COMMUNITY)
Admission: RE | Admit: 2016-06-25 | Discharge: 2016-06-25 | Disposition: A | Discharge status: Home / Self Care | Payer: Commercial Managed Care - HMO | Source: Ambulatory Visit | Attending: Obstetrics and Gynecology | Admitting: Obstetrics and Gynecology

## 2016-06-25 DIAGNOSIS — Z3682 Encounter for antenatal screening for nuchal translucency: Secondary | ICD-10-CM | POA: Diagnosis not present

## 2016-06-25 DIAGNOSIS — Z3A13 13 weeks gestation of pregnancy: Secondary | ICD-10-CM | POA: Diagnosis not present

## 2016-06-25 DIAGNOSIS — O99211 Obesity complicating pregnancy, first trimester: Secondary | ICD-10-CM | POA: Diagnosis not present

## 2016-06-25 DIAGNOSIS — Z3480 Encounter for supervision of other normal pregnancy, unspecified trimester: Secondary | ICD-10-CM

## 2016-06-26 ENCOUNTER — Ambulatory Visit (INDEPENDENT_AMBULATORY_CARE_PROVIDER_SITE_OTHER): Payer: Commercial Managed Care - HMO | Admitting: Obstetrics & Gynecology

## 2016-06-26 VITALS — BP 114/79 | HR 99 | Wt 249.0 lb

## 2016-06-26 DIAGNOSIS — E162 Hypoglycemia, unspecified: Secondary | ICD-10-CM

## 2016-06-26 DIAGNOSIS — Z3689 Encounter for other specified antenatal screening: Secondary | ICD-10-CM

## 2016-06-26 DIAGNOSIS — Z3480 Encounter for supervision of other normal pregnancy, unspecified trimester: Secondary | ICD-10-CM

## 2016-06-26 LAB — GLUCOSE, POCT (MANUAL RESULT ENTRY): POC Glucose: 94 mg/dl (ref 70–99)

## 2016-06-26 NOTE — Progress Notes (Signed)
Has chewable PNV's. Wishes to change to tiny tab. Nuchal completed yesterday - normal. Pt c/o low sugar yesterday. Pt did not eat yet - CBG today  94.

## 2016-06-26 NOTE — Progress Notes (Signed)
   PRENATAL VISIT NOTE  Subjective:  Jamie [name] is a 26 y.o. G2P1001 at [redacted]w[redacted]d being seen today for ongoing prenatal care.  She is currently monitored for the following issues for this low-risk pregnancy and has Supervision of other normal pregnancy, antepartum on her problem list.  Patient reports having "low blood sugar".  Contractions: Not present. Vag. Bleeding: None.  Movement: Present. Denies leaking of fluid.   The following portions of the patient's history were reviewed and updated as appropriate: allergies, current medications, past family history, past medical history, past social history, past surgical history and problem list. Problem list updated.  Objective:   Vitals:   06/26/16 1119  BP: 114/79  Pulse: 99  Weight: 249 lb (112.9 kg)    Fetal Status: Fetal Heart Rate (bpm): 158   Movement: Present     General:  Alert, oriented and cooperative. Patient is in no acute distress.  Skin: Skin is warm and dry. No rash noted.   Cardiovascular: Normal heart rate noted  Respiratory: Normal respiratory effort, no problems with respiration noted  Abdomen: Soft, gravid, appropriate for gestational age. Pain/Pressure: Present     Pelvic:  Cervical exam deferred        Extremities: Normal range of motion.  Edema: None  Mental Status: Normal mood and affect. Normal behavior. Normal judgment and thought content.   Assessment and Plan:  Pregnancy: G2P1001 at [redacted]w[redacted]d  1. Low blood sugar - POCT Glucose (CBG) 94 today - fasting.  Will continue to monitor  2. Encounter for fetal anatomic survey Anatomy scan ordered - US MFM OB COMP + 14 WK; Future  3. Supervision of other normal pregnancy, antepartum Follow up first trimester screen; normal NT yesterday. AFP only next visit. No other complaints or concerns.  Routine obstetric precautions reviewed. Please refer to After Visit Summary for other counseling recommendations.  Return in about 4 weeks (around 07/24/2016) for OB Visit and  AFP only lab.   Tereso NewcomerUgonna A Emiliano Welshans, MD

## 2016-06-26 NOTE — Patient Instructions (Signed)
Return to clinic for any scheduled appointments or obstetric concerns, or go to [facility] for evaluation  

## 2016-07-01 ENCOUNTER — Encounter: Payer: Self-pay | Admitting: *Deleted

## 2016-07-24 ENCOUNTER — Ambulatory Visit (INDEPENDENT_AMBULATORY_CARE_PROVIDER_SITE_OTHER): Payer: Commercial Managed Care - HMO | Admitting: Obstetrics and Gynecology

## 2016-07-24 ENCOUNTER — Encounter: Payer: Self-pay | Admitting: *Deleted

## 2016-07-24 VITALS — BP 107/72 | HR 80 | Wt 250.0 lb

## 2016-07-24 DIAGNOSIS — Z3480 Encounter for supervision of other normal pregnancy, unspecified trimester: Secondary | ICD-10-CM

## 2016-07-24 DIAGNOSIS — Z3482 Encounter for supervision of other normal pregnancy, second trimester: Secondary | ICD-10-CM

## 2016-07-24 NOTE — Addendum Note (Signed)
Addended by: Marya LandryFOSTER, Shamar Kracke D on: 07/24/2016 03:13 PM   Modules accepted: Orders

## 2016-07-24 NOTE — Progress Notes (Signed)
   PRENATAL VISIT NOTE  Subjective:  Jamie [name] is a 26 y.o. G2P1001 at 1450w1d being seen today for ongoing prenatal care.  She is currently monitored for the following issues for this low-risk pregnancy and has Supervision of other normal pregnancy, antepartum on her problem list.  Patient reports no complaints.  Contractions: Irritability. Vag. Bleeding: None.  Movement: Present. Denies leaking of fluid.   The following portions of the patient's history were reviewed and updated as appropriate: allergies, current medications, past family history, past medical history, past social history, past surgical history and problem list. Problem list updated.  Objective:   Vitals:   07/24/16 1437  BP: 107/72  Pulse: 80  Weight: 250 lb (113.4 kg)    Fetal Status: Fetal Heart Rate (bpm): 145   Movement: Present     General:  Alert, oriented and cooperative. Patient is in no acute distress.  Skin: Skin is warm and dry. No rash noted.   Cardiovascular: Normal heart rate noted  Respiratory: Normal respiratory effort, no problems with respiration noted  Abdomen: Soft, gravid, appropriate for gestational age. Pain/Pressure: Absent     Pelvic:  Cervical exam deferred        Extremities: Normal range of motion.     Mental Status: Normal mood and affect. Normal behavior. Normal judgment and thought content.   Assessment and Plan:  Pregnancy: G2P1001 at 6450w1d  1. Supervision of other normal pregnancy, antepartum Patient is doing well without complaints AFP today Follow up anatomy on 08/05/2016 - AFP, Quad Screen  General obstetric precautions including but not limited to vaginal bleeding, contractions, leaking of fluid and fetal movement were reviewed in detail with the patient. Please refer to After Visit Summary for other counseling recommendations.  Return in about 4 weeks (around 08/21/2016) for ROB.   Catalina AntiguaPeggy Meryem Haertel, MD

## 2016-07-24 NOTE — Progress Notes (Signed)
Pt states she is having some SOB and cramping. Pt states she needs note for work to make aware of pregnancy.

## 2016-07-26 LAB — AFP, SERUM, OPEN SPINA BIFIDA
AFP MoM: 0.79
AFP Value: 22.3 ng/mL
GEST. AGE ON COLLECTION DATE: 16.4 wk
MATERNAL AGE AT EDD: 27.4 a
OSBR RISK 1 IN: 10000
TEST RESULTS AFP: NEGATIVE
Weight: 250 [lb_av]

## 2016-08-05 ENCOUNTER — Ambulatory Visit (HOSPITAL_COMMUNITY)
Admission: RE | Admit: 2016-08-05 | Discharge: 2016-08-05 | Disposition: A | Discharge status: Home / Self Care | Payer: Commercial Managed Care - HMO | Source: Ambulatory Visit | Attending: Obstetrics & Gynecology | Admitting: Obstetrics & Gynecology

## 2016-08-05 ENCOUNTER — Other Ambulatory Visit: Payer: Self-pay | Admitting: Obstetrics & Gynecology

## 2016-08-05 DIAGNOSIS — O99212 Obesity complicating pregnancy, second trimester: Secondary | ICD-10-CM

## 2016-08-05 DIAGNOSIS — Z3480 Encounter for supervision of other normal pregnancy, unspecified trimester: Secondary | ICD-10-CM

## 2016-08-05 DIAGNOSIS — Z3A18 18 weeks gestation of pregnancy: Secondary | ICD-10-CM | POA: Insufficient documentation

## 2016-08-05 DIAGNOSIS — Z3689 Encounter for other specified antenatal screening: Secondary | ICD-10-CM

## 2016-08-05 DIAGNOSIS — O321XX0 Maternal care for breech presentation, not applicable or unspecified: Secondary | ICD-10-CM | POA: Insufficient documentation

## 2016-08-21 ENCOUNTER — Ambulatory Visit (INDEPENDENT_AMBULATORY_CARE_PROVIDER_SITE_OTHER): Payer: Commercial Managed Care - HMO | Admitting: Obstetrics and Gynecology

## 2016-08-21 VITALS — BP 124/78 | HR 93 | Wt 254.6 lb

## 2016-08-21 DIAGNOSIS — Z3480 Encounter for supervision of other normal pregnancy, unspecified trimester: Secondary | ICD-10-CM

## 2016-08-21 DIAGNOSIS — Z3482 Encounter for supervision of other normal pregnancy, second trimester: Secondary | ICD-10-CM

## 2016-08-21 NOTE — Progress Notes (Signed)
Last night patient reports she had a lot of pressure lower front uterine segment. Patient still feels winded a lot.

## 2016-08-21 NOTE — Progress Notes (Signed)
   PRENATAL VISIT NOTE  Subjective:  Jamie [name] is a 26 y.o. G2P1001 at [redacted]w[redacted]d being seen today for ongoing prenatal care.  She is currently monitored for the following issues for this low-risk pregnancy and has Supervision of other normal pregnancy, antepartum on her problem list.  Patient reports SOB even at rest or while laying down.  Contractions: Not present. Vag. Bleeding: None.  Movement: Present. Denies leaking of fluid.   The following portions of the patient's history were reviewed and updated as appropriate: allergies, current medications, past family history, past medical history, past social history, past surgical history and problem list. Problem list updated.  Objective:   Vitals:   08/21/16 1451  BP: 124/78  Pulse: 93  Weight: 254 lb 9.6 oz (115.5 kg)    Fetal Status: Fetal Heart Rate (bpm): 154 Fundal Height: 22 cm Movement: Present     General:  Alert, oriented and cooperative. Patient is in no acute distress.  Skin: Skin is warm and dry. No rash noted.   Cardiovascular: Normal heart rate noted  Respiratory: Normal respiratory effort, no problems with respiration noted  Abdomen: Soft, gravid, appropriate for gestational age. Pain/Pressure: Absent     Pelvic:  Cervical exam deferred        Extremities: Normal range of motion.  Edema: None  Mental Status: Normal mood and affect. Normal behavior. Normal judgment and thought content.   Assessment and Plan:  Pregnancy: G2P1001 at [redacted]w[redacted]d  1. Supervision of other normal pregnancy, antepartum Patient denies a history of asthma but was prescribed albuterol in the past- advised patient to use albuterol inhaler when SOB begins Patient also advised to elevate head at bedtime to see if symptoms improve Referral made to cardiology Follow up anatomy ultrasound ordered - US MFM OB FOLLOW UP; Future - Ambulatory referral to Cardiology  Preterm labor symptoms and general obstetric precautions including but not limited to  vaginal bleeding, contractions, leaking of fluid and fetal movement were reviewed in detail with the patient. Please refer to After Visit Summary for other counseling recommendations.  Return in about 4 weeks (around 09/18/2016) for ROB.   Catalina Antigua, MD

## 2016-08-21 NOTE — Patient Instructions (Signed)
Contraception Choices Contraception (birth control) is the use of any methods or devices to prevent pregnancy. Below are some methods to help avoid pregnancy. Hormonal methods  Contraceptive implant. This is a thin, plastic tube containing progesterone hormone. It does not contain estrogen hormone. Your health care provider inserts the tube in the inner part of the upper arm. The tube can remain in place for up to 3 years. After 3 years, the implant must be removed. The implant prevents the ovaries from releasing an egg (ovulation), thickens the cervical mucus to prevent sperm from entering the uterus, and thins the lining of the inside of the uterus.  Progesterone-only injections. These injections are given every 3 months by your health care provider to prevent pregnancy. This synthetic progesterone hormone stops the ovaries from releasing eggs. It also thickens cervical mucus and changes the uterine lining. This makes it harder for sperm to survive in the uterus.  Birth control pills. These pills contain estrogen and progesterone hormone. They work by preventing the ovaries from releasing eggs (ovulation). They also cause the cervical mucus to thicken, preventing the sperm from entering the uterus. Birth control pills are prescribed by a health care provider.Birth control pills can also be used to treat heavy periods.  Minipill. This type of birth control pill contains only the progesterone hormone. They are taken every day of each month and must be prescribed by your health care provider.  Birth control patch. The patch contains hormones similar to those in birth control pills. It must be changed once a week and is prescribed by a health care provider.  Vaginal ring. The ring contains hormones similar to those in birth control pills. It is left in the vagina for 3 weeks, removed for 1 week, and then a new one is put back in place. The patient must be comfortable inserting and removing the ring from  the vagina.A health care provider's prescription is necessary.  Emergency contraception. Emergency contraceptives prevent pregnancy after unprotected sexual intercourse. This pill can be taken right after sex or up to 5 days after unprotected sex. It is most effective the sooner you take the pills after having sexual intercourse. Most emergency contraceptive pills are available without a prescription. Check with your pharmacist. Do not use emergency contraception as your only form of birth control. Barrier methods  Female condom. This is a thin sheath (latex or rubber) that is worn over the penis during sexual intercourse. It can be used with spermicide to increase effectiveness.  Female condom. This is a soft, loose-fitting sheath that is put into the vagina before sexual intercourse.  Diaphragm. This is a soft, latex, dome-shaped barrier that must be fitted by a health care provider. It is inserted into the vagina, along with a spermicidal jelly. It is inserted before intercourse. The diaphragm should be left in the vagina for 6 to 8 hours after intercourse.  Cervical cap. This is a round, soft, latex or plastic cup that fits over the cervix and must be fitted by a health care provider. The cap can be left in place for up to 48 hours after intercourse.  Sponge. This is a soft, circular piece of polyurethane foam. The sponge has spermicide in it. It is inserted into the vagina after wetting it and before sexual intercourse.  Spermicides. These are chemicals that kill or block sperm from entering the cervix and uterus. They come in the form of creams, jellies, suppositories, foam, or tablets. They do not require a prescription. They   are inserted into the vagina with an applicator before having sexual intercourse. The process must be repeated every time you have sexual intercourse. Intrauterine contraception  Intrauterine device ([name]). This is a T-shaped device that is put in a woman's uterus during  a menstrual period to prevent pregnancy. There are 2 types: ? Copper [name]. This type of [name] is wrapped in copper wire and is placed inside the uterus. Copper makes the uterus and fallopian tubes produce a fluid that kills sperm. It can stay in place for 10 years. ? Hormone [name]. This type of [name] contains the hormone progestin (synthetic progesterone). The hormone thickens the cervical mucus and prevents sperm from entering the uterus, and it also thins the uterine lining to prevent implantation of a fertilized egg. The hormone can weaken or kill the sperm that get into the uterus. It can stay in place for 3-5 years, depending on which type of [name] is used. Permanent methods of contraception  Female tubal ligation. This is when the woman's fallopian tubes are surgically sealed, tied, or blocked to prevent the egg from traveling to the uterus.  Hysteroscopic sterilization. This involves placing a small coil or insert into each fallopian tube. Your doctor uses a technique called hysteroscopy to do the procedure. The device causes scar tissue to form. This results in permanent blockage of the fallopian tubes, so the sperm cannot fertilize the egg. It takes about 3 months after the procedure for the tubes to become blocked. You must use another form of birth control for these 3 months.  Female sterilization. This is when the female has the tubes that carry sperm tied off (vasectomy).This blocks sperm from entering the vagina during sexual intercourse. After the procedure, the man can still ejaculate fluid (semen). Natural planning methods  Natural family planning. This is not having sexual intercourse or using a barrier method (condom, diaphragm, cervical cap) on days the woman could become pregnant.  Calendar method. This is keeping track of the length of each menstrual cycle and identifying when you are fertile.  Ovulation method. This is avoiding sexual intercourse during ovulation.  Symptothermal method.  This is avoiding sexual intercourse during ovulation, using a thermometer and ovulation symptoms.  Post-ovulation method. This is timing sexual intercourse after you have ovulated. Regardless of which type or method of contraception you choose, it is important that you use condoms to protect against the transmission of sexually transmitted infections (STIs). Talk with your health care provider about which form of contraception is most appropriate for you. This information is not intended to replace advice given to you by your health care provider. Make sure you discuss any questions you have with your health care provider. Document Released: 04/21/2005 Document Revised: 09/27/2015 Document Reviewed: 10/14/2012 Elsevier Interactive Patient Education  2017 [employer]  

## 2016-08-25 ENCOUNTER — Encounter: Payer: Self-pay | Admitting: Cardiology

## 2016-08-26 ENCOUNTER — Ambulatory Visit (INDEPENDENT_AMBULATORY_CARE_PROVIDER_SITE_OTHER): Payer: Commercial Managed Care - HMO | Admitting: Cardiology

## 2016-08-26 ENCOUNTER — Encounter: Payer: Self-pay | Admitting: Cardiology

## 2016-08-26 ENCOUNTER — Encounter (INDEPENDENT_AMBULATORY_CARE_PROVIDER_SITE_OTHER): Payer: Self-pay

## 2016-08-26 VITALS — BP 122/64 | HR 81 | Ht 69.0 in | Wt 254.0 lb

## 2016-08-26 DIAGNOSIS — R0602 Shortness of breath: Secondary | ICD-10-CM

## 2016-08-26 NOTE — Progress Notes (Signed)
Cardiology Office Note NEW PATIENT VISIT  Date:  08/26/2016   ID:  Jamie [name], DOB 03-29-1991, MRN [id number]  PCP:  No PCP Per Patient  Cardiologist:  New   Dr. Ladona Ridgel DOD  Chief Complaint  Patient presents with  . Shortness of Breath      History of Present Illness: Jamie [name] is a 26 y.o. female who presents for for SOB with exertion and rest at [redacted] weeks pregnant and 6 days G2P1, no SOB with first pregnancy.    She has had hx of asthma in past and she can use her inhaler of albuterol if needed.  She has been SOB with exertion though the other night when she laid down she did not feel she could breathe well.  She did go to sleep though.  She has had problems with allergies with nasal congestion.  This improved after recent rain.  She had some chest pressure with the SOB.  No prior SOB or chest pressure.  She at times may have rapid Heart rate, this has occurred very briefly since she was a child.      History reviewed. No pertinent past medical history.  History reviewed. No pertinent surgical history.   Current Outpatient Prescriptions  Medication Sig Dispense Refill  . Prenatal MV-Min-FA-Omega-3 (PRENATAL GUMMIES/DHA & FA) 0.4-32.5 MG CHEW Chew by mouth.     No current facility-administered medications for this visit.     Allergies:   Shellfish allergy    Social History:  The patient  reports that she has never smoked. She has never used smokeless tobacco. She reports that she does not drink alcohol or use drugs.   Family History:  The patient's family history includes Arrhythmia in her brother; Diabetes in her father and maternal grandfather; Healthy in her brother; Heart attack in her paternal grandmother; Heart disease in her father and maternal grandfather; Heart failure in her father; Heart murmur in her mother; Hypertension in her maternal grandfather; Stroke in her father.    ROS:  General:recent cold no fevers, + weight gain with pregnancy Skin:no  rashes or ulcers HEENT:no blurred vision, no congestion CV:see HPI PUL:see HPI GI:no diarrhea constipation or melena, no indigestion GU:no hematuria, no dysuria MS:no joint pain, no claudication Neuro:no syncope, no lightheadedness Endo:no diabetes, no thyroid disease -hypoglycemia has been present this pregnancy  Wt Readings from Last 3 Encounters:  08/26/16 254 lb (115.2 kg)  08/21/16 254 lb 9.6 oz (115.5 kg)  07/24/16 250 lb (113.4 kg)     PHYSICAL EXAM: VS:  BP 122/64   Pulse 81   Ht  (1.753 m)   Wt 254 lb (115.2 kg)   LMP 03/26/2016   BMI 37.51 kg/m  , BMI Body mass index is 37.51 kg/m. General:Pleasant affect, NAD Skin:Warm and dry, brisk capillary refill HEENT:normocephalic, sclera clear, mucus membranes moist Neck:supple, no JVD, no bruits  Heart:S1S2 RRR without murmur, gallup, rub or click Lungs:clear without rales, rhonchi, or wheezes GEX:BMWU, non tender, + BS, do not palpate liver spleen or masses, + pregnancy  Ext:no lower ext edema, 2+ pedal pulses, 2+ radial pulses Neuro:alert and oriented, MAE, follows commands, + facial symmetry    EKG:  EKG is ordered today. The ekg ordered today demonstrates SR normal EKG   Recent Labs: 05/29/2016: Platelets 316    Lipid Panel No results found for: CHOL, TRIG, HDL, CHOLHDL, VLDL, LDLCALC, LDLDIRECT     Other studies Reviewed: Additional studies/ records that were reviewed today include: prior OV  notes.   ASSESSMENT AND PLAN:  1.  SOB, may be due to Asthma, and pregnancy, will check echo to eval structure.  Discussed with Dr. Ladona Ridgel.  She will follow up for results.    Current medicines are reviewed with the patient today.  The patient Has no concerns regarding medicines.  The following changes have been made:  See above Labs/ tests ordered today include:see above  Disposition:   FU:  see above  Signed, Nada Boozer, NP  08/26/2016 7:23 PM    [facility] [address]  [place], [place], [place]  [address]/ [address] [place], [place] Phone: [phone]; Fax: [phone]  [phone]

## 2016-08-26 NOTE — Patient Instructions (Signed)
Schedule Echocardiogram    Your physician recommends that you schedule a follow-up appointment after Echo with Nada Boozer NP on a day Dr.Taylor in office.

## 2016-08-28 ENCOUNTER — Ambulatory Visit: Payer: Commercial Managed Care - HMO | Admitting: Cardiology

## 2016-09-08 ENCOUNTER — Other Ambulatory Visit (HOSPITAL_COMMUNITY): Payer: Commercial Managed Care - HMO

## 2016-09-08 NOTE — Progress Notes (Deleted)
   Cardiology Office Note   Date:  09/08/2016   ID:  Jamie [name], DOB 04-03-1991, MRN [id number]  PCP:  Patient, No Pcp Per  Cardiologist:  Dr. Ladona Ridgelaylor     No chief complaint on file.     History of Present Illness: Jamie [name] is a 26 y.o. female who presents for follow up of his SOB.  Last seen 08/26/16 with SOB at [redacted] weeks pregnant.  She has a hx of Asthma.  She believes she has rapid HR at times that has occurred since she was a child.    Has not yet had echo.   No past medical history on file.  No past surgical history on file.   Current Outpatient Prescriptions  Medication Sig Dispense Refill  . Prenatal MV-Min-FA-Omega-3 (PRENATAL GUMMIES/DHA & FA) 0.4-32.5 MG CHEW Chew by mouth.     No current facility-administered medications for this visit.     Allergies:   Shellfish allergy    Social History:  The patient  reports that she has never smoked. She has never used smokeless tobacco. She reports that she does not drink alcohol or use drugs.   Family History:  The patient's ***family history includes Arrhythmia in her brother; Diabetes in her father and maternal grandfather; Healthy in her brother; Heart attack in her paternal grandmother; Heart disease in her father and maternal grandfather; Heart failure in her father; Heart murmur in her mother; Hypertension in her maternal grandfather; Stroke in her father.    ROS:  General:no colds or fevers, no weight changes Skin:no rashes or ulcers HEENT:no blurred vision, no congestion CV:see HPI PUL:see HPI GI:no diarrhea constipation or melena, no indigestion GU:no hematuria, no dysuria MS:no joint pain, no claudication Neuro:no syncope, no lightheadedness Endo:no diabetes, no thyroid disease Wt Readings from Last 3 Encounters:  08/26/16 254 lb (115.2 kg)  08/21/16 254 lb 9.6 oz (115.5 kg)  07/24/16 250 lb (113.4 kg)     PHYSICAL EXAM: VS:  LMP 03/26/2016  , BMI There is no height or weight on file to calculate  BMI. General:Pleasant affect, NAD Skin:Warm and dry, brisk capillary refill HEENT:normocephalic, sclera clear, mucus membranes moist Neck:supple, no JVD, no bruits  Heart:S1S2 RRR without murmur, gallup, rub or click Lungs:clear without rales, rhonchi, or wheezes WUJ:WJXBAbd:soft, non tender, + BS, do not palpate liver spleen or masses Ext:no lower ext edema, 2+ pedal pulses, 2+ radial pulses Neuro:alert and oriented, MAE, follows commands, + facial symmetry    EKG:  EKG is ordered today. The ekg ordered today demonstrates ***   Recent Labs: 05/29/2016: Platelets 316    Lipid Panel No results found for: CHOL, TRIG, HDL, CHOLHDL, VLDL, LDLCALC, LDLDIRECT     Other studies Reviewed: Additional studies/ records that were reviewed today include: ***.   ASSESSMENT AND PLAN:  1.  ***   Current medicines are reviewed with the patient today.  The patient Has no concerns regarding medicines.  The following changes have been made:  See above Labs/ tests ordered today include:see above  Disposition:   FU:  see above  Signed, Nada BoozerLaura Ingold, NP  09/08/2016 7:25 PM    [facility] [address], [place], [place]  [address]/ [address] [place], [place] Phone: [phone]; Fax: [phone]  [phone]

## 2016-09-09 ENCOUNTER — Ambulatory Visit (HOSPITAL_COMMUNITY)
Admission: RE | Admit: 2016-09-09 | Discharge: 2016-09-09 | Disposition: A | Discharge status: Home / Self Care | Payer: Commercial Managed Care - HMO | Source: Ambulatory Visit | Attending: Obstetrics and Gynecology | Admitting: Obstetrics and Gynecology

## 2016-09-09 ENCOUNTER — Encounter (INDEPENDENT_AMBULATORY_CARE_PROVIDER_SITE_OTHER): Payer: Self-pay

## 2016-09-09 ENCOUNTER — Ambulatory Visit: Payer: Commercial Managed Care - HMO | Admitting: Cardiology

## 2016-09-09 ENCOUNTER — Other Ambulatory Visit: Payer: Self-pay | Admitting: Obstetrics and Gynecology

## 2016-09-09 DIAGNOSIS — Z048 Encounter for examination and observation for other specified reasons: Secondary | ICD-10-CM | POA: Diagnosis present

## 2016-09-09 DIAGNOSIS — Z0489 Encounter for examination and observation for other specified reasons: Secondary | ICD-10-CM

## 2016-09-09 DIAGNOSIS — IMO0002 Reserved for concepts with insufficient information to code with codable children: Secondary | ICD-10-CM

## 2016-09-09 DIAGNOSIS — O99212 Obesity complicating pregnancy, second trimester: Secondary | ICD-10-CM

## 2016-09-09 DIAGNOSIS — Z362 Encounter for other antenatal screening follow-up: Secondary | ICD-10-CM | POA: Diagnosis not present

## 2016-09-09 DIAGNOSIS — Z3A23 23 weeks gestation of pregnancy: Secondary | ICD-10-CM | POA: Diagnosis not present

## 2016-09-09 DIAGNOSIS — Z3480 Encounter for supervision of other normal pregnancy, unspecified trimester: Secondary | ICD-10-CM

## 2016-09-11 ENCOUNTER — Ambulatory Visit (INDEPENDENT_AMBULATORY_CARE_PROVIDER_SITE_OTHER): Payer: Commercial Managed Care - HMO | Admitting: Certified Nurse Midwife

## 2016-09-11 DIAGNOSIS — Z3482 Encounter for supervision of other normal pregnancy, second trimester: Secondary | ICD-10-CM

## 2016-09-11 DIAGNOSIS — Z3480 Encounter for supervision of other normal pregnancy, unspecified trimester: Secondary | ICD-10-CM

## 2016-09-11 NOTE — Progress Notes (Signed)
Patient reports good fetal movement, denies pain. 

## 2016-09-11 NOTE — Progress Notes (Signed)
   PRENATAL VISIT NOTE  Subjective:  Jamie [name] is a 26 y.o. G2P1001 at 5151w1d being seen today for ongoing prenatal care.  She is currently monitored for the following issues for this low-risk pregnancy and has Supervision of other normal pregnancy, antepartum on her problem list.  Patient reports no complaints.  Contractions: Not present. Vag. Bleeding: None.  Movement: Present. Denies leaking of fluid.   The following portions of the patient's history were reviewed and updated as appropriate: allergies, current medications, past family history, past medical history, past social history, past surgical history and problem list. Problem list updated.  Objective:   Vitals:   09/11/16 1321  BP: 109/74  Pulse: 87  Weight: 256 lb (116.1 kg)    Fetal Status: Fetal Heart Rate (bpm): 151 Fundal Height: 24 cm Movement: Present     General:  Alert, oriented and cooperative. Patient is in no acute distress.  Skin: Skin is warm and dry. No rash noted.   Cardiovascular: Normal heart rate noted  Respiratory: Normal respiratory effort, no problems with respiration noted  Abdomen: Soft, gravid, appropriate for gestational age. Pain/Pressure: Absent     Pelvic:  Cervical exam deferred        Extremities: Normal range of motion.  Edema: None  Mental Status: Normal mood and affect. Normal behavior. Normal judgment and thought content.   Assessment and Plan:  Pregnancy: G2P1001 at 6851w1d  1. Supervision of other normal pregnancy, antepartum Patient reports doing well.   Preterm labor symptoms and general obstetric precautions including but not limited to vaginal bleeding, contractions, leaking of fluid and fetal movement were reviewed in detail with the patient. Please refer to After Visit Summary for other counseling recommendations.  Return in about 4 weeks (around 10/09/2016) for ROB, 2 hr OGTT.   Meiah Zamudio, Allene PyoShanika K, Student-MidWife

## 2016-09-17 ENCOUNTER — Encounter (HOSPITAL_COMMUNITY): Payer: Self-pay | Admitting: *Deleted

## 2016-09-17 ENCOUNTER — Inpatient Hospital Stay (HOSPITAL_COMMUNITY)
Admission: AD | Admit: 2016-09-17 | Discharge: 2016-09-17 | Disposition: A | Discharge status: Home / Self Care | Payer: Commercial Managed Care - HMO | Source: Ambulatory Visit | Attending: Obstetrics and Gynecology | Admitting: Obstetrics and Gynecology

## 2016-09-17 ENCOUNTER — Inpatient Hospital Stay (HOSPITAL_COMMUNITY): Payer: Commercial Managed Care - HMO

## 2016-09-17 DIAGNOSIS — O26892 Other specified pregnancy related conditions, second trimester: Secondary | ICD-10-CM | POA: Diagnosis present

## 2016-09-17 DIAGNOSIS — O9989 Other specified diseases and conditions complicating pregnancy, childbirth and the puerperium: Secondary | ICD-10-CM

## 2016-09-17 DIAGNOSIS — R059 Cough, unspecified: Secondary | ICD-10-CM

## 2016-09-17 DIAGNOSIS — Z3A25 25 weeks gestation of pregnancy: Secondary | ICD-10-CM | POA: Insufficient documentation

## 2016-09-17 DIAGNOSIS — R0602 Shortness of breath: Secondary | ICD-10-CM | POA: Diagnosis not present

## 2016-09-17 DIAGNOSIS — R05 Cough: Secondary | ICD-10-CM

## 2016-09-17 DIAGNOSIS — J069 Acute upper respiratory infection, unspecified: Secondary | ICD-10-CM | POA: Insufficient documentation

## 2016-09-17 DIAGNOSIS — Z3480 Encounter for supervision of other normal pregnancy, unspecified trimester: Secondary | ICD-10-CM

## 2016-09-17 LAB — URINALYSIS, ROUTINE W REFLEX MICROSCOPIC
Bilirubin Urine: NEGATIVE
GLUCOSE, UA: NEGATIVE mg/dL
HGB URINE DIPSTICK: NEGATIVE
Ketones, ur: NEGATIVE mg/dL
NITRITE: NEGATIVE
PH: 7 (ref 5.0–8.0)
Protein, ur: NEGATIVE mg/dL
Specific Gravity, Urine: 1.019 (ref 1.005–1.030)

## 2016-09-17 NOTE — MAU Note (Signed)
Been having a bad cough since Monday.  Started coughing up thick, green mucous today. Has pain at times when she coughs. iis starting to lose her voice.  Denies fever.  Hx of allergy problems.

## 2016-09-17 NOTE — MAU Provider Note (Signed)
History     CSN: [id number]  Arrival date and time: 09/17/16 1521   First Provider Initiated Contact with Patient 09/17/16 1815      Chief Complaint  Patient presents with  . productive cough   G2P1001 @25  weeks here with SOB and cough. Cough started 2 days ago and she is producing thick yellow sputum. No fevers. Some pain midsternal only with coughing. She reports 1 month of SOB and has a current evaluation in process. EKG was normal and has echo scheduled for tomorrow. No pregnancy complaints. Good FM.     OB History    Gravida Para Term Preterm AB Living   2 1 1     1    SAB TAB Ectopic Multiple Live Births           1      History reviewed. No pertinent past medical history.  History reviewed. No pertinent surgical history.  Family History  Problem Relation Age of Onset  . Heart murmur Mother   . Diabetes Father   . Stroke Father   . Heart disease Father   . Heart failure Father   . Diabetes Maternal Grandfather   . Heart disease Maternal Grandfather   . Hypertension Maternal Grandfather   . Heart attack Paternal Grandmother   . Arrhythmia Brother   . Healthy Brother     Social History  Substance Use Topics  . Smoking status: Never Smoker  . Smokeless tobacco: Never Used  . Alcohol use No    Allergies:  Allergies  Allergen Reactions  . Shellfish Allergy Swelling    Throat     Prescriptions Prior to Admission  Medication Sig Dispense Refill Last Dose  . Prenatal MV-Min-FA-Omega-3 (PRENATAL GUMMIES/DHA & FA) 0.4-32.5 MG CHEW Chew by mouth.   Taking    Review of Systems  Constitutional: Negative for fever.  HENT: Positive for voice change. Negative for ear pain, rhinorrhea, sinus pressure and sore throat.   Respiratory: Positive for cough and shortness of breath.   Gastrointestinal: Negative for abdominal pain.   Physical Exam   Blood pressure 110/66, pulse 96, temperature 98.5 F (36.9 C), resp. rate 18, weight 116.1 kg (256 lb), last  menstrual period 03/26/2016, SpO2 100 %.  Physical Exam  Nursing note and vitals reviewed. Constitutional: She is oriented to person, place, and time. She appears well-developed and well-nourished. No distress.  HENT:  Head: Normocephalic and atraumatic.  Right Ear: Hearing, tympanic membrane, external ear and ear canal normal.  Left Ear: Hearing, tympanic membrane, external ear and ear canal normal.  Nose: Nose normal. Right sinus exhibits no maxillary sinus tenderness and no frontal sinus tenderness. Left sinus exhibits no maxillary sinus tenderness and no frontal sinus tenderness.  Mouth/Throat: Uvula is midline and mucous membranes are normal. Posterior oropharyngeal erythema (mild) present. No oropharyngeal exudate, posterior oropharyngeal edema or tonsillar abscesses.  Neck: Normal range of motion. Neck supple.  Cardiovascular: Normal rate, regular rhythm and normal heart sounds.   Respiratory: Effort normal and breath sounds normal. No respiratory distress. She has no wheezes. She has no rales.  GI: Soft. She exhibits no distension. There is no tenderness.  gravid  Musculoskeletal: Normal range of motion.  Neurological: She is alert and oriented to person, place, and time.  Skin: Skin is warm and dry.  Psychiatric: She has a normal mood and affect.   EFM: 140 bpm, mod variability, no accels, no decels Toco: none  Dg Chest 2 View  Result Date: 09/17/2016 CLINICAL  DATA:  Shortness of breath and cough for 3 days. EXAM: CHEST  2 VIEW COMPARISON:  09/16/2013 radiographs FINDINGS: The cardiomediastinal silhouette is unremarkable. There is no evidence of focal airspace disease, pulmonary edema, suspicious pulmonary nodule/mass, pleural effusion, or pneumothorax. No acute bony abnormalities are identified. IMPRESSION: No active cardiopulmonary disease. Electronically Signed   By: Harmon PierJeffrey  Hu M.D.   On: 09/17/2016 18:56   MAU Course  Procedures  MDM Xray ordered and reviewed. No  evidence of pneumonia. Sx likely caused by UR virus. Will treat with supportive measures. Stable for discharge home.  Assessment and Plan   1. Supervision of other normal pregnancy, antepartum   2. Cough   3. SOB (shortness of breath)   4. [redacted] weeks gestation of pregnancy   5. Upper respiratory virus    Discharge home Mucinex Sudafed Increase water intake Rest Return precautions Follow up tomorrow for echo as scheduled Follow up in OB office as scheduled  Allergies as of 09/17/2016      Reactions   Shellfish Allergy Swelling   Throat      Medication List    TAKE these medications   PRENATAL GUMMIES/DHA & FA 0.4-32.5 MG Chew Chew by mouth.      Donette LarryMelanie Xayvier Vallez, CNM 09/17/2016, 6:34 PM

## 2016-09-17 NOTE — Discharge Instructions (Signed)
Upper Respiratory Infection, Adult Most upper respiratory infections (URIs) are caused by a virus. A URI affects the nose, throat, and upper air passages. The most common type of URI is often called "the common cold." Follow these instructions at home:  Take medicines only as told by your doctor.  Gargle warm saltwater or take cough drops to comfort your throat as told by your doctor.  Use a warm mist humidifier or inhale steam from a shower to increase air moisture. This may make it easier to breathe.  Drink enough fluid to keep your pee (urine) clear or pale yellow.  Eat soups and other clear broths.  Have a healthy diet.  Rest as needed.  Go back to work when your fever is gone or your doctor says it is okay.  You may need to stay home longer to avoid giving your URI to others.  You can also wear a face mask and wash your hands often to prevent spread of the virus.  Use your inhaler more if you have asthma.  Do not use any tobacco products, including cigarettes, chewing tobacco, or electronic cigarettes. If you need help quitting, ask your doctor. Contact a doctor if:  You are getting worse, not better.  Your symptoms are not helped by medicine.  You have chills.  You are getting more short of breath.  You have brown or red mucus.  You have yellow or brown discharge from your nose.  You have pain in your face, especially when you bend forward.  You have a fever.  You have puffy (swollen) neck glands.  You have pain while swallowing.  You have white areas in the back of your throat. Get help right away if:  You have very bad or constant:  Headache.  Ear pain.  Pain in your forehead, behind your eyes, and over your cheekbones (sinus pain).  Chest pain.  You have long-lasting (chronic) lung disease and any of the following:  Wheezing.  Long-lasting cough.  Coughing up blood.  A change in your usual mucus.  You have a stiff neck.  You have  changes in your:  Vision.  Hearing.  Thinking.  Mood. This information is not intended to replace advice given to you by your health care provider. Make sure you discuss any questions you have with your health care provider. Document Released: 10/08/2007 Document Revised: 12/23/2015 Document Reviewed: 07/27/2013 Elsevier Interactive Patient Education  2017 [employer]   Mucinex Sudafed

## 2016-09-18 ENCOUNTER — Other Ambulatory Visit: Payer: Self-pay

## 2016-09-18 ENCOUNTER — Ambulatory Visit (HOSPITAL_COMMUNITY): Payer: Commercial Managed Care - HMO | Attending: Cardiovascular Disease

## 2016-09-18 DIAGNOSIS — R0602 Shortness of breath: Secondary | ICD-10-CM | POA: Diagnosis not present

## 2016-09-22 ENCOUNTER — Encounter: Payer: Self-pay | Admitting: Cardiology

## 2016-09-22 ENCOUNTER — Telehealth: Payer: Self-pay | Admitting: *Deleted

## 2016-09-22 ENCOUNTER — Ambulatory Visit (INDEPENDENT_AMBULATORY_CARE_PROVIDER_SITE_OTHER): Payer: Commercial Managed Care - HMO | Admitting: Cardiology

## 2016-09-22 VITALS — BP 118/72 | HR 68 | Ht 70.0 in | Wt 253.4 lb

## 2016-09-22 DIAGNOSIS — J069 Acute upper respiratory infection, unspecified: Secondary | ICD-10-CM | POA: Diagnosis not present

## 2016-09-22 DIAGNOSIS — R0602 Shortness of breath: Secondary | ICD-10-CM

## 2016-09-22 NOTE — Patient Instructions (Addendum)
Medication Instructions:  Your physician recommends that you continue on your current medications as directed. Please refer to the Current Medication list given to you today.   Ok to take Robitussin DM as needed, and your Albuterol inhaler as needed as directed  Labwork: None ordered  Testing/Procedures: None ordered  Follow-Up: Your physician recommends that you schedule a follow-up appointment as needed with Dr.Taylor or Dr.Ross   Any Other Special Instructions Will Be Listed Below (If Applicable).     If you need a refill on your cardiac medications before your next appointment, please call your pharmacy.

## 2016-09-22 NOTE — Telephone Encounter (Signed)
Patient was at MAU and was told to take Mucinex after she had negative chest x-ray.It has been over a week now and she is worse. Patient has now started wheezing. She had a echo today and it was normal. They told her she may need to get an antibiotic. Mucus is greenish color now. She is "Solicitorhacking"alot. She is nervous about taking all of this medication.

## 2016-09-22 NOTE — Progress Notes (Signed)
Cardiology Office Note   Date:  09/22/2016   ID:  Jamie [name], DOB [birth year], MRN [id number]  PCP:  Patient, No Pcp Per  Cardiologist:  Dr. Ladona Ridgel    Chief Complaint  Patient presents with  . Shortness of Breath      History of Present Illness: Jamie [name] is a 26 y.o. female who presents for follow up of SOB with pregnancy.  G2P1, is [redacted] weeks pregnant.   Hx of asthma.  She had been SOB with exertion, with none on first pregnancy.  She also has a hx of rapid HR prn and has had since she was a child, very brief.    Echo was done and was normal.  EF 60-65%, no RWMA. LV diastolic function parameters were normal.  No PFO.   Today only SOB is with cold/ bronchitis.  She was seen at [facility] last week, CXR was done no PNA, no edema no acute cardiac pulmonary disease.   She was coughing up yellow sputum at that time.  Today it has progressed to green mucus.  She wakes at night coughing and having trouble with tightness, catching her breath, she has not used her albuterol inhaler.  No past medical history on file.  No past surgical history on file.   Current Outpatient Prescriptions  Medication Sig Dispense Refill  . guaiFENesin (MUCINEX) 600 MG 12 hr tablet Take 600 mg by mouth 2 (two) times daily.    . Prenatal MV-Min-FA-Omega-3 (PRENATAL GUMMIES/DHA & FA) 0.4-32.5 MG CHEW 1 chew by mouth daily     No current facility-administered medications for this visit.     Allergies:   Shellfish allergy    Social History:  The patient  reports that she has never smoked. She has never used smokeless tobacco. She reports that she does not drink alcohol or use drugs.   Family History:  The patient's family history includes Arrhythmia in her brother; Diabetes in her father and maternal grandfather; Healthy in her brother; Heart attack in her paternal grandmother; Heart disease in her father and maternal grandfather; Heart failure in her father; Heart murmur in her mother;  Hypertension in her maternal grandfather; Stroke in her father.    ROS:  General:+ colds no fevers, no weight changes Skin:no rashes or ulcers HEENT:no blurred vision, no congestion CV:see HPI PUL:see HPI- + cough, productive with green mucus, tightness with breathing GI:no diarrhea constipation or melena, no indigestion GU:no hematuria, no dysuria MS:no joint pain, no claudication Neuro:no syncope, no lightheadedness Endo:no diabetes, no thyroid disease  Wt Readings from Last 3 Encounters:  09/22/16 253 lb 6.4 oz (114.9 kg)  09/17/16 256 lb (116.1 kg)  09/11/16 256 lb (116.1 kg)     PHYSICAL EXAM: VS:  BP 118/72   Pulse 68   Ht 5\' 10"  (1.778 m)   Wt 253 lb 6.4 oz (114.9 kg)   LMP 03/26/2016   BMI 36.36 kg/m  , BMI Body mass index is 36.36 kg/m. General:Pleasant affect, NAD Skin:Warm and dry, brisk capillary refill HEENT:normocephalic, sclera clear, mucus membranes moist Neck:supple, no JVD, no bruits  Heart:S1S2 RRR without murmur, gallup, rub or click Lungs:+ rhonchi and occ wheeze. without rales YNW:GNFA, non tender, + BS, do not palpate liver spleen or masses Ext:no lower ext edema, 2+ pedal pulses, 2+ radial pulses Neuro:alert and oriented X 3, MAE, follows commands, + facial symmetry    EKG:  EKG is NOT ordered today.    Recent Labs: 05/29/2016: Platelets 316  Lipid Panel No results found for: CHOL, TRIG, HDL, CHOLHDL, VLDL, LDLCALC, LDLDIRECT     Other studies Reviewed: Additional studies/ records that were reviewed today include: . ECHO: Study Conclusions  - Left ventricle: The cavity size was normal. Systolic function was   normal. The estimated ejection fraction was in the range of 60%   to 65%. Wall motion was normal; there were no regional wall   motion abnormalities. Left ventricular diastolic function   parameters were normal. - Atrial septum: No defect or patent foramen ovale was identified.   ASSESSMENT AND PLAN:  1.  SOB normal  echo. Non cardiac reason for SOB. Follow up with cardiology PRN.  2. Now with URI maybe bronchitis, her father also has.  + green mucus. + rhonchi and wheezes.  She will use her inhaler, she was reassured.  I will send note to Dr. Jolayne Panther to see if she wants to treat pt with abx.   Current medicines are reviewed with the patient today.  The patient Has no concerns regarding medicines.  The following changes have been made:  See above Labs/ tests ordered today include:see above  Disposition:   FU:  see above  Signed, Nada Boozer, NP  09/22/2016 1:47 PM    [facility] [address] [place], [place], [place]  [address]/ [address] [place], [place] Phone: [phone]; Fax: [phone]  [phone]

## 2016-09-23 ENCOUNTER — Other Ambulatory Visit: Payer: Self-pay | Admitting: Certified Nurse Midwife

## 2016-09-23 DIAGNOSIS — J069 Acute upper respiratory infection, unspecified: Secondary | ICD-10-CM

## 2016-09-23 MED ORDER — BENZONATATE 100 MG PO CAPS: 100.0000 mg | ORAL_CAPSULE | Freq: Three times a day (TID) | ORAL | 0 refills | Status: DC | PRN

## 2016-09-23 MED ORDER — AMOXICILLIN-POT CLAVULANATE 875-125 MG PO TABS: 1.0000 | ORAL_TABLET | Freq: Two times a day (BID) | ORAL | 0 refills | Status: DC

## 2016-09-23 NOTE — Telephone Encounter (Signed)
Please let her know that I have sent in Augmentin and Tessalon pearls for her to take.  She can also take OTC Benadryl, Robitussin DM for the congestion/cough.  Thank you.  R.Annisa Mazzarella CNM

## 2016-09-24 ENCOUNTER — Encounter: Payer: Self-pay | Admitting: *Deleted

## 2016-09-24 NOTE — Telephone Encounter (Signed)
LM on VM- Rx at pharmacy- patient was seen at other provider yesterday

## 2016-10-09 ENCOUNTER — Other Ambulatory Visit: Payer: Commercial Managed Care - HMO

## 2016-10-09 ENCOUNTER — Ambulatory Visit (INDEPENDENT_AMBULATORY_CARE_PROVIDER_SITE_OTHER): Payer: Commercial Managed Care - HMO | Admitting: Certified Nurse Midwife

## 2016-10-09 DIAGNOSIS — Z3482 Encounter for supervision of other normal pregnancy, second trimester: Secondary | ICD-10-CM

## 2016-10-09 DIAGNOSIS — Z3480 Encounter for supervision of other normal pregnancy, unspecified trimester: Secondary | ICD-10-CM

## 2016-10-09 NOTE — Progress Notes (Signed)
Patient reports good fetal movement, denies pain. 

## 2016-10-09 NOTE — Patient Instructions (Addendum)
Contraception Choices Contraception (birth control) is the use of any methods or devices to prevent pregnancy. Below are some methods to help avoid pregnancy. Hormonal methods  Contraceptive implant. This is a thin, plastic tube containing progesterone hormone. It does not contain estrogen hormone. Your health care provider inserts the tube in the inner part of the upper arm. The tube can remain in place for up to 3 years. After 3 years, the implant must be removed. The implant prevents the ovaries from releasing an egg (ovulation), thickens the cervical mucus to prevent sperm from entering the uterus, and thins the lining of the inside of the uterus.  Progesterone-only injections. These injections are given every 3 months by your health care provider to prevent pregnancy. This synthetic progesterone hormone stops the ovaries from releasing eggs. It also thickens cervical mucus and changes the uterine lining. This makes it harder for sperm to survive in the uterus.  Birth control pills. These pills contain estrogen and progesterone hormone. They work by preventing the ovaries from releasing eggs (ovulation). They also cause the cervical mucus to thicken, preventing the sperm from entering the uterus. Birth control pills are prescribed by a health care provider.Birth control pills can also be used to treat heavy periods.  Minipill. This type of birth control pill contains only the progesterone hormone. They are taken every day of each month and must be prescribed by your health care provider.  Birth control patch. The patch contains hormones similar to those in birth control pills. It must be changed once a week and is prescribed by a health care provider.  Vaginal ring. The ring contains hormones similar to those in birth control pills. It is left in the vagina for 3 weeks, removed for 1 week, and then a new one is put back in place. The patient must be comfortable inserting and removing the ring from  the vagina.A health care provider's prescription is necessary.  Emergency contraception. Emergency contraceptives prevent pregnancy after unprotected sexual intercourse. This pill can be taken right after sex or up to 5 days after unprotected sex. It is most effective the sooner you take the pills after having sexual intercourse. Most emergency contraceptive pills are available without a prescription. Check with your pharmacist. Do not use emergency contraception as your only form of birth control. Barrier methods  Female condom. This is a thin sheath (latex or rubber) that is worn over the penis during sexual intercourse. It can be used with spermicide to increase effectiveness.  Female condom. This is a soft, loose-fitting sheath that is put into the vagina before sexual intercourse.  Diaphragm. This is a soft, latex, dome-shaped barrier that must be fitted by a health care provider. It is inserted into the vagina, along with a spermicidal jelly. It is inserted before intercourse. The diaphragm should be left in the vagina for 6 to 8 hours after intercourse.  Cervical cap. This is a round, soft, latex or plastic cup that fits over the cervix and must be fitted by a health care provider. The cap can be left in place for up to 48 hours after intercourse.  Sponge. This is a soft, circular piece of polyurethane foam. The sponge has spermicide in it. It is inserted into the vagina after wetting it and before sexual intercourse.  Spermicides. These are chemicals that kill or block sperm from entering the cervix and uterus. They come in the form of creams, jellies, suppositories, foam, or tablets. They do not require a prescription. They   are inserted into the vagina with an applicator before having sexual intercourse. The process must be repeated every time you have sexual intercourse. Intrauterine contraception  Intrauterine device ([name]). This is a T-shaped device that is put in a woman's uterus during  a menstrual period to prevent pregnancy. There are 2 types: ? Copper [name]. This type of [name] is wrapped in copper wire and is placed inside the uterus. Copper makes the uterus and fallopian tubes produce a fluid that kills sperm. It can stay in place for 10 years. ? Hormone [name]. This type of [name] contains the hormone progestin (synthetic progesterone). The hormone thickens the cervical mucus and prevents sperm from entering the uterus, and it also thins the uterine lining to prevent implantation of a fertilized egg. The hormone can weaken or kill the sperm that get into the uterus. It can stay in place for 3-5 years, depending on which type of [name] is used. Permanent methods of contraception  Female tubal ligation. This is when the woman's fallopian tubes are surgically sealed, tied, or blocked to prevent the egg from traveling to the uterus.  Hysteroscopic sterilization. This involves placing a small coil or insert into each fallopian tube. Your doctor uses a technique called hysteroscopy to do the procedure. The device causes scar tissue to form. This results in permanent blockage of the fallopian tubes, so the sperm cannot fertilize the egg. It takes about 3 months after the procedure for the tubes to become blocked. You must use another form of birth control for these 3 months.  Female sterilization. This is when the female has the tubes that carry sperm tied off (vasectomy).This blocks sperm from entering the vagina during sexual intercourse. After the procedure, the man can still ejaculate fluid (semen). Natural planning methods  Natural family planning. This is not having sexual intercourse or using a barrier method (condom, diaphragm, cervical cap) on days the woman could become pregnant.  Calendar method. This is keeping track of the length of each menstrual cycle and identifying when you are fertile.  Ovulation method. This is avoiding sexual intercourse during ovulation.  Symptothermal method.  This is avoiding sexual intercourse during ovulation, using a thermometer and ovulation symptoms.  Post-ovulation method. This is timing sexual intercourse after you have ovulated. Regardless of which type or method of contraception you choose, it is important that you use condoms to protect against the transmission of sexually transmitted infections (STIs). Talk with your health care provider about which form of contraception is most appropriate for you. This information is not intended to replace advice given to you by your health care provider. Make sure you discuss any questions you have with your health care provider. Document Released: 04/21/2005 Document Revised: 09/27/2015 Document Reviewed: 10/14/2012 Elsevier Interactive Patient Education  2017 [employer]  

## 2016-10-10 ENCOUNTER — Other Ambulatory Visit: Payer: Self-pay | Admitting: Certified Nurse Midwife

## 2016-10-10 DIAGNOSIS — O99013 Anemia complicating pregnancy, third trimester: Secondary | ICD-10-CM

## 2016-10-10 LAB — CBC
Hematocrit: 32.3 % — ABNORMAL LOW (ref 34.0–46.6)
Hemoglobin: 10.5 g/dL — ABNORMAL LOW (ref 11.1–15.9)
MCH: 30.5 pg (ref 26.6–33.0)
MCHC: 32.5 g/dL (ref 31.5–35.7)
MCV: 94 fL (ref 79–97)
Platelets: 243 10*3/uL (ref 150–379)
RBC: 3.44 x10E6/uL — ABNORMAL LOW (ref 3.77–5.28)
RDW: 14.1 % (ref 12.3–15.4)
WBC: 8 10*3/uL (ref 3.4–10.8)

## 2016-10-10 LAB — RPR: RPR: NONREACTIVE

## 2016-10-10 LAB — GLUCOSE TOLERANCE, 2 HOURS W/ 1HR
GLUCOSE, 1 HOUR: 99 mg/dL (ref 65–179)
GLUCOSE, FASTING: 75 mg/dL (ref 65–91)
Glucose, 2 hour: 82 mg/dL (ref 65–152)

## 2016-10-10 LAB — HIV ANTIBODY (ROUTINE TESTING W REFLEX): HIV SCREEN 4TH GENERATION: NONREACTIVE

## 2016-10-10 MED ORDER — CITRANATAL BLOOM 90-1 MG PO TABS: 1.0000 | ORAL_TABLET | Freq: Every day | ORAL | 12 refills | Status: DC

## 2016-10-10 NOTE — Progress Notes (Signed)
   PRENATAL VISIT NOTE  Subjective:  Jamie [name] is a 26 y.o. G2P1001 at 8460w2d being seen today for ongoing prenatal care.  She is currently monitored for the following issues for this low-risk pregnancy and has Supervision of other normal pregnancy, antepartum on her problem list.  Patient reports no complaints.  Contractions: Not present. Vag. Bleeding: None.  Movement: Present. Denies leaking of fluid.   The following portions of the patient's history were reviewed and updated as appropriate: allergies, current medications, past family history, past medical history, past social history, past surgical history and problem list. Problem list updated.  Objective:   Vitals:   10/09/16 0913  BP: 110/71  Pulse: 90  Weight: 258 lb 3.2 oz (117.1 kg)    Fetal Status: Fetal Heart Rate (bpm): 143 Fundal Height: 28 cm Movement: Present     General:  Alert, oriented and cooperative. Patient is in no acute distress.  Skin: Skin is warm and dry. No rash noted.   Cardiovascular: Normal heart rate noted  Respiratory: Normal respiratory effort, no problems with respiration noted  Abdomen: Soft, gravid, appropriate for gestational age. Pain/Pressure: Absent     Pelvic:  Cervical exam deferred        Extremities: Normal range of motion.  Edema: None  Mental Status: Normal mood and affect. Normal behavior. Normal judgment and thought content.   Assessment and Plan:  Pregnancy: G2P1001 at 5960w2d  1. Supervision of other normal pregnancy, antepartum      Doing well - Glucose Tolerance, 2 Hours w/1 Hour - HIV antibody - RPR - CBC  Preterm labor symptoms and general obstetric precautions including but not limited to vaginal bleeding, contractions, leaking of fluid and fetal movement were reviewed in detail with the patient. Please refer to After Visit Summary for other counseling recommendations.  Return in about 2 weeks (around 10/23/2016) for ROB.   Roe Coombsachelle A Izaak Sahr, CNM

## 2016-10-20 ENCOUNTER — Other Ambulatory Visit: Payer: Self-pay | Admitting: Student

## 2016-10-20 ENCOUNTER — Inpatient Hospital Stay (HOSPITAL_COMMUNITY)
Admission: AD | Admit: 2016-10-20 | Discharge: 2016-10-20 | Disposition: A | Discharge status: Home / Self Care | Payer: Commercial Managed Care - HMO | Source: Ambulatory Visit | Attending: Obstetrics & Gynecology | Admitting: Obstetrics & Gynecology

## 2016-10-20 ENCOUNTER — Encounter (HOSPITAL_COMMUNITY): Payer: Self-pay

## 2016-10-20 DIAGNOSIS — Z79899 Other long term (current) drug therapy: Secondary | ICD-10-CM | POA: Insufficient documentation

## 2016-10-20 DIAGNOSIS — N949 Unspecified condition associated with female genital organs and menstrual cycle: Secondary | ICD-10-CM | POA: Diagnosis not present

## 2016-10-20 DIAGNOSIS — Z833 Family history of diabetes mellitus: Secondary | ICD-10-CM | POA: Diagnosis not present

## 2016-10-20 DIAGNOSIS — R102 Pelvic and perineal pain: Secondary | ICD-10-CM | POA: Diagnosis not present

## 2016-10-20 DIAGNOSIS — Z3A30 30 weeks gestation of pregnancy: Secondary | ICD-10-CM | POA: Insufficient documentation

## 2016-10-20 DIAGNOSIS — Z91013 Allergy to seafood: Secondary | ICD-10-CM | POA: Diagnosis not present

## 2016-10-20 DIAGNOSIS — Z8249 Family history of ischemic heart disease and other diseases of the circulatory system: Secondary | ICD-10-CM | POA: Diagnosis not present

## 2016-10-20 DIAGNOSIS — Z823 Family history of stroke: Secondary | ICD-10-CM | POA: Diagnosis not present

## 2016-10-20 DIAGNOSIS — O26893 Other specified pregnancy related conditions, third trimester: Secondary | ICD-10-CM | POA: Diagnosis not present

## 2016-10-20 LAB — URINALYSIS, ROUTINE W REFLEX MICROSCOPIC
BILIRUBIN URINE: NEGATIVE
GLUCOSE, UA: NEGATIVE mg/dL
HGB URINE DIPSTICK: NEGATIVE
Ketones, ur: NEGATIVE mg/dL
NITRITE: NEGATIVE
PH: 7 (ref 5.0–8.0)
Protein, ur: NEGATIVE mg/dL
SPECIFIC GRAVITY, URINE: 1.014 (ref 1.005–1.030)

## 2016-10-20 NOTE — Discharge Instructions (Signed)

## 2016-10-20 NOTE — MAU Note (Signed)
Pt reports a lot of pelvic pressure. States she feels it mostly when she walks or when baby moves. Pt denies contractions, vaginal bleeding or LOF. Reports a small amount of white discharge. Pt denies urinary s/s, or constipation or diarrhea.

## 2016-10-20 NOTE — MAU Provider Note (Signed)
Patient Jamie [name] is a 26 y.o. G2P1001 At [redacted]w[redacted]d here with complaints of "feeling like the baby is falling out". She has felt this way for several days but it has gotten worse over the past few days. She also has some sharp pains in her hips. She denies bleeding, leaking of fluid, dysuria, abnormal discharge or abdominal pain.  History     CSN: [id number]  Arrival date and time: 10/20/16 0109   None     Chief Complaint  Patient presents with  . Pelvic Pain   Pelvic Pain  The patient's primary symptoms include pelvic pain. The patient's pertinent negatives include no genital itching, genital lesions, genital odor, vaginal bleeding or vaginal discharge. This is a chronic problem. The current episode started in the past 7 days. The problem occurs intermittently. The problem has been gradually worsening. Pertinent negatives include no back pain, constipation, diarrhea, dysuria, frequency, nausea, urgency or vomiting. Nothing aggravates the symptoms. She has tried nothing for the symptoms.    OB History    Gravida Para Term Preterm AB Living   2 1 1     1    SAB TAB Ectopic Multiple Live Births           1      History reviewed. No pertinent past medical history.  No past surgical history on file.  Family History  Problem Relation Age of Onset  . Heart murmur Mother   . Diabetes Father   . Stroke Father   . Heart disease Father   . Heart failure Father   . Diabetes Maternal Grandfather   . Heart disease Maternal Grandfather   . Hypertension Maternal Grandfather   . Heart attack Paternal Grandmother   . Arrhythmia Brother   . Healthy Brother     Social History  Substance Use Topics  . Smoking status: Never Smoker  . Smokeless tobacco: Never Used  . Alcohol use No    Allergies:  Allergies  Allergen Reactions  . Shellfish Allergy Swelling    Throat     Prescriptions Prior to Admission  Medication Sig Dispense Refill Last Dose  . guaiFENesin (MUCINEX) 600 MG 12  hr tablet Take 600 mg by mouth 2 (two) times daily.   Not Taking  . Prenatal MV-Min-FA-Omega-3 (PRENATAL GUMMIES/DHA & FA) 0.4-32.5 MG CHEW 1 chew by mouth daily   Taking  . Prenatal-DSS-FeCb-FeGl-FA (CITRANATAL BLOOM) 90-1 MG TABS Take 1 tablet by mouth daily. 30 tablet 12     Review of Systems  Gastrointestinal: Negative for constipation, diarrhea, nausea and vomiting.  Genitourinary: Positive for pelvic pain. Negative for dysuria, frequency, urgency and vaginal discharge.  Musculoskeletal: Negative for back pain.   Physical Exam   Blood pressure 103/60, pulse 93, temperature 98.3 F (36.8 C), temperature source Oral, resp. rate 16, height 5' 10.5" (1.791 m), weight 261 lb (118.4 kg), last menstrual period 03/26/2016, SpO2 98 %.  Physical Exam  Constitutional: She is oriented to person, place, and time. She appears well-developed and well-nourished.  HENT:  Head: Normocephalic.  Neck: Normal range of motion.  Respiratory: Effort normal.  GI: Soft. Bowel sounds are normal.  Genitourinary:  Genitourinary Comments: NEFG; cervix is closed , soft, posterior. No CMT or suprapubic tenderness.   Musculoskeletal: Normal range of motion.  Neurological: She is alert and oriented to person, place, and time. She has normal reflexes.  Skin: Skin is warm and dry.  Psychiatric: She has a normal mood and affect.    MAU Course  Procedures  MDM -NST: 145 bpm, moderate variability, no acels, no decels, one variable Reassured patient of normalcy of pelvic pressure, especially in later gestations. Patient happy to hear that she is not in labor; requesting note for work so that she can take intermittent rest breaks.  Assessment and Plan   1. Pelvic pressure in pregnancy, antepartum, third trimester    2. Patient stable for discharge; reviewed when to return to the MAU (bleeding, leaking of fluid, contractions).  3. Recommended that patient purchase pregnancy support belt.  4. PAtient plans to  keep prenatal visit on THursday. All questions answered.    Charlesetta GaribaldiKathryn Lorraine Jazilyn [name] CNM 10/20/2016, 2:10 AM

## 2016-10-23 ENCOUNTER — Encounter: Payer: Self-pay | Admitting: Certified Nurse Midwife

## 2016-10-23 ENCOUNTER — Ambulatory Visit (INDEPENDENT_AMBULATORY_CARE_PROVIDER_SITE_OTHER): Payer: Commercial Managed Care - HMO | Admitting: Certified Nurse Midwife

## 2016-10-23 VITALS — BP 122/74 | HR 99 | Wt 259.0 lb

## 2016-10-23 DIAGNOSIS — O26899 Other specified pregnancy related conditions, unspecified trimester: Secondary | ICD-10-CM

## 2016-10-23 DIAGNOSIS — O99013 Anemia complicating pregnancy, third trimester: Secondary | ICD-10-CM

## 2016-10-23 DIAGNOSIS — O26893 Other specified pregnancy related conditions, third trimester: Secondary | ICD-10-CM

## 2016-10-23 DIAGNOSIS — O9989 Other specified diseases and conditions complicating pregnancy, childbirth and the puerperium: Secondary | ICD-10-CM

## 2016-10-23 DIAGNOSIS — R102 Pelvic and perineal pain: Secondary | ICD-10-CM

## 2016-10-23 DIAGNOSIS — O99891 Other specified diseases and conditions complicating pregnancy: Secondary | ICD-10-CM

## 2016-10-23 DIAGNOSIS — M549 Dorsalgia, unspecified: Secondary | ICD-10-CM

## 2016-10-23 DIAGNOSIS — Z3480 Encounter for supervision of other normal pregnancy, unspecified trimester: Secondary | ICD-10-CM

## 2016-10-23 MED ORDER — COMFORT FIT MATERNITY SUPP LG MISC: 1.0000 [IU] | Freq: Every day | 0 refills | Status: DC

## 2016-10-23 NOTE — Progress Notes (Signed)
Patient is feeling more pressure- especially when she is up and walking. She may need a maternity belt.

## 2016-10-23 NOTE — Progress Notes (Signed)
   PRENATAL VISIT NOTE  Subjective:  Jamie [name] is a 26 y.o. G2P1001 at 5356w1d being seen today for ongoing prenatal care.  She is currently monitored for the following issues for this low-risk pregnancy and has Supervision of other normal pregnancy, antepartum and Anemia affecting pregnancy in third trimester on her problem list.  Patient reports backache, no bleeding, no contractions, no cramping, no leaking and pelvic pressure at work, denies any increased vaginal discharge, vaginal bleeding or contractions.  Is drinking water.  States that her supervisor at work is not compliant with her needing breaks.  Desires to decrease her work hours to 6/day, with breaks PRN and no prolonged stading.  Contractions: Not present. Vag. Bleeding: None.  Movement: Present. Denies leaking of fluid.   The following portions of the patient's history were reviewed and updated as appropriate: allergies, current medications, past family history, past medical history, past social history, past surgical history and problem list. Problem list updated.  Objective:   Vitals:   10/23/16 1324  BP: 122/74  Pulse: 99  Weight: 259 lb (117.5 kg)    Fetal Status: Fetal Heart Rate (bpm): 135 Fundal Height: 30 cm Movement: Present     General:  Alert, oriented and cooperative. Patient is in no acute distress.  Skin: Skin is warm and dry. No rash noted.   Cardiovascular: Normal heart rate noted  Respiratory: Normal respiratory effort, no problems with respiration noted  Abdomen: Soft, gravid, appropriate for gestational age. Pain/Pressure: Absent     Pelvic:  Cervical exam deferred        Extremities: Normal range of motion.  Edema: None  Mental Status: Normal mood and affect. Normal behavior. Normal judgment and thought content.   Assessment and Plan:  Pregnancy: G2P1001 at 2256w1d  1. Anemia affecting pregnancy in third trimester    Taking bloom  2. Supervision of other normal pregnancy, antepartum     Work  note written to decrease hours to no more than 6/day, PRN breaks, sitting as much as possible.   3. Back pain affecting pregnancy in third trimester      - Elastic Bandages & Supports (COMFORT FIT MATERNITY SUPP LG) MISC; 1 Units by Does not apply route daily.  Dispense: 1 each; Refill: 0  4. Pain of round ligament during pregnancy      - Elastic Bandages & Supports (COMFORT FIT MATERNITY SUPP LG) MISC; 1 Units by Does not apply route daily.  Dispense: 1 each; Refill: 0  Preterm labor symptoms and general obstetric precautions including but not limited to vaginal bleeding, contractions, leaking of fluid and fetal movement were reviewed in detail with the patient. Please refer to After Visit Summary for other counseling recommendations.  Return in about 2 weeks (around 11/06/2016) for ROB.   Jamie [name], CNM

## 2016-11-10 ENCOUNTER — Encounter: Payer: Self-pay | Admitting: Certified Nurse Midwife

## 2016-11-10 ENCOUNTER — Ambulatory Visit (INDEPENDENT_AMBULATORY_CARE_PROVIDER_SITE_OTHER): Payer: 59 | Admitting: Certified Nurse Midwife

## 2016-11-10 VITALS — BP 101/68 | HR 80 | Wt 261.0 lb

## 2016-11-10 DIAGNOSIS — Z3480 Encounter for supervision of other normal pregnancy, unspecified trimester: Secondary | ICD-10-CM

## 2016-11-10 DIAGNOSIS — Z3483 Encounter for supervision of other normal pregnancy, third trimester: Secondary | ICD-10-CM

## 2016-11-10 DIAGNOSIS — D649 Anemia, unspecified: Secondary | ICD-10-CM

## 2016-11-10 DIAGNOSIS — O99013 Anemia complicating pregnancy, third trimester: Secondary | ICD-10-CM

## 2016-11-10 NOTE — Progress Notes (Signed)
   PRENATAL VISIT NOTE  Subjective:  Jamie [name] is a 26 y.o. G2P1001 at 6075w5d being seen today for ongoing prenatal care.  She is currently monitored for the following issues for this low-risk pregnancy and has Supervision of other normal pregnancy, antepartum and Anemia affecting pregnancy in third trimester on her problem list.  Patient reports no complaints.  Contractions: Not present. Vag. Bleeding: None.  Movement: Present. Denies leaking of fluid.   The following portions of the patient's history were reviewed and updated as appropriate: allergies, current medications, past family history, past medical history, past social history, past surgical history and problem list. Problem list updated.  Objective:   Vitals:   11/10/16 1102  BP: 101/68  Pulse: 80  Weight: 261 lb (118.4 kg)    Fetal Status: Fetal Heart Rate (bpm): 137 Fundal Height: 33 cm Movement: Present     General:  Alert, oriented and cooperative. Patient is in no acute distress.  Skin: Skin is warm and dry. No rash noted.   Cardiovascular: Normal heart rate noted  Respiratory: Normal respiratory effort, no problems with respiration noted  Abdomen: Soft, gravid, appropriate for gestational age. Pain/Pressure: Present     Pelvic:  Cervical exam deferred        Extremities: Normal range of motion.     Mental Status: Normal mood and affect. Normal behavior. Normal judgment and thought content.   Assessment and Plan:  Pregnancy: G2P1001 at 7575w5d  1. Supervision of other normal pregnancy, antepartum     Doing well  2. Anemia affecting pregnancy in third trimester     Taking bloom.   Preterm labor symptoms and general obstetric precautions including but not limited to vaginal bleeding, contractions, leaking of fluid and fetal movement were reviewed in detail with the patient. Please refer to After Visit Summary for other counseling recommendations.  Return in about 2 weeks (around 11/24/2016) for ROB.   Roe Coombsachelle  A Rayquan Amrhein, CNM

## 2016-11-24 ENCOUNTER — Ambulatory Visit (INDEPENDENT_AMBULATORY_CARE_PROVIDER_SITE_OTHER): Payer: 59 | Admitting: Certified Nurse Midwife

## 2016-11-24 ENCOUNTER — Encounter: Payer: Self-pay | Admitting: Certified Nurse Midwife

## 2016-11-24 VITALS — BP 113/74 | HR 77 | Wt 262.0 lb

## 2016-11-24 DIAGNOSIS — Z3480 Encounter for supervision of other normal pregnancy, unspecified trimester: Secondary | ICD-10-CM

## 2016-11-24 DIAGNOSIS — Z3483 Encounter for supervision of other normal pregnancy, third trimester: Secondary | ICD-10-CM

## 2016-11-24 NOTE — Progress Notes (Signed)
   PRENATAL VISIT NOTE  Subjective:  Jamie [name] is a 26 y.o. G2P1001 at 4256w5d being seen today for ongoing prenatal care.  She is currently monitored for the following issues for this low-risk pregnancy and has Supervision of other normal pregnancy, antepartum and Anemia affecting pregnancy in third trimester on her problem list.  Patient reports no bleeding, no contractions, no cramping and no leaking.  Contractions: Not present. Vag. Bleeding: None.  Movement: Present. Denies leaking of fluid.   The following portions of the patient's history were reviewed and updated as appropriate: allergies, current medications, past family history, past medical history, past social history, past surgical history and problem list. Problem list updated.  Objective:   Vitals:   11/24/16 1135  BP: 113/74  Pulse: 77  Weight: 262 lb (118.8 kg)    Fetal Status: Fetal Heart Rate (bpm): 135 Fundal Height: 35 cm Movement: Present     General:  Alert, oriented and cooperative. Patient is in no acute distress.  Skin: Skin is warm and dry. No rash noted.   Cardiovascular: Normal heart rate noted  Respiratory: Normal respiratory effort, no problems with respiration noted  Abdomen: Soft, gravid, appropriate for gestational age.  Pain/Pressure: Present     Pelvic: Cervical exam deferred        Extremities: Normal range of motion.     Mental Status:  Normal mood and affect. Normal behavior. Normal judgment and thought content.   Assessment and Plan:  Pregnancy: G2P1001 at 5156w5d  1. Supervision of other normal pregnancy, antepartum      Doing well  Preterm labor symptoms and general obstetric precautions including but not limited to vaginal bleeding, contractions, leaking of fluid and fetal movement were reviewed in detail with the patient. Please refer to After Visit Summary for other counseling recommendations.  Return in about 2 weeks (around 12/08/2016) for ROB, GBS.   Roe Coombsachelle A Lillieann Pavlich, CNM

## 2016-12-08 ENCOUNTER — Ambulatory Visit (INDEPENDENT_AMBULATORY_CARE_PROVIDER_SITE_OTHER): Payer: 59 | Admitting: Certified Nurse Midwife

## 2016-12-08 ENCOUNTER — Other Ambulatory Visit (HOSPITAL_COMMUNITY)
Admission: RE | Admit: 2016-12-08 | Discharge: 2016-12-08 | Disposition: A | Discharge status: Home / Self Care | Payer: 59 | Source: Ambulatory Visit | Attending: Certified Nurse Midwife | Admitting: Certified Nurse Midwife

## 2016-12-08 ENCOUNTER — Encounter: Payer: Self-pay | Admitting: Certified Nurse Midwife

## 2016-12-08 VITALS — BP 127/80 | HR 95 | Wt 263.0 lb

## 2016-12-08 DIAGNOSIS — Z3483 Encounter for supervision of other normal pregnancy, third trimester: Secondary | ICD-10-CM | POA: Diagnosis present

## 2016-12-08 DIAGNOSIS — Z3A35 35 weeks gestation of pregnancy: Secondary | ICD-10-CM | POA: Insufficient documentation

## 2016-12-08 DIAGNOSIS — O99013 Anemia complicating pregnancy, third trimester: Secondary | ICD-10-CM

## 2016-12-08 DIAGNOSIS — Z3480 Encounter for supervision of other normal pregnancy, unspecified trimester: Secondary | ICD-10-CM

## 2016-12-08 NOTE — Progress Notes (Signed)
   PRENATAL VISIT NOTE  Subjective:  Janace ArisJete Mcilhenny is a 26 y.o. G2P1001 at 5353w5d being seen today for ongoing prenatal care.  She is currently monitored for the following issues for this low-risk pregnancy and has Supervision of other normal pregnancy, antepartum and Anemia affecting pregnancy in third trimester on her problem list.  Patient reports no complaints.  Contractions: Not present. Vag. Bleeding: None.  Movement: Present. Denies leaking of fluid.   The following portions of the patient's history were reviewed and updated as appropriate: allergies, current medications, past family history, past medical history, past social history, past surgical history and problem list. Problem list updated.  Objective:   Vitals:   12/08/16 1015  BP: 127/80  Pulse: 95  Weight: 263 lb (119.3 kg)    Fetal Status: Fetal Heart Rate (bpm): 139 Fundal Height: 36 cm Movement: Present  Presentation: Vertex  General:  Alert, oriented and cooperative. Patient is in no acute distress.  Skin: Skin is warm and dry. No rash noted.   Cardiovascular: Normal heart rate noted  Respiratory: Normal respiratory effort, no problems with respiration noted  Abdomen: Soft, gravid, appropriate for gestational age.  Pain/Pressure: Present     Pelvic: Cervical exam performed Dilation: Closed Effacement (%): 0 Station: -3  Extremities: Normal range of motion.  Edema: None, Outer os 1cm  Mental Status:  Normal mood and affect. Normal behavior. Normal judgment and thought content.   Assessment and Plan:  Pregnancy: G2P1001 at 3053w5d  1. Supervision of other normal pregnancy, antepartum      - Cervicovaginal ancillary only - Strep Gp B NAA  2. Anemia affecting pregnancy in third trimester     Taking Bloom  Preterm labor symptoms and general obstetric precautions including but not limited to vaginal bleeding, contractions, leaking of fluid and fetal movement were reviewed in detail with the patient. Please refer to  After Visit Summary for other counseling recommendations.  Return in about 1 week (around 12/15/2016) for ROB.   Roe Coombsachelle A Denney, CNM

## 2016-12-09 LAB — CERVICOVAGINAL ANCILLARY ONLY
Bacterial vaginitis: NEGATIVE
Candida vaginitis: NEGATIVE
Chlamydia: NEGATIVE
Neisseria Gonorrhea: NEGATIVE
TRICH (WINDOWPATH): NEGATIVE

## 2016-12-09 LAB — OB RESULTS CONSOLE GBS: STREP GROUP B AG: NEGATIVE

## 2016-12-10 LAB — STREP GP B NAA: STREP GROUP B AG: NEGATIVE

## 2016-12-15 ENCOUNTER — Ambulatory Visit: Payer: Self-pay | Admitting: Obstetrics

## 2016-12-15 ENCOUNTER — Inpatient Hospital Stay (HOSPITAL_COMMUNITY)
Admission: AD | Admit: 2016-12-15 | Discharge: 2016-12-16 | Disposition: A | Discharge status: Home / Self Care | Payer: 59 | Source: Ambulatory Visit | Attending: Obstetrics and Gynecology | Admitting: Obstetrics and Gynecology

## 2016-12-15 ENCOUNTER — Encounter (HOSPITAL_COMMUNITY): Payer: Self-pay

## 2016-12-15 DIAGNOSIS — R198 Other specified symptoms and signs involving the digestive system and abdomen: Secondary | ICD-10-CM

## 2016-12-15 DIAGNOSIS — Z91013 Allergy to seafood: Secondary | ICD-10-CM | POA: Insufficient documentation

## 2016-12-15 DIAGNOSIS — R109 Unspecified abdominal pain: Secondary | ICD-10-CM | POA: Insufficient documentation

## 2016-12-15 DIAGNOSIS — O26899 Other specified pregnancy related conditions, unspecified trimester: Secondary | ICD-10-CM | POA: Insufficient documentation

## 2016-12-15 DIAGNOSIS — O368190 Decreased fetal movements, unspecified trimester, not applicable or unspecified: Secondary | ICD-10-CM | POA: Insufficient documentation

## 2016-12-15 DIAGNOSIS — Z3A00 Weeks of gestation of pregnancy not specified: Secondary | ICD-10-CM | POA: Insufficient documentation

## 2016-12-15 DIAGNOSIS — Z3480 Encounter for supervision of other normal pregnancy, unspecified trimester: Secondary | ICD-10-CM

## 2016-12-15 NOTE — MAU Provider Note (Signed)
History     CSN: [id number]  Arrival date and time: 12/15/16 2247    Chief Complaint  Patient presents with  . Decreased Fetal Movement  . Umbilical discharge   Patient presenting with black umbilical discharge for one day. She says it is painful in the same area. It feels as if baby is stretching her abdomen out. She is also complaining of her belly button being itchy. She denies VB, LOF, HA, RUQ, change in vision. Patient also saying her baby seems to be moving less, but she can still feel him as he is "stretching her out". She came in because he is moving less and that was worrying her.  Abdominal Pain  This is a new problem. The current episode started today. The onset quality is undetermined (pain is in skin of umbilicus, not deeper). The problem occurs intermittently. The problem has been gradually improving. The pain is located in the periumbilical region. The pain is mild. Quality: "skin stretching", hurts when baby kicks. The abdominal pain does not radiate. Pertinent negatives include no constipation, diarrhea, fever, frequency, myalgias, nausea or vomiting. The pain is aggravated by palpation. The pain is relieved by nothing. She has tried nothing for the symptoms.      OB History    Gravida Para Term Preterm AB Living   2 1 1     1    SAB TAB Ectopic Multiple Live Births           1      History reviewed. No pertinent past medical history.  No past surgical history on file.  Family History  Problem Relation Age of Onset  . Heart murmur Mother   . Diabetes Father   . Stroke Father   . Heart disease Father   . Heart failure Father   . Diabetes Maternal Grandfather   . Heart disease Maternal Grandfather   . Hypertension Maternal Grandfather   . Heart attack Paternal Grandmother   . Arrhythmia Brother   . Healthy Brother     Social History  Substance Use Topics  . Smoking status: Never Smoker  . Smokeless tobacco: Never Used  . Alcohol use No     Allergies:  Allergies  Allergen Reactions  . Shellfish Allergy Swelling    Throat     Prescriptions Prior to Admission  Medication Sig Dispense Refill Last Dose  . Elastic Bandages & Supports (COMFORT FIT MATERNITY SUPP LG) MISC 1 Units by Does not apply route daily. (Patient not taking: Reported on 12/08/2016) 1 each 0 Not Taking  . guaiFENesin (MUCINEX) 600 MG 12 hr tablet Take 600 mg by mouth 2 (two) times daily.   Not Taking  . Prenatal MV-Min-FA-Omega-3 (PRENATAL GUMMIES/DHA & FA) 0.4-32.5 MG CHEW 1 chew by mouth daily   Taking  . Prenatal-DSS-FeCb-FeGl-FA (CITRANATAL BLOOM) 90-1 MG TABS Take 1 tablet by mouth daily. 30 tablet 12 Taking    Review of Systems  Constitutional: Negative for fever.  Gastrointestinal: Positive for abdominal pain. Negative for constipation, diarrhea, nausea and vomiting.  Genitourinary: Negative for frequency.  Musculoskeletal: Negative for myalgias.   Physical Exam   Blood pressure 120/67, pulse 82, temperature 97.9 F (36.6 C), resp. rate 19, last menstrual period 03/26/2016.  Physical Exam  Constitutional: She appears well-developed and well-nourished.  HENT:  Head: Normocephalic.  Neck: Neck supple.  Cardiovascular: Normal rate.   Respiratory: Effort normal.  GI: Soft.  Musculoskeletal: She exhibits no edema.  Neurological: She is alert.  Skin: Skin is warm  and dry. No erythema.     Psychiatric: She has a normal mood and affect. Her behavior is normal.    MAU Course  Procedures  MDM Fetal monitoring- reactive and fetus moving a significant amount FHT: 125bpm, mod var, +accels, no decels- Cat 1  Assessment and Plan  Patient discharged home with educational materials Instructed to use cocoa butter on her belly for the itch and stretch Fetal monitoring reassuring Instructed to keep her appointment at [facility] tomorrow Reassured patient  [place] Shirley 12/15/2016, 11:31 PM   I examined patient also Umbilicus cleaned with  swabs and peroxide. Crusted black discharge, presumably old blood removed No skin excoriations visible Discussed this may be from inadvertent scratching Will have her clean daily at home FHR reactive and baby is moving No significant contractions.   I confirm that I have verified the information documented in the resident's note and that I have also personally reperformed the physical exam and all medical decision making activities.  Aviva Signs, CNM

## 2016-12-15 NOTE — MAU Note (Signed)
Pt here with c/o decreased fetal movement for the last 2 days. Felt baby move about 20 min ago, but just not as much as usual.

## 2016-12-15 NOTE — Discharge Instructions (Signed)
Skin Conditions During Pregnancy Pregnancy affects many parts of your body. One part is your skin. Most skin problems that develop during pregnancy are not serious and are considered a normal part of pregnancy. They go away on their own after the baby is born. Other skin problems may need treatment. What type of skin problems can develop during pregnancy?  Stretch marks. Stretch marks are purple or pink lines on the skin. They may appear on the belly, breasts, thighs, or buttocks. Stretch marks are caused by weight gain that causes the skin to stretch. Stretch marks do not cause problems. Almost all women get them during pregnancy.  Darkening of the skin (hyperpigmentation). The darkening may occur in patches or as a line. Patches may appear on the face, nipples, or genital area. Lines often stretch from the belly button to the pubic area. Hyperpigmentation develops in almost all pregnant women. It is more severe in women with a dark complexion.  Spider angiomas. These are tiny pink or red lines that go out from a center point, like the legs of a spider. Usually, they are on the face, neck, and arms. They do not cause problems. They are most common in women with light complexions.  Palmar erythema. This is a reddening of the palms. It is most common in women with light complexions.  Swelling and redness. This can occur on the face, eyelids, fingers, or toes.  Pruritic urticarial papules and plaques of pregnancy (PUPPP). This is a rash that is itchy, red, and has tiny blisters. The cause is unknown. It usually starts on the abdomen and may affect the arms or legs. It does not affect the face. It usually begins later in pregnancy. About a third of all pregnant women develop this condition. There are no associated problems to the fetus with this rash. Sometimes, oral steroids are used to calm down the itch. The rash clears after the baby is born.  Prurigo of pregnancy. This is a disease in which red  patches and bumps appear on the arms and legs. The cause is unknown. The patches and bumps clear after the baby is born. About a third of pregnant women develop this disease.  Acne. Pimples may develop, including in women who have had clear skin for a long time.  Skin tags. These are small flaps of skin that stick out from the body. They may grow or become darker during pregnancy. They are usually harmless.  Moles. These are flat or slightly raised growths. They are usually round and pink or brown. They may grow or become darker during pregnancy.  Intrahepatic cholestasis of pregnancy. This is a rare condition that causes itchy skin. It may run in families. It increases the risk of complications for the fetus. This condition usually resolves after delivery. It can recur with subsequent pregnancies.  Impetigo herpetiformis. This is a form of a severe skin disease called pustular psoriasis. Usually, delivery is the only method of resolving the condition. Some pre-existing skin conditions, such as atopic dermatitis, may become worse during pregnancy. Follow these instructions at home: Different conditions may have different instructions. In general:  Follow all your health care provider's directions about medicines to treat skin problems while you are pregnant. Do not use any over-the-counter medicines (including medicated creams and lotions) until you have checked with your health care provider. Many medicines are not safe to use when you are pregnant.  Avoid time in the sun. This will help keep your skin from darkening. When you  must be outside, use sunscreen and wear a hat with a wide brim to protect your face. The sunscreen should have a SPF of at least 15. This may help limit dark spots that develop when the skin is exposed to the sun.  To avoid problems from stretched skin: ? Do not sit or stand for long periods of time. ? Exercise regularly. This helps keep your skin in good  condition.  Use a gentle soap. This helps prevent acne.  Do not get too hot or too sweaty. This makes some skin rashes worse.  Wear loose clothes made of a soft fabric. This prevents skin irritation.  For itching, add oatmeal or cornstarch to your bathwater.  Use a skin moisturizer. Ask your health care provider for suggestions.  This information is not intended to replace advice given to you by your health care provider. Make sure you discuss any questions you have with your health care provider. Document Released: 05/24/2010 Document Revised: 09/27/2015 Document Reviewed: 01/31/2013 Elsevier Interactive Patient Education  [employer].

## 2016-12-16 ENCOUNTER — Encounter: Payer: Self-pay | Admitting: Certified Nurse Midwife

## 2016-12-16 ENCOUNTER — Other Ambulatory Visit: Payer: Self-pay | Admitting: Certified Nurse Midwife

## 2016-12-16 ENCOUNTER — Ambulatory Visit (INDEPENDENT_AMBULATORY_CARE_PROVIDER_SITE_OTHER): Payer: 59 | Admitting: Certified Nurse Midwife

## 2016-12-16 VITALS — BP 110/74 | HR 70 | Wt 265.0 lb

## 2016-12-16 DIAGNOSIS — O368130 Decreased fetal movements, third trimester, not applicable or unspecified: Secondary | ICD-10-CM

## 2016-12-16 DIAGNOSIS — Z3483 Encounter for supervision of other normal pregnancy, third trimester: Secondary | ICD-10-CM

## 2016-12-16 DIAGNOSIS — R109 Unspecified abdominal pain: Secondary | ICD-10-CM | POA: Diagnosis not present

## 2016-12-16 DIAGNOSIS — O368190 Decreased fetal movements, unspecified trimester, not applicable or unspecified: Secondary | ICD-10-CM | POA: Diagnosis present

## 2016-12-16 DIAGNOSIS — Z3480 Encounter for supervision of other normal pregnancy, unspecified trimester: Secondary | ICD-10-CM

## 2016-12-16 DIAGNOSIS — O26899 Other specified pregnancy related conditions, unspecified trimester: Secondary | ICD-10-CM | POA: Diagnosis not present

## 2016-12-16 DIAGNOSIS — Z91013 Allergy to seafood: Secondary | ICD-10-CM | POA: Diagnosis not present

## 2016-12-16 DIAGNOSIS — Z3A00 Weeks of gestation of pregnancy not specified: Secondary | ICD-10-CM | POA: Diagnosis not present

## 2016-12-16 NOTE — Progress Notes (Signed)
   PRENATAL VISIT NOTE  Subjective:  Jamie [name] is a 26 y.o. G2P1001 at 3160w6d being seen today for ongoing prenatal care.  She is currently monitored for the following issues for this low-risk pregnancy and has Supervision of other normal pregnancy, antepartum and Anemia affecting pregnancy in third trimester on her problem list.  Patient reports no bleeding, no contractions, no cramping, no leaking and was seen at MAU 12/15/16 for umbilical pain, discussed normal abdominal streching in late 3rd trimester.  Contractions: Not present. Vag. Bleeding: None.  Movement: Present. Denies leaking of fluid.   The following portions of the patient's history were reviewed and updated as appropriate: allergies, current medications, past family history, past medical history, past social history, past surgical history and problem list. Problem list updated.  Objective:   Vitals:   12/16/16 1101  BP: 110/74  Pulse: 70  Weight: 265 lb (120.2 kg)    Fetal Status: Fetal Heart Rate (bpm): 136 Fundal Height: 38 cm Movement: Present     General:  Alert, oriented and cooperative. Patient is in no acute distress.  Skin: Skin is warm and dry. No rash noted.   Cardiovascular: Normal heart rate noted  Respiratory: Normal respiratory effort, no problems with respiration noted  Abdomen: Soft, gravid, appropriate for gestational age.  Pain/Pressure: Present     Pelvic: Cervical exam deferred        Extremities: Normal range of motion.     Mental Status:  Normal mood and affect. Normal behavior. Normal judgment and thought content.   Assessment and Plan:  Pregnancy: G2P1001 at 7260w6d  1. Supervision of other normal pregnancy, antepartum      Doing well.  Taking bloom for anemia.    Term labor symptoms and general obstetric precautions including but not limited to vaginal bleeding, contractions, leaking of fluid and fetal movement were reviewed in detail with the patient. Please refer to After Visit Summary  for other counseling recommendations.  Return in about 1 week (around 12/23/2016) for ROB.   Roe Coombsachelle A Samra Pesch, CNM

## 2016-12-22 ENCOUNTER — Encounter: Payer: Self-pay | Admitting: Certified Nurse Midwife

## 2016-12-22 ENCOUNTER — Ambulatory Visit (INDEPENDENT_AMBULATORY_CARE_PROVIDER_SITE_OTHER): Payer: 59 | Admitting: Certified Nurse Midwife

## 2016-12-22 DIAGNOSIS — Z3480 Encounter for supervision of other normal pregnancy, unspecified trimester: Secondary | ICD-10-CM

## 2016-12-22 NOTE — Progress Notes (Signed)
   PRENATAL VISIT NOTE  Subjective:  Jamie [name] is a 26 y.o. G2P1001 at [redacted]w[redacted]d being seen today for ongoing prenatal care.  She is currently monitored for the following issues for this low-risk pregnancy and has Supervision of other normal pregnancy, antepartum and Anemia affecting pregnancy in third trimester on her problem list.  Patient reports no bleeding, no leaking and occasional contractions.  Contractions: Not present. Vag. Bleeding: None.  Movement: Present. Denies leaking of fluid.   The following portions of the patient's history were reviewed and updated as appropriate: allergies, current medications, past family history, past medical history, past social history, past surgical history and problem list. Problem list updated.  Objective:   Vitals:   12/22/16 1115  BP: 108/75  Pulse: 90  Weight: 267 lb 3.2 oz (121.2 kg)    Fetal Status: Fetal Heart Rate (bpm): 125 Fundal Height: 39 cm Movement: Present  Presentation: Vertex  General:  Alert, oriented and cooperative. Patient is in no acute distress.  Skin: Skin is warm and dry. No rash noted.   Cardiovascular: Normal heart rate noted  Respiratory: Normal respiratory effort, no problems with respiration noted  Abdomen: Soft, gravid, appropriate for gestational age.  Pain/Pressure: Absent     Pelvic: Cervical exam performed Dilation: Closed Effacement (%): 0 Station: -3  Extremities: Normal range of motion.  Edema: None  Mental Status:  Normal mood and affect. Normal behavior. Normal judgment and thought content.   Assessment and Plan:  Pregnancy: G2P1001 at [redacted]w[redacted]d  1. Supervision of other normal pregnancy, antepartum      Doing well.  Ready for delivery.  Still working. Desires to stop working on Friday.  Letter written.  Term labor symptoms and general obstetric precautions including but not limited to vaginal bleeding, contractions, leaking of fluid and fetal movement were reviewed in detail with the patient. Please  refer to After Visit Summary for other counseling recommendations.  Return in about 1 week (around 12/29/2016) for ROB.   Roe Coombs, CNM

## 2016-12-22 NOTE — Progress Notes (Signed)
Patient reports good fetal movement, denies contractions. 

## 2016-12-30 ENCOUNTER — Encounter: Payer: Self-pay | Admitting: Certified Nurse Midwife

## 2016-12-30 ENCOUNTER — Ambulatory Visit (INDEPENDENT_AMBULATORY_CARE_PROVIDER_SITE_OTHER): Payer: 59 | Admitting: Certified Nurse Midwife

## 2016-12-30 VITALS — BP 124/76 | HR 90 | Wt 270.7 lb

## 2016-12-30 DIAGNOSIS — O99013 Anemia complicating pregnancy, third trimester: Secondary | ICD-10-CM

## 2016-12-30 DIAGNOSIS — Z3480 Encounter for supervision of other normal pregnancy, unspecified trimester: Secondary | ICD-10-CM

## 2016-12-30 NOTE — Progress Notes (Signed)
   PRENATAL VISIT NOTE  Subjective:  Jamie [name] is a 26 y.o. G2P1001 at [redacted]w[redacted]d being seen today for ongoing prenatal care.  She is currently monitored for the following issues for this low-risk pregnancy and has Supervision of other normal pregnancy, antepartum and Anemia affecting pregnancy in third trimester on her problem list.  Patient reports no bleeding, no leaking and occasional contractions.  Contractions: Not present. Vag. Bleeding: None.  Movement: Present. Denies leaking of fluid.   The following portions of the patient's history were reviewed and updated as appropriate: allergies, current medications, past family history, past medical history, past social history, past surgical history and problem list. Problem list updated.  Objective:   Vitals:   12/30/16 1115  BP: 124/76  Pulse: 90  Weight: 270 lb 11.2 oz (122.8 kg)    Fetal Status: Fetal Heart Rate (bpm): 139 Fundal Height: 36 cm Movement: Present  Presentation: Vertex  General:  Alert, oriented and cooperative. Patient is in no acute distress.  Skin: Skin is warm and dry. No rash noted.   Cardiovascular: Normal heart rate noted  Respiratory: Normal respiratory effort, no problems with respiration noted  Abdomen: Soft, gravid, appropriate for gestational age.  Pain/Pressure: Absent     Pelvic: Cervical exam deferred Dilation: 3 Effacement (%): 50 Station: -3; soft  Extremities: Normal range of motion.  Edema: Mild pitting, slight indentation  Mental Status:  Normal mood and affect. Normal behavior. Normal judgment and thought content.   Assessment and Plan:  Pregnancy: G2P1001 at [redacted]w[redacted]d  1. Supervision of other normal pregnancy, antepartum      Doing well.  IOL form completed.   2. Anemia affecting pregnancy in third trimester     Taking Bloom.   Term labor symptoms and general obstetric precautions including but not limited to vaginal bleeding, contractions, leaking of fluid and fetal movement were reviewed in  detail with the patient. Please refer to After Visit Summary for other counseling recommendations.  Return in about 1 week (around 01/06/2017) for ROB, NST, IOL.   Roe Coombs, CNM

## 2016-12-31 ENCOUNTER — Telehealth (HOSPITAL_COMMUNITY): Payer: Self-pay | Admitting: *Deleted

## 2016-12-31 ENCOUNTER — Inpatient Hospital Stay (HOSPITAL_COMMUNITY)
Admission: AD | Admit: 2016-12-31 | Discharge: 2016-12-31 | Disposition: A | Discharge status: Home / Self Care | Payer: 59 | Source: Ambulatory Visit | Attending: Family Medicine | Admitting: Family Medicine

## 2016-12-31 ENCOUNTER — Encounter (HOSPITAL_COMMUNITY): Payer: Self-pay

## 2016-12-31 ENCOUNTER — Encounter (HOSPITAL_COMMUNITY): Payer: Self-pay | Admitting: *Deleted

## 2016-12-31 DIAGNOSIS — Z91013 Allergy to seafood: Secondary | ICD-10-CM | POA: Insufficient documentation

## 2016-12-31 DIAGNOSIS — N898 Other specified noninflammatory disorders of vagina: Secondary | ICD-10-CM | POA: Diagnosis present

## 2016-12-31 DIAGNOSIS — O26893 Other specified pregnancy related conditions, third trimester: Secondary | ICD-10-CM | POA: Diagnosis present

## 2016-12-31 DIAGNOSIS — Z833 Family history of diabetes mellitus: Secondary | ICD-10-CM | POA: Diagnosis not present

## 2016-12-31 DIAGNOSIS — Z3A40 40 weeks gestation of pregnancy: Secondary | ICD-10-CM | POA: Insufficient documentation

## 2016-12-31 DIAGNOSIS — Z8249 Family history of ischemic heart disease and other diseases of the circulatory system: Secondary | ICD-10-CM | POA: Insufficient documentation

## 2016-12-31 DIAGNOSIS — Z823 Family history of stroke: Secondary | ICD-10-CM | POA: Diagnosis not present

## 2016-12-31 LAB — POCT FERN TEST: POCT Fern Test: NEGATIVE

## 2016-12-31 NOTE — Telephone Encounter (Signed)
Preadmission screen  

## 2016-12-31 NOTE — MAU Note (Signed)
Pt presents to MAU with PROM of membranes at 0030. Pt denies vaginal bleeding and discharge. Pt had contractions earlier today but denies contractions at this time.

## 2016-12-31 NOTE — MAU Provider Note (Signed)
Chief Complaint:  Rupture of Membranes   None     HPI: Jamie [name] is a 26 y.o. G2P1001 at 2519w0d who presents to MAU reporting PROM. Patient states she felt a gush of fluid at 0030. She denies any concerning vaginal bleeding or discharge. Has had some contractions as well but they are irregular. Good fetal movement.   Pregnancy Course:  [facility] for [facility]  Past Medical History: History reviewed. No pertinent past medical history.  Past obstetric history: OB History  Gravida Para Term Preterm AB Living  2 1 1     1   SAB TAB Ectopic Multiple Live Births          1    # Outcome Date GA Lbr Len/2nd Weight Sex Delivery Anes PTL Lv  2 Current           1 Term 05/22/13 4278w3d 09:35 / 00:18 7 lb 4.8 oz (3.311 kg) M Vag-Spont None  LIV      Past Surgical History: History reviewed. No pertinent surgical history.   Family History: Family History  Problem Relation Age of Onset  . Heart murmur Mother   . Diabetes Father   . Stroke Father   . Heart disease Father   . Heart failure Father   . Diabetes Maternal Grandfather   . Heart disease Maternal Grandfather   . Hypertension Maternal Grandfather   . Heart attack Paternal Grandmother   . Arrhythmia Brother   . Healthy Brother     Social History: Social History  Substance Use Topics  . Smoking status: Never Smoker  . Smokeless tobacco: Never Used  . Alcohol use No    Allergies:  Allergies  Allergen Reactions  . Shellfish Allergy Swelling    Throat     Meds:  Prescriptions Prior to Admission  Medication Sig Dispense Refill Last Dose  . Prenatal MV-Min-FA-Omega-3 (PRENATAL GUMMIES/DHA & FA) 0.4-32.5 MG CHEW 1 chew by mouth daily   12/30/2016 at Unknown time  . Prenatal-DSS-FeCb-FeGl-FA (CITRANATAL BLOOM) 90-1 MG TABS Take 1 tablet by mouth daily. 30 tablet 12 12/30/2016 at Unknown time  . Elastic Bandages & Supports (COMFORT FIT MATERNITY SUPP LG) MISC 1 Units by Does not apply route daily.  (Patient not taking: Reported on 12/08/2016) 1 each 0 Not Taking  . guaiFENesin (MUCINEX) 600 MG 12 hr tablet Take 600 mg by mouth 2 (two) times daily.   Not Taking    I have reviewed patient's Past Medical Hx, Surgical Hx, Family Hx, Social Hx, medications and allergies.   ROS:  All systems reviewed and are negative for acute change except as noted in the HPI.   Physical Exam  Patient Vitals for the past 24 hrs:  BP Temp Temp src Pulse Resp Height Weight  12/31/16 0116 - - - - - 5\' 9"  (1.753 m) 270 lb (122.5 kg)  12/31/16 0111 115/72 98.1 F (36.7 C) Oral 80 18 - -   Constitutional: Well-developed, well-nourished female in no acute distress.  Cardiovascular: normal rate Respiratory: normal effort GI: Abd soft, non-tender, gravid appropriate for gestational age.  MS: Extremities nontender, no edema, normal ROM Neurologic: Alert and oriented x 4.  Pelvic/Speculum exam: NEFG, physiologic discharge, no blood, cervix clean. No CMT. No pooling. Dilation: 3 Effacement (%): 60 Cervical Position: Middle, Posterior Station: -3 Presentation: Vertex Exam by:: Doroteo GlassmanPhelps, DO  Labs: Results for orders placed or performed during the hospital encounter of 12/31/16 (from the past 72 hour(s))  POCT fern  test     Status: None   Collection Time: 12/31/16  1:26 AM  Result Value Ref Range   POCT Fern Test Negative = intact amniotic membranes     Imaging:  No results found.  MAU Course: Crist Fat negative Speculum exam unremarkable False labor  I personally reviewed the patient's NST today, found to be REACTIVE. 125 bpm, mod var, +accels, no decels. CTX: >10 min.  MDM: Plan of care reviewed with patient, including labs and tests ordered and medical treatment.   Assessment: 1. Vaginal discharge during pregnancy in third trimester     Plan: Discharge home in stable condition.  Labor precautions and fetal kick counts Handout given Follow-up with OB provider   Caryl Ada, DO OB  [facility]ellow [facility] for [facility], [facility] 12/31/2016 1:30 AM

## 2016-12-31 NOTE — Discharge Instructions (Signed)
Labor Induction Labor induction is when steps are taken to cause a pregnant woman to begin the labor process. Most women go into labor on their own between 37 weeks and 42 weeks of the pregnancy. When this does not happen or when there is a medical need, methods may be used to induce labor. Labor induction causes a pregnant woman's uterus to contract. It also causes the cervix to soften (ripen), open (dilate), and thin out (efface). Usually, labor is not induced before 39 weeks of the pregnancy unless there is a problem with the baby or mother. Before inducing labor, your health care provider will consider a number of factors, including the following:  The medical condition of you and the baby.  How many weeks along you are.  The status of the baby's lung maturity.  The condition of the cervix.  The position of the baby. What are the reasons for labor induction? Labor may be induced for the following reasons:  The health of the baby or mother is at risk.  The pregnancy is overdue by 1 week or more.  The water breaks but labor does not start on its own.  The mother has a health condition or serious illness, such as high blood pressure, infection, placental abruption, or diabetes.  The amniotic fluid amounts are low around the baby.  The baby is distressed. Convenience or wanting the baby to be born on a certain date is not a reason for inducing labor. What methods are used for labor induction? Several methods of labor induction may be used, such as:  Prostaglandin medicine. This medicine causes the cervix to dilate and ripen. The medicine will also start contractions. It can be taken by mouth or by inserting a suppository into the vagina.  Inserting a thin tube (catheter) with a balloon on the end into the vagina to dilate the cervix. Once inserted, the balloon is expanded with water, which causes the cervix to open.  Stripping the membranes. Your health care provider separates  amniotic sac tissue from the cervix, causing the cervix to be stretched and causing the release of a hormone called progesterone. This may cause the uterus to contract. It is often done during an office visit. You will be sent home to wait for the contractions to begin. You will then come in for an induction.  Breaking the water. Your health care provider makes a hole in the amniotic sac using a small instrument. Once the amniotic sac breaks, contractions should begin. This may still take hours to see an effect.  Medicine to trigger or strengthen contractions. This medicine is given through an IV access tube inserted into a vein in your arm. All of the methods of induction, besides stripping the membranes, will be done in the hospital. Induction is done in the hospital so that you and the baby can be carefully monitored. How long does it take for labor to be induced? Some inductions can take up to 2-3 days. Depending on the cervix, it usually takes less time. It takes longer when you are induced early in the pregnancy or if this is your first pregnancy. If a mother is still pregnant and the induction has been going on for 2-3 days, either the mother will be sent home or a cesarean delivery will be needed. What are the risks associated with labor induction? Some of the risks of induction include:  Changes in fetal heart rate, such as too high, too low, or erratic.  Fetal distress.    Chance of infection for the mother and baby.  Increased chance of having a cesarean delivery.  Breaking off (abruption) of the placenta from the uterus (rare).  Uterine rupture (very rare). When induction is needed for medical reasons, the benefits of induction may outweigh the risks. What are some reasons for not inducing labor? Labor induction should not be done if:  It is shown that your baby does not tolerate labor.  You have had previous surgeries on your uterus, such as a myomectomy or the removal of  fibroids.  Your placenta lies very low in the uterus and blocks the opening of the cervix (placenta previa).  Your baby is not in a head-down position.  The umbilical cord drops down into the birth canal in front of the baby. This could cut off the baby's blood and oxygen supply.  You have had a previous cesarean delivery.  There are unusual circumstances, such as the baby being extremely premature. This information is not intended to replace advice given to you by your health care provider. Make sure you discuss any questions you have with your health care provider. Document Released: 09/10/2006 Document Revised: 09/27/2015 Document Reviewed: 11/18/2012 Elsevier Interactive Patient Education  2017 Elsevier Inc[employer]  

## 2017-01-02 ENCOUNTER — Other Ambulatory Visit: Payer: Self-pay | Admitting: Advanced Practice Midwife

## 2017-01-05 ENCOUNTER — Inpatient Hospital Stay (HOSPITAL_COMMUNITY)
Admission: AD | Admit: 2017-01-05 | Discharge: 2017-01-07 | DRG: 775 | Disposition: A | Discharge status: Home / Self Care | Payer: 59 | Source: Ambulatory Visit | Attending: Obstetrics & Gynecology | Admitting: Obstetrics & Gynecology

## 2017-01-05 ENCOUNTER — Encounter (HOSPITAL_COMMUNITY): Payer: Self-pay | Admitting: *Deleted

## 2017-01-05 DIAGNOSIS — O26893 Other specified pregnancy related conditions, third trimester: Secondary | ICD-10-CM | POA: Diagnosis present

## 2017-01-05 DIAGNOSIS — Z3A40 40 weeks gestation of pregnancy: Secondary | ICD-10-CM

## 2017-01-05 DIAGNOSIS — O9902 Anemia complicating childbirth: Secondary | ICD-10-CM | POA: Diagnosis present

## 2017-01-05 DIAGNOSIS — Z3A41 41 weeks gestation of pregnancy: Secondary | ICD-10-CM

## 2017-01-05 DIAGNOSIS — O480 Post-term pregnancy: Secondary | ICD-10-CM | POA: Diagnosis present

## 2017-01-05 DIAGNOSIS — D649 Anemia, unspecified: Secondary | ICD-10-CM | POA: Diagnosis present

## 2017-01-05 DIAGNOSIS — O322XX0 Maternal care for transverse and oblique lie, not applicable or unspecified: Principal | ICD-10-CM | POA: Diagnosis present

## 2017-01-05 LAB — CBC
HEMATOCRIT: 34.2 % — AB (ref 36.0–46.0)
Hemoglobin: 11.7 g/dL — ABNORMAL LOW (ref 12.0–15.0)
MCH: 30.8 pg (ref 26.0–34.0)
MCHC: 34.2 g/dL (ref 30.0–36.0)
MCV: 90 fL (ref 78.0–100.0)
Platelets: 209 10*3/uL (ref 150–400)
RBC: 3.8 MIL/uL — ABNORMAL LOW (ref 3.87–5.11)
RDW: 13.9 % (ref 11.5–15.5)
WBC: 6.6 10*3/uL (ref 4.0–10.5)

## 2017-01-05 LAB — TYPE AND SCREEN
ABO/RH(D): O POS
ANTIBODY SCREEN: NEGATIVE

## 2017-01-05 LAB — RAPID URINE DRUG SCREEN, HOSP PERFORMED
AMPHETAMINES: NOT DETECTED
BENZODIAZEPINES: NOT DETECTED
Barbiturates: NOT DETECTED
Cocaine: NOT DETECTED
OPIATES: NOT DETECTED
Tetrahydrocannabinol: NOT DETECTED

## 2017-01-05 MED ORDER — OXYTOCIN BOLUS FROM INFUSION: 500.0000 mL | Freq: Once | INTRAVENOUS | Status: DC

## 2017-01-05 MED ORDER — IBUPROFEN 600 MG PO TABS
600.0000 mg | ORAL_TABLET | Freq: Four times a day (QID) | ORAL | Status: DC
  Administered 2017-01-05 – 2017-01-07 (×7): 600 mg via ORAL
  Filled 2017-01-05 (×7): qty 1

## 2017-01-05 MED ORDER — SIMETHICONE 80 MG PO CHEW: 80.0000 mg | CHEWABLE_TABLET | ORAL | Status: DC | PRN

## 2017-01-05 MED ORDER — BENZOCAINE-MENTHOL 20-0.5 % EX AERO
1.0000 "application " | INHALATION_SPRAY | CUTANEOUS | Status: DC | PRN
  Administered 2017-01-05: 1 via TOPICAL
  Filled 2017-01-05: qty 56

## 2017-01-05 MED ORDER — OXYCODONE-ACETAMINOPHEN 5-325 MG PO TABS: 1.0000 | ORAL_TABLET | ORAL | Status: DC | PRN

## 2017-01-05 MED ORDER — LACTATED RINGERS IV SOLN
INTRAVENOUS | Status: DC
  Administered 2017-01-05: 08:00:00 via INTRAVENOUS

## 2017-01-05 MED ORDER — PRENATAL MULTIVITAMIN CH
1.0000 | ORAL_TABLET | Freq: Every day | ORAL | Status: DC
  Administered 2017-01-06: 1 via ORAL
  Filled 2017-01-05: qty 1

## 2017-01-05 MED ORDER — OXYTOCIN 40 UNITS IN LACTATED RINGERS INFUSION - SIMPLE MED: 2.5000 [IU]/h | INTRAVENOUS | Status: DC

## 2017-01-05 MED ORDER — ACETAMINOPHEN 325 MG PO TABS
650.0000 mg | ORAL_TABLET | ORAL | Status: DC | PRN
  Administered 2017-01-05 – 2017-01-06 (×3): 650 mg via ORAL
  Filled 2017-01-05 (×3): qty 2

## 2017-01-05 MED ORDER — SENNOSIDES-DOCUSATE SODIUM 8.6-50 MG PO TABS
2.0000 | ORAL_TABLET | ORAL | Status: DC
  Administered 2017-01-07: 2 via ORAL
  Filled 2017-01-05 (×2): qty 2

## 2017-01-05 MED ORDER — ONDANSETRON HCL 4 MG/2ML IJ SOLN
4.0000 mg | Freq: Four times a day (QID) | INTRAMUSCULAR | Status: DC | PRN
  Administered 2017-01-05: 4 mg via INTRAVENOUS
  Filled 2017-01-05: qty 2

## 2017-01-05 MED ORDER — FENTANYL CITRATE (PF) 100 MCG/2ML IJ SOLN: 50.0000 ug | INTRAMUSCULAR | Status: DC | PRN

## 2017-01-05 MED ORDER — ACETAMINOPHEN 325 MG PO TABS
650.0000 mg | ORAL_TABLET | ORAL | Status: DC | PRN
  Administered 2017-01-05: 650 mg via ORAL
  Filled 2017-01-05: qty 2

## 2017-01-05 MED ORDER — LACTATED RINGERS IV SOLN
500.0000 mL | INTRAVENOUS | Status: DC | PRN
Start: 2017-01-05 — End: 2017-01-05

## 2017-01-05 MED ORDER — FENTANYL CITRATE (PF) 100 MCG/2ML IJ SOLN: 100.0000 ug | INTRAMUSCULAR | Status: DC | PRN

## 2017-01-05 MED ORDER — LIDOCAINE HCL (PF) 1 % IJ SOLN
INTRAMUSCULAR | Status: AC
  Filled 2017-01-05: qty 30

## 2017-01-05 MED ORDER — ONDANSETRON HCL 4 MG PO TABS: 4.0000 mg | ORAL_TABLET | ORAL | Status: DC | PRN

## 2017-01-05 MED ORDER — WITCH HAZEL-GLYCERIN EX PADS: 1.0000 "application " | MEDICATED_PAD | CUTANEOUS | Status: DC | PRN

## 2017-01-05 MED ORDER — ACETAMINOPHEN 325 MG PO TABS: 650.0000 mg | ORAL_TABLET | ORAL | Status: DC | PRN

## 2017-01-05 MED ORDER — OXYTOCIN 40 UNITS IN LACTATED RINGERS INFUSION - SIMPLE MED
INTRAVENOUS | Status: AC
  Filled 2017-01-05: qty 1000

## 2017-01-05 MED ORDER — LIDOCAINE HCL (PF) 1 % IJ SOLN
30.0000 mL | INTRAMUSCULAR | Status: AC | PRN
  Administered 2017-01-05: 30 mL via SUBCUTANEOUS
  Filled 2017-01-05: qty 30

## 2017-01-05 MED ORDER — ZOLPIDEM TARTRATE 5 MG PO TABS: 5.0000 mg | ORAL_TABLET | Freq: Every evening | ORAL | Status: DC | PRN

## 2017-01-05 MED ORDER — OXYTOCIN 40 UNITS IN LACTATED RINGERS INFUSION - SIMPLE MED: 1.0000 m[IU]/min | INTRAVENOUS | Status: DC

## 2017-01-05 MED ORDER — COCONUT OIL OIL: 1.0000 "application " | TOPICAL_OIL | Status: DC | PRN

## 2017-01-05 MED ORDER — ONDANSETRON HCL 4 MG/2ML IJ SOLN: 4.0000 mg | Freq: Four times a day (QID) | INTRAMUSCULAR | Status: DC | PRN

## 2017-01-05 MED ORDER — DIPHENHYDRAMINE HCL 25 MG PO CAPS: 25.0000 mg | ORAL_CAPSULE | Freq: Four times a day (QID) | ORAL | Status: DC | PRN

## 2017-01-05 MED ORDER — TERBUTALINE SULFATE 1 MG/ML IJ SOLN
0.2500 mg | Freq: Once | INTRAMUSCULAR | Status: DC | PRN
  Filled 2017-01-05: qty 1

## 2017-01-05 MED ORDER — OXYCODONE-ACETAMINOPHEN 5-325 MG PO TABS: 2.0000 | ORAL_TABLET | ORAL | Status: DC | PRN

## 2017-01-05 MED ORDER — TETANUS-DIPHTH-ACELL PERTUSSIS 5-2.5-18.5 LF-MCG/0.5 IM SUSP
0.5000 mL | Freq: Once | INTRAMUSCULAR | Status: AC
  Administered 2017-01-06: 0.5 mL via INTRAMUSCULAR

## 2017-01-05 MED ORDER — LACTATED RINGERS IV SOLN: 500.0000 mL | INTRAVENOUS | Status: DC | PRN

## 2017-01-05 MED ORDER — DIBUCAINE 1 % RE OINT: 1.0000 "application " | TOPICAL_OINTMENT | RECTAL | Status: DC | PRN

## 2017-01-05 MED ORDER — SOD CITRATE-CITRIC ACID 500-334 MG/5ML PO SOLN: 30.0000 mL | ORAL | Status: DC | PRN

## 2017-01-05 MED ORDER — OXYTOCIN BOLUS FROM INFUSION
500.0000 mL | Freq: Once | INTRAVENOUS | Status: AC
  Administered 2017-01-05: 500 mL via INTRAVENOUS

## 2017-01-05 MED ORDER — ONDANSETRON HCL 4 MG/2ML IJ SOLN: 4.0000 mg | INTRAMUSCULAR | Status: DC | PRN

## 2017-01-05 MED ORDER — LIDOCAINE HCL (PF) 1 % IJ SOLN: 30.0000 mL | INTRAMUSCULAR | Status: DC | PRN

## 2017-01-05 NOTE — H&P (Signed)
LABOR ADMISSION HISTORY AND PHYSICAL  Jamie [name] is a 26 y.o. female G2P1001 with IUP at 6980w5d by LMP c/w 13 wk U/S presenting for SOL. She reports +FMs, No LOF, no VB, no blurry vision, no headaches, no peripheral edema, and no RUQ pain.  She plans on breast feeding. She requests depo provera for birth control.  Dating: By LMP c/w 13 wk U/S --->  Estimated Date of Delivery: 12/31/16  Sono:   @[redacted]w[redacted]d , CWD, normal anatomy, cephalic presentation, 671g, 58% EFW  Prenatal History/Complications: [facility] initiation of care at 9w Low-risk pregnancy Anemia during third trimester, taking Bloom  Past Medical History: History reviewed. No pertinent past medical history.  Past Surgical History: Past Surgical History:  Procedure Laterality Date  . NO PAST SURGERIES      Obstetrical History: OB History    Gravida Para Term Preterm AB Living   2 1 1     1    SAB TAB Ectopic Multiple Live Births           1      Social History: Social History   Social History  . Marital status: Single    Spouse name: N/A  . Number of children: N/A  . Years of education: N/A   Social History Main Topics  . Smoking status: Never Smoker  . Smokeless tobacco: Never Used  . Alcohol use No  . Drug use: No  . Sexual activity: Yes    Partners: Male    Birth control/ protection: None   Other Topics Concern  . None   Social History Narrative  . None    Family History: Family History  Problem Relation Age of Onset  . Heart murmur Mother   . Diabetes Father   . Stroke Father   . Heart disease Father   . Heart failure Father   . Diabetes Maternal Grandfather   . Heart disease Maternal Grandfather   . Hypertension Maternal Grandfather   . Heart attack Paternal Grandmother   . Arrhythmia Brother   . Healthy Brother     Allergies: Allergies  Allergen Reactions  . Shellfish Allergy Swelling    Throat     Prescriptions Prior to Admission  Medication Sig  Dispense Refill Last Dose  . Elastic Bandages & Supports (COMFORT FIT MATERNITY SUPP LG) MISC 1 Units by Does not apply route daily. (Patient not taking: Reported on 12/08/2016) 1 each 0 Not Taking  . guaiFENesin (MUCINEX) 600 MG 12 hr tablet Take 600 mg by mouth 2 (two) times daily.   Not Taking  . Prenatal MV-Min-FA-Omega-3 (PRENATAL GUMMIES/DHA & FA) 0.4-32.5 MG CHEW 1 chew by mouth daily   12/30/2016 at Unknown time  . Prenatal-DSS-FeCb-FeGl-FA (CITRANATAL BLOOM) 90-1 MG TABS Take 1 tablet by mouth daily. 30 tablet 12 12/30/2016 at Unknown time     Review of Systems  All systems reviewed and negative except as stated in HPI  Blood pressure 124/67, pulse 95, temperature 97.7 F (36.5 C), temperature source Oral, resp. rate 20, last menstrual period 03/26/2016. General appearance: alert, cooperative, appears stated age and no distress Lungs: normal work of breathing Extremities: Homans sign is negative, no sign of DVT Presentation: cephalic Fetal monitoringBaseline: 130 bpm, Variability: Good {> 6 bpm), Accelerations: Reactive and Decelerations: Absent Uterine activityFrequency: Every 3 minutes Dilation: 6.5 Effacement (%): 100 Station: -1 Exam by:: Fabian NovemberM. Bhambri CNM  Prenatal labs: ABO, Rh: O/Positive/-- (01/25 1434) Antibody: Negative (01/25 1434) Rubella: Immune (01/25 1434) RPR: Non  Reactive (06/07 1045)  HBsAg: Negative (01/25 1434)  HIV:  NR GBS: Negative (08/06 1131)  GTT: Fasting- 75, 1 hr- 99, 2 hr- 82  Prenatal Transfer Tool  Maternal Diabetes: No Genetic Screening: Normal Maternal Ultrasounds/Referrals: Normal Fetal Ultrasounds or other Referrals:  None Maternal Substance Abuse:  No Significant Maternal Medications:  None Significant Maternal Lab Results: None  No results found for this or any previous visit (from the past 24 hour(s)).  Patient Active Problem List   Diagnosis Date Noted  . Anemia affecting pregnancy in third trimester 10/10/2016  . Supervision of  other normal pregnancy, antepartum 05/29/2016    Assessment: Jamie [name] is a 26 y.o. G2P1001 at [redacted]w[redacted]d here for SOL with ctx starting at 0500 on 01/05/17.  #Labor: spontaneous, active, will plan AROM, will likely not need further augmentation #Pain: Epidural upon request #FWB: Cat I #ID: GBS neg #MOF: breast #MOC:depo provera #Circ:  Yes, outpatient  Lezlie Octave, MD Family Medicine Resident PGY-1  01/05/2017, 7:54 AM   OB FELLOW HISTORY AND PHYSICAL ATTESTATION  I confirm that I have verified the information documented in the resident's note and that I have also personally reperformed the physical exam and all medical decision making activities. I agree with above documentation and have made edits as needed.   Caryl Ada OB Fellow 01/05/2017, 9:34 AM

## 2017-01-05 NOTE — Anesthesia Pain Management Evaluation Note (Signed)
  CRNA Pain Management Visit Note  Patient: Jamie [name], 26 y.o., female  "Hello I am a [facility]. We have an anesthesia team available at all times to provide care throughout the hospital, including epidural management and anesthesia for C-section. I don't know your plan for the delivery whether it a natural birth, water birth, IV sedation, nitrous supplementation, doula or epidural, but we want to meet your pain goals."   1.Was your pain managed to your expectations on prior hospitalizations?   Yes   2.What is your expectation for pain management during this hospitalization?     Labor support without medications  3.How can we help you reach that goal? natural  Record the patient's initial score and the patient's pain goal.   Pain: 8  Pain Goal: 10 [facility] wants you to be able to say your pain was always managed very well.  Jamie [name] 01/05/2017

## 2017-01-05 NOTE — Lactation Note (Signed)
This note was copied from a baby's chart. Lactation Consultation Note  Patient Name: Jamie [name]: 01/05/2017 Reason for consult: Initial assessment   P2, Baby 9 hours old.  Mother's nipples evert and compressible.  Bf first child for 3 months. Mother hand expressed a few drops before latching. Baby latched in cradle hold.  Intermittent sucks and swallows observed. Encouraged breast compression to keep baby active and not to hold tissue back from nose. Discussed basics. Mom encouraged to feed baby 8-12 times/24 hours and with feeding cues.  Mom made aware of O/P services, breastfeeding support groups, community resources, and our phone # for post-discharge questions.     Maternal Data Has patient been taught Hand Expression?: Yes Does the patient have breastfeeding experience prior to this delivery?: Yes  Feeding Feeding Type: Breast Fed Length of feed: 20 min  LATCH Score Latch: Grasps breast easily, tongue down, lips flanged, rhythmical sucking.  Audible Swallowing: Spontaneous and intermittent  Type of Nipple: Everted at rest and after stimulation  Comfort (Breast/Nipple): Soft / non-tender  Hold (Positioning): Assistance needed to correctly position infant at breast and maintain latch.  LATCH Score: 9  Interventions    Lactation Tools Discussed/Used     Consult Status Consult Status: Follow-up [name]: 01/06/17 Follow-up type: In-patient    Dahlia ByesBerkelhammer, Curties Conigliaro [facility] 01/05/2017, 10:31 PM

## 2017-01-05 NOTE — MAU Note (Signed)
Pt states contractions started @ 0500, she got up @ 0614 went to BR, passed clot.  Denies LOF.  Blood noted on pt's perineum & thighs on arrival to MAU.

## 2017-01-06 DIAGNOSIS — Z3483 Encounter for supervision of other normal pregnancy, third trimester: Secondary | ICD-10-CM

## 2017-01-06 LAB — CBC
HCT: 31.6 % — ABNORMAL LOW (ref 36.0–46.0)
HEMOGLOBIN: 11 g/dL — AB (ref 12.0–15.0)
MCH: 31.5 pg (ref 26.0–34.0)
MCHC: 34.8 g/dL (ref 30.0–36.0)
MCV: 90.5 fL (ref 78.0–100.0)
Platelets: 206 10*3/uL (ref 150–400)
RBC: 3.49 MIL/uL — AB (ref 3.87–5.11)
RDW: 14 % (ref 11.5–15.5)
WBC: 10.6 10*3/uL — ABNORMAL HIGH (ref 4.0–10.5)

## 2017-01-06 LAB — RPR: RPR: NONREACTIVE

## 2017-01-06 MED ORDER — COMPLETENATE 29-1 MG PO CHEW
1.0000 | CHEWABLE_TABLET | Freq: Every day | ORAL | Status: DC
  Filled 2017-01-06 (×2): qty 1

## 2017-01-06 NOTE — Plan of Care (Signed)
Problem: Physical Regulation: Goal: Ability to maintain clinical measurements within normal limits will improve Outcome: Progressing Spoke with pt about her taking iron, contacted Dr Doroteo GlassmanPhelps to inquire about adding iron supplement. No new orders at this time, but will continue to evaluate.

## 2017-01-06 NOTE — Progress Notes (Signed)
Post Partum Day 1  Subjective:  Jamie [name] is a 26 y.o. R1V4008G2P2002 9045w5d s/p SVD.  No acute events overnight.  Pt denies problems with ambulating, voiding or po intake.  She denies nausea or vomiting.  Pain is well controlled.  She has had flatus. She has not had bowel movement.  Lochia Small.  Plan for birth control is Depo-Provera.  Method of Feeding: Breast  Objective: BP (!) 106/54 (BP Location: Right Arm)   Pulse 80   Temp 98.5 F (36.9 C) (Oral)   Resp 18   Ht 5\' 9"  (1.753 m)   Wt 270 lb (122.5 kg)   LMP 03/26/2016   Breastfeeding? Unknown   BMI 39.87 kg/m   Physical Exam:  General: alert, cooperative and no distress Lochia:normal flow Chest: normal work of breathing Heart: RRR Abdomen: +BS, soft, nontender Uterine Fundus: firm DVT Evaluation: No evidence of DVT seen on physical exam. Extremities: no edema   Recent Labs  01/05/17 0759  HGB 11.7*  HCT 34.2*    Assessment/Plan:  ASSESSMENT: Jamie [name] is a 26 y.o. G2P2002 8845w5d ppd #1 s/p NSVD doing well.   Plan for discharge tomorrow and Lactation consult   LOS: 1 day   Caryl AdaJazma Jett Fukuda, DO OB Fellow Faculty Practice, [facility] 01/06/2017, 6:03 AM

## 2017-01-07 ENCOUNTER — Inpatient Hospital Stay (HOSPITAL_COMMUNITY): Admission: RE | Admit: 2017-01-07 | Payer: 59 | Source: Ambulatory Visit

## 2017-01-07 MED ORDER — IBUPROFEN 600 MG PO TABS: 600.0000 mg | ORAL_TABLET | Freq: Four times a day (QID) | ORAL | 0 refills | Status: DC

## 2017-01-07 NOTE — Discharge Instructions (Signed)

## 2017-01-07 NOTE — Discharge Summary (Signed)
OB Discharge Summary     Patient Name: Jamie [name] DOB: [birth year] MRN: [id number]  Date of admission: 01/05/2017 Delivering MD: Jamie LargePHELPS, Jamie Y   Date of discharge: 01/07/2017  Admitting diagnosis: 40 WKS BLEEDING CTX Intrauterine pregnancy: 10275w5d     Secondary diagnosis:  Active Problems:   SVD (spontaneous vaginal delivery)  Additional problems: none      Discharge diagnosis: Term Pregnancy Delivered                                                                                                Post partum procedures:none  Augmentation: none  Complications: None  Hospital course:  Onset of Labor With Vaginal Delivery     26 y.o. yo [id number] at 5175w5d was admitted in Active Labor on 01/05/2017. Patient had an uncomplicated labor course as follows:  Membrane Rupture Time/Date: 9:40 AM ,01/05/2017   Intrapartum Procedures: Episiotomy: None [1]                                         Lacerations:  1st degree [2]  Patient had a delivery of a Viable infant. 01/05/2017  Information for the patient's newborn:  Jamie [name] [[id number]][[id number]]  Delivery Method: Vaginal, Spontaneous Delivery (Filed from Delivery Summary)    Pateint had an uncomplicated postpartum course.  She is ambulating, tolerating a regular diet, passing flatus, and urinating well. Patient is discharged home in stable condition on 01/07/17.   Physical exam  Vitals:   01/05/17 1840 01/06/17 0230 01/06/17 1741 01/06/17 2151  BP: (!) 114/58 (!) 106/54 (!) 103/53 114/68  Pulse: 91 80 77 88  Resp: 18 18 18 16   Temp: 98.2 F (36.8 C) 98.5 F (36.9 C) 98.3 F (36.8 C) 98.5 F (36.9 C)  TempSrc: Oral Oral Oral Oral  SpO2:    100%  Weight:      Height:       General: alert, cooperative and no distress Lochia: appropriate Uterine Fundus: firm Incision: N/A DVT Evaluation: No evidence of DVT seen on physical exam. Labs: Lab Results  Component Value Date   WBC 10.6 (H) 01/06/2017   HGB 11.0 (L) 01/06/2017   HCT  31.6 (L) 01/06/2017   MCV 90.5 01/06/2017   PLT 206 01/06/2017   CMP Latest Ref Rng & Units 12/12/2014  Glucose 65 - 99 mg/dL 84  BUN 7 - 25 mg/dL 11  Creatinine 6.960.50 - 2.951.10 mg/dL 2.840.73  Sodium 132135 - 440146 mmol/L 138  Potassium 3.5 - 5.3 mmol/L 4.1  Chloride 98 - 110 mmol/L 103  CO2 20 - 31 mmol/L 27  Calcium 8.6 - 10.2 mg/dL 8.8  Total Protein 6.1 - 8.1 g/dL 7.5  Total Bilirubin 0.2 - 1.2 mg/dL 0.4  Alkaline Phos 33 - 115 U/L 63  AST 10 - 30 U/L 16  ALT 6 - 29 U/L 10    Discharge instruction: per After Visit Summary and "Baby and Me Booklet".  After visit meds:  Allergies as of 01/07/2017  Reactions   Shellfish Allergy Swelling   Throat      Medication List    TAKE these medications   albuterol 108 (90 Base) MCG/ACT inhaler Commonly known as:  PROVENTIL HFA;VENTOLIN HFA Inhale 1-2 puffs into the lungs every 6 (six) hours as needed for wheezing or shortness of breath.   CITRANATAL BLOOM 90-1 MG Tabs Take 1 tablet by mouth daily.   COMFORT FIT MATERNITY SUPP LG Misc 1 Units by Does not apply route daily.   ibuprofen 600 MG tablet Commonly known as:  ADVIL,MOTRIN Take 1 tablet (600 mg total) by mouth every 6 (six) hours.   PRENATAL GUMMIES/DHA & FA 0.4-32.5 MG Chew 1 chew by mouth daily            Discharge Care Instructions        Start     Ordered   01/07/17 0000  ibuprofen (ADVIL,MOTRIN) 600 MG tablet  Every 6 hours    Question:  Supervising Provider  Answer:  Levie Heritage   01/07/17 0610   01/07/17 0000  Discharge patient    Question Answer Comment  Discharge disposition 01-Home or Self Care   Discharge patient date 01/07/2017      01/07/17 0610   01/05/17 0000  OB RESULT CONSOLE Group B Strep    Comments:  This external order was created through the Results Console.   01/05/17 0816      Diet: routine diet  Activity: Advance as tolerated. Pelvic rest for 6 weeks.   Outpatient follow up:4 wks Follow up Appt:Future Appointments Date Time  Provider Department Center  02/03/2017 11:00 AM Orvilla Cornwall A, CNM [facility] None   Follow up Visit:No Follow-up on file.  Postpartum contraception: Depo Provera  Newborn Data: Live born female  Birth Weight: 8 lb 13.8 oz (4020 g) APGAR: 8, 9  Baby Feeding: Breast Disposition:home with mother   01/07/2017 Wynelle Bourgeois, CNM

## 2017-01-07 NOTE — Lactation Note (Signed)
This note was copied from a baby's chart. Lactation Consultation Note  Patient Name: Jamie [name] GMWNU'UToday's Date: 01/07/2017 Reason for consult: Follow-up assessment Baby at 44 hr of life and dyad set for d/c today. Upon entry baby was at the breast. Mom stated he had been on for about 20 minutes. He was falling asleep and came off 2-3 minutes into the visit. Mom reports baby is latching well. Her nipples are sore but feel better today. She has been working on getting a deeper latch and takes baby off when he falls asleep. She has a DEBP and manual pump at home. She voiced no concerns. Discussed baby behavior, feeding frequency, pumping, milk handling, artifical nipples, baby belly size, voids, wt loss, breast changes, and nipple care. Parents are aware of lactation services and support group. They will call as needed.    Maternal Data    Feeding Feeding Type: Breast Fed Length of feed: 20 min  LATCH Score Latch: Grasps breast easily, tongue down, lips flanged, rhythmical sucking. (per mom)  Audible Swallowing: A few with stimulation (baby was falling asleep when lactation came in)  Type of Nipple: Everted at rest and after stimulation  Comfort (Breast/Nipple): Filling, red/small blisters or bruises, mild/mod discomfort  Hold (Positioning): No assistance needed to correctly position infant at breast. (per mom)  LATCH Score: 8  Interventions Interventions: Breast feeding basics reviewed;Assisted with latch;Skin to skin;Breast massage;Hand express;Support pillows;Expressed milk;Coconut oil;Hand pump;DEBP;Ice  Lactation Tools Discussed/Used     Consult Status Consult Status: Complete Follow-up type: Call as needed    Jamie [name] 01/07/2017, 9:33 AM

## 2017-01-09 ENCOUNTER — Encounter (HOSPITAL_COMMUNITY): Payer: Self-pay | Admitting: *Deleted

## 2017-01-09 ENCOUNTER — Ambulatory Visit (INDEPENDENT_AMBULATORY_CARE_PROVIDER_SITE_OTHER): Payer: 59 | Admitting: Certified Nurse Midwife

## 2017-01-09 ENCOUNTER — Encounter: Payer: Self-pay | Admitting: Certified Nurse Midwife

## 2017-01-09 ENCOUNTER — Inpatient Hospital Stay (HOSPITAL_COMMUNITY)
Admission: AD | Admit: 2017-01-09 | Discharge: 2017-01-09 | Disposition: A | Discharge status: Home / Self Care | Payer: 59 | Source: Ambulatory Visit | Attending: Obstetrics and Gynecology | Admitting: Obstetrics and Gynecology

## 2017-01-09 LAB — CBC
HCT: 32.7 % — ABNORMAL LOW (ref 36.0–46.0)
Hemoglobin: 11.2 g/dL — ABNORMAL LOW (ref 12.0–15.0)
MCH: 31.1 pg (ref 26.0–34.0)
MCHC: 34.3 g/dL (ref 30.0–36.0)
MCV: 90.8 fL (ref 78.0–100.0)
Platelets: 239 10*3/uL (ref 150–400)
RBC: 3.6 MIL/uL — AB (ref 3.87–5.11)
RDW: 13.5 % (ref 11.5–15.5)
WBC: 7.4 10*3/uL (ref 4.0–10.5)

## 2017-01-09 NOTE — Progress Notes (Signed)
Patient reports that she is having golf size blood clots when she has a bowel movement.

## 2017-01-09 NOTE — MAU Note (Signed)
SVD 9/3 and told had first degree tear. Since delivery when has BM passes fist-size clot. Seen at office today and told had 3 degree tear. Told if passed another clot to come to MAU. Since going home Weds feels lightheaded and weaker on L side. Feels a lot of pressure lower abd when walks. Used 2-3 pads today. Not much on pad except when passes the clot

## 2017-01-09 NOTE — Progress Notes (Signed)
Patient ID: Jamie [name], female   DOB: 05/09/1990, 26 y.o.   MRN: [id number]  Chief Complaint  Patient presents with  . Postpartum Follow-up    HPI Jamie [name] is a 26 y.o. female.  Patient is here with complaints of passing large clots vaginally when straining to stool.  States that is started yesterday.  States that before the clots she does have cramping.  Is breast feeding and denies any problems with the breast feeding. Delivered 01/05/17, left [facility] on 01/07/17.  In delivery note reported as a 1st degree vaginal laceration.  No problems in her notes postpartum.     HPI  No past medical history on file.  Past Surgical History:  Procedure Laterality Date  . NO PAST SURGERIES      Family History  Problem Relation Age of Onset  . Heart murmur Mother   . Diabetes Father   . Stroke Father   . Heart disease Father   . Heart failure Father   . Diabetes Maternal Grandfather   . Heart disease Maternal Grandfather   . Hypertension Maternal Grandfather   . Heart attack Paternal Grandmother   . Arrhythmia Brother   . Healthy Brother     Social History Social History  Substance Use Topics  . Smoking status: Never Smoker  . Smokeless tobacco: Never Used  . Alcohol use No    Allergies  Allergen Reactions  . Shellfish Allergy Swelling    Throat     Current Outpatient Prescriptions  Medication Sig Dispense Refill  . ibuprofen (ADVIL,MOTRIN) 600 MG tablet Take 1 tablet (600 mg total) by mouth every 6 (six) hours. 30 tablet 0  . albuterol (PROVENTIL HFA;VENTOLIN HFA) 108 (90 Base) MCG/ACT inhaler Inhale 1-2 puffs into the lungs every 6 (six) hours as needed for wheezing or shortness of breath.    . Elastic Bandages & Supports (COMFORT FIT MATERNITY SUPP LG) MISC 1 Units by Does not apply route daily. (Patient not taking: Reported on 12/08/2016) 1 each 0  . Prenatal MV-Min-FA-Omega-3 (PRENATAL GUMMIES/DHA & FA) 0.4-32.5 MG CHEW 1 chew by mouth daily    . Prenatal-DSS-FeCb-FeGl-FA  (CITRANATAL BLOOM) 90-1 MG TABS Take 1 tablet by mouth daily. (Patient not taking: Reported on 01/09/2017) 30 tablet 12   No current facility-administered medications for this visit.     Review of Systems Review of Systems Constitutional: negative for fatigue and weight loss Respiratory: negative for cough and wheezing Cardiovascular: negative for chest pain, fatigue and palpitations Gastrointestinal: negative for abdominal pain and change in bowel habits Genitourinary: + vaginal bleeding postpartum, vaginal lac from delivery Integument/breast: negative for nipple discharge Musculoskeletal:negative for myalgias Neurological: negative for gait problems and tremors Behavioral/Psych: negative for abusive relationship, depression Endocrine: negative for temperature intolerance      Blood pressure 120/87, pulse 97, height 5\' 9"  (1.753 m), weight 262 lb 1.6 oz (118.9 kg), last menstrual period 03/26/2016, currently breastfeeding.  Physical Exam Physical Exam General:   alert  Skin:   no rash or abnormalities  Lungs:   clear to auscultation bilaterally  Heart:   regular rate and rhythm, S1, S2 normal, no murmur, click, rub or gallop  Breasts:   deferred  Abdomen:  normal findings: no organomegaly, soft, non-tender and no hernia Fundus: 18cm, -2 below U.  Pelvis:  External genitalia: normal general appearance, lac present with sutures along posterior introitus extending towards anal sphinctor Urinary system: urethral meatus normal and bladder without fullness, nontender Vaginal: normal without tenderness, induration or masses, sutures  present in sulcus Cervix: no CMT, OS 1 cm dilated, no clots obtained with manual massage of fundus.  Uterus: midline and non-tender, postpartum size Rectal: intact, stool present in vault, septum intact    50% of 15 min visit spent on counseling and coordination of care.    Data Reviewed Previous medical hx, labs  Assessment     Postpartum bleeding 2nd  degree lac by inspection Intact rectal tissue, no hemorrhoids    Plan Educated to go to MAU for increased bleeding/hemorrhage, fever, abdominal tenderness, large clots.   OTC colace.  Follow up with normal routine exam.

## 2017-01-09 NOTE — Discharge Instructions (Signed)

## 2017-01-09 NOTE — MAU Provider Note (Signed)
History     CSN: [id number]  Arrival date and time: 01/09/17 4098   First Provider Initiated Contact with Patient 01/09/17 2024      Chief Complaint  Patient presents with  . Vaginal Bleeding   Jamie [name] is a 26 y.o. G2P2002 who is SP NSVD on . She states that her bleeding has been decreasing, and it is like a period that is ending. However, she has been passing clots when she has a BM. She states that this happened once prior to DC home, and then again today. She was seen in the office today. She did not have any stitches.    Vaginal Bleeding  This is a new problem. The current episode started in the past 7 days. The problem occurs intermittently. The problem has been unchanged. The patient is experiencing no pain. Pertinent negatives include no chills, fever, nausea or vomiting. The symptoms are aggravated by bowel movements. She has tried nothing for the symptoms.     No past medical history on file.  Past Surgical History:  Procedure Laterality Date  . NO PAST SURGERIES      Family History  Problem Relation Age of Onset  . Heart murmur Mother   . Diabetes Father   . Stroke Father   . Heart disease Father   . Heart failure Father   . Diabetes Maternal Grandfather   . Heart disease Maternal Grandfather   . Hypertension Maternal Grandfather   . Heart attack Paternal Grandmother   . Arrhythmia Brother   . Healthy Brother     Social History  Substance Use Topics  . Smoking status: Never Smoker  . Smokeless tobacco: Never Used  . Alcohol use No    Allergies:  Allergies  Allergen Reactions  . Shellfish Allergy Swelling    Throat     Prescriptions Prior to Admission  Medication Sig Dispense Refill Last Dose  . albuterol (PROVENTIL HFA;VENTOLIN HFA) 108 (90 Base) MCG/ACT inhaler Inhale 1-2 puffs into the lungs every 6 (six) hours as needed for wheezing or shortness of breath.   Not Taking  . Elastic Bandages & Supports (COMFORT FIT MATERNITY SUPP LG) MISC 1  Units by Does not apply route daily. (Patient not taking: Reported on 12/08/2016) 1 each 0 Not Taking  . ibuprofen (ADVIL,MOTRIN) 600 MG tablet Take 1 tablet (600 mg total) by mouth every 6 (six) hours. 30 tablet 0 Taking  . Prenatal MV-Min-FA-Omega-3 (PRENATAL GUMMIES/DHA & FA) 0.4-32.5 MG CHEW 1 chew by mouth daily   Not Taking  . Prenatal-DSS-FeCb-FeGl-FA (CITRANATAL BLOOM) 90-1 MG TABS Take 1 tablet by mouth daily. (Patient not taking: Reported on 01/09/2017) 30 tablet 12 Not Taking    Review of Systems  Constitutional: Negative for chills and fever.  Gastrointestinal: Negative for nausea and vomiting.  Genitourinary: Positive for vaginal bleeding.   Physical Exam   Blood pressure 126/67, pulse 89, temperature 98.2 F (36.8 C), resp. rate 18, height  (1.753 m), weight 259 lb (117.5 kg), last menstrual period 03/26/2016, SpO2 100 %, currently breastfeeding.  Physical Exam  Nursing note and vitals reviewed. Constitutional: She is oriented to person, place, and time. She appears well-developed and well-nourished. No distress.  HENT:  Head: Normocephalic.  Cardiovascular: Normal rate.   Respiratory: Effort normal.  GI: Soft. There is no tenderness. There is no rebound.  Genitourinary:  Genitourinary Comments:  Fundus: firm Bleeding is minimal. No clots noted on exam  Neurological: She is alert and oriented to person, place, and  time.  Skin: Skin is warm and dry.  Psychiatric: She has a normal mood and affect.   Results for orders placed or performed during the hospital encounter of 01/09/17 (from the past 24 hour(s))  CBC     Status: Abnormal   Collection Time: 01/09/17  8:26 PM  Result Value Ref Range   WBC 7.4 4.0 - 10.5 K/uL   RBC 3.60 (L) 3.87 - 5.11 MIL/uL   Hemoglobin 11.2 (L) 12.0 - 15.0 g/dL   HCT 29.532.7 (L) 62.136.0 - 30.846.0 %   MCV 90.8 78.0 - 100.0 fL   MCH 31.1 26.0 - 34.0 pg   MCHC 34.3 30.0 - 36.0 g/dL   RDW 65.713.5 84.611.5 - 96.215.5 %   Platelets 239 150 - 400 K/uL     MAU Course  Procedures  MDM   Assessment and Plan   1. Postpartum hemorrhage, unspecified type   2. Postpartum exam    DC home Comfort measures reviewed   Bleeding precautions RX: none  Return to MAU as needed FU with OB as planned  Follow-up Information    [facility] Follow up.   Specialty:  Obstetrics and Gynecology Contact information: [address], [address] [place] [id number] [phone]           Thressa ShellerHeather Emmamarie Kluender 01/09/2017, 8:25 PM

## 2017-02-03 ENCOUNTER — Encounter: Payer: Self-pay | Admitting: Certified Nurse Midwife

## 2017-02-03 ENCOUNTER — Other Ambulatory Visit (HOSPITAL_COMMUNITY)
Admission: RE | Admit: 2017-02-03 | Discharge: 2017-02-03 | Disposition: A | Discharge status: Home / Self Care | Payer: 59 | Source: Ambulatory Visit | Attending: Certified Nurse Midwife | Admitting: Certified Nurse Midwife

## 2017-02-03 ENCOUNTER — Ambulatory Visit (INDEPENDENT_AMBULATORY_CARE_PROVIDER_SITE_OTHER): Payer: 59 | Admitting: Certified Nurse Midwife

## 2017-02-03 DIAGNOSIS — Z30013 Encounter for initial prescription of injectable contraceptive: Secondary | ICD-10-CM

## 2017-02-03 DIAGNOSIS — Z3202 Encounter for pregnancy test, result negative: Secondary | ICD-10-CM | POA: Diagnosis not present

## 2017-02-03 DIAGNOSIS — Z3042 Encounter for surveillance of injectable contraceptive: Secondary | ICD-10-CM | POA: Diagnosis not present

## 2017-02-03 DIAGNOSIS — N898 Other specified noninflammatory disorders of vagina: Secondary | ICD-10-CM

## 2017-02-03 LAB — POCT URINE PREGNANCY: Preg Test, Ur: NEGATIVE

## 2017-02-03 MED ORDER — MEDROXYPROGESTERONE ACETATE 150 MG/ML IM SUSP
150.0000 mg | Freq: Once | INTRAMUSCULAR | Status: AC
  Administered 2017-02-03: 150 mg via INTRAMUSCULAR

## 2017-02-03 NOTE — Progress Notes (Signed)
Pt received depo injection at today's visit, supplied from office stock. Pt tolerated injection well. Pt advised to RTO on 04/27/17 for next depo. Depo policy reviewed with pt.   Administrations This Visit    medroxyPROGESTERone (DEPO-PROVERA) injection 150 mg    Admin Date 02/03/2017 Action Given Dose 150 mg Route Intramuscular Administered By Lanney Gins, CMA

## 2017-02-03 NOTE — Progress Notes (Signed)
Subjective:     Jamie [name] is a 26 y.o. female who presents for a postpartum visit. She is 4 weeks postpartum following a spontaneous vaginal delivery. I have fully reviewed the prenatal and intrapartum course. The delivery was at 40.5 gestational weeks. Outcome: spontaneous vaginal delivery. Anesthesia: none. Postpartum course has been GOOD. Baby's course has been GOOD. Baby is feeding by breast. Bleeding no bleeding. Bowel function is normal. Bladder function is normal. Patient is not sexually active. Contraception method is none. She wants DEPO.   Postpartum depression screening: negative=0.    The following portions of the patient's history were reviewed and updated as appropriate: allergies, current medications, past family history, past medical history, past social history, past surgical history and problem list.  Review of Systems Pertinent items noted in HPI and remainder of comprehensive ROS otherwise negative.   Objective:    BP 127/71   Pulse 77   Ht  (1.753 m)   Wt 245 lb 12.8 oz (111.5 kg)   LMP 03/26/2016   Breastfeeding? Yes   BMI 36.30 kg/m   General:  alert, cooperative and no distress   Breasts:  inspection negative, no nipple discharge or bleeding, no masses or nodularity palpable  Lungs: clear to auscultation bilaterally  Heart:  regular rate and rhythm, S1, S2 normal, no murmur, click, rub or gallop  Abdomen: soft, non-tender; bowel sounds normal; no masses,  no organomegaly   Vulva:  normal  Vagina: normal vagina and white copious discharge noted  Cervix:  no bleeding following Pap  Corpus: normal size, contour, position, consistency, mobility, non-tender  Adnexa:  normal adnexa  Rectal Exam: Not performed.       12/13/15: normal pap smear      Negative UPT  Assessment:     Normal 4 week postpartum exam. Pap smear done at today's visit.    Vaginal discharge    Contraception management: started on Depo injections  Plan:    1. Contraception:  abstinence 2. Started on depo in office today.  3. Follow up in: 1 year for annual exam/every 3 months for depo injections or as needed.

## 2017-02-04 LAB — CERVICOVAGINAL ANCILLARY ONLY
Bacterial vaginitis: POSITIVE — AB
CANDIDA VAGINITIS: NEGATIVE

## 2017-02-05 ENCOUNTER — Other Ambulatory Visit: Payer: Self-pay | Admitting: Certified Nurse Midwife

## 2017-02-05 DIAGNOSIS — B9689 Other specified bacterial agents as the cause of diseases classified elsewhere: Secondary | ICD-10-CM

## 2017-02-05 DIAGNOSIS — N760 Acute vaginitis: Principal | ICD-10-CM

## 2017-02-05 LAB — CYTOLOGY - PAP
Adequacy: ABSENT
Diagnosis: NEGATIVE

## 2017-02-05 MED ORDER — SECNIDAZOLE 2 G PO PACK: 2.0000 g | PACK | Freq: Once | ORAL | 0 refills | Status: AC

## 2017-04-24 ENCOUNTER — Ambulatory Visit: Payer: 59

## 2017-05-01 ENCOUNTER — Other Ambulatory Visit: Payer: Self-pay | Admitting: *Deleted

## 2017-05-01 ENCOUNTER — Ambulatory Visit (INDEPENDENT_AMBULATORY_CARE_PROVIDER_SITE_OTHER): Payer: 59

## 2017-05-01 DIAGNOSIS — Z30013 Encounter for initial prescription of injectable contraceptive: Secondary | ICD-10-CM

## 2017-05-01 DIAGNOSIS — Z3042 Encounter for surveillance of injectable contraceptive: Secondary | ICD-10-CM

## 2017-05-01 MED ORDER — MEDROXYPROGESTERONE ACETATE 150 MG/ML IM SUSP
150.0000 mg | Freq: Once | INTRAMUSCULAR | Status: AC
  Administered 2017-05-01: 150 mg via INTRAMUSCULAR

## 2017-05-01 MED ORDER — MEDROXYPROGESTERONE ACETATE 150 MG/ML IM SUSP: 150.0000 mg | INTRAMUSCULAR | 0 refills | Status: DC

## 2017-05-01 NOTE — Progress Notes (Signed)
Depo ordered to pharmacy. Rx was not sent after initial dose given in office.  Pt is to come into office with in the next week to get injection.

## 2017-05-01 NOTE — Progress Notes (Signed)
Pt presents for Depo injection today.  Pt supplied. Injection done w/o any problems Next Depo due 07/17/16-07/31/2016 Pt made aware.

## 2017-05-06 ENCOUNTER — Other Ambulatory Visit: Payer: Self-pay

## 2017-05-06 MED ORDER — MEDROXYPROGESTERONE ACETATE 150 MG/ML IM SUSP: 150.0000 mg | INTRAMUSCULAR | 2 refills | Status: DC

## 2017-07-21 ENCOUNTER — Other Ambulatory Visit: Payer: Self-pay

## 2017-07-21 ENCOUNTER — Ambulatory Visit (INDEPENDENT_AMBULATORY_CARE_PROVIDER_SITE_OTHER): Payer: 59

## 2017-07-21 VITALS — BP 121/82 | HR 99 | Ht 70.0 in | Wt 246.5 lb

## 2017-07-21 DIAGNOSIS — Z3042 Encounter for surveillance of injectable contraceptive: Secondary | ICD-10-CM | POA: Diagnosis not present

## 2017-07-21 MED ORDER — MEDROXYPROGESTERONE ACETATE 150 MG/ML IM SUSP
150.0000 mg | Freq: Once | INTRAMUSCULAR | Status: AC
  Administered 2017-07-21: 150 mg via INTRAMUSCULAR

## 2017-07-21 NOTE — Progress Notes (Signed)
Presents for DEPO.  Given in right deltoid, tolerated well. Next DEPO 6/4-18/19  Administrations This Visit    medroxyPROGESTERone (DEPO-PROVERA) injection 150 mg    Admin Date 07/21/2017 Action Given Dose 150 mg Route Intramuscular Administered By Maretta BeesMcGlashan, Boris Engelmann J, RMA

## 2017-07-21 NOTE — Progress Notes (Signed)
I have reviewed this chart and agree with the RN/CMA assessment and management.    K. Meryl Davis, M.D. Attending Obstetrician & Gynecologist, [facility], [facility]  

## 2017-09-04 IMAGING — US US PELVIS COMPLETE
1 series · 15 of 25 positions shown · non-contrast
Comparison: None

CLINICAL DATA: Abnormal uterine bleeding for the past month.
Patient has an [name].

EXAM:
TRANSABDOMINAL AND TRANSVAGINAL ULTRASOUND OF PELVIS
TECHNIQUE: Both transabdominal and transvaginal ultrasound examinations of the
pelvis were performed. Transabdominal technique was performed for
global imaging of the pelvis including uterus, ovaries, adnexal
regions, and pelvic cul-de-sac. It was necessary to proceed with
endovaginal exam following the transabdominal exam to visualize the
uterus, endometrium, ovaries, and adnexal regions..

[Series 1: us pelvis complete · 15 of 49 slices shown]
[im 1/49]
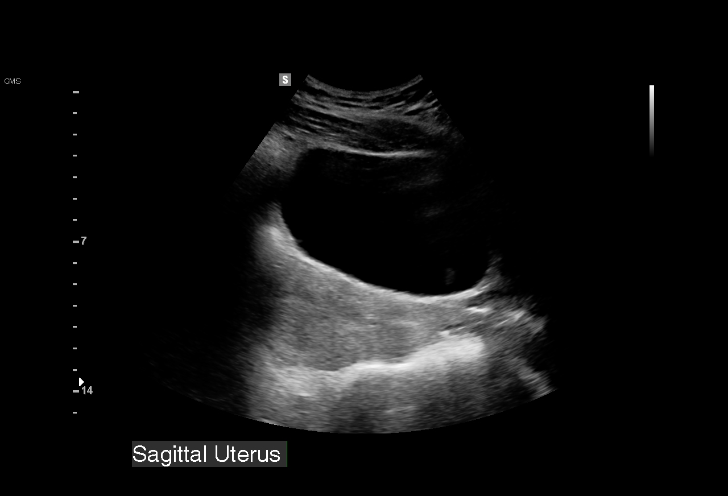
[im 5/49]
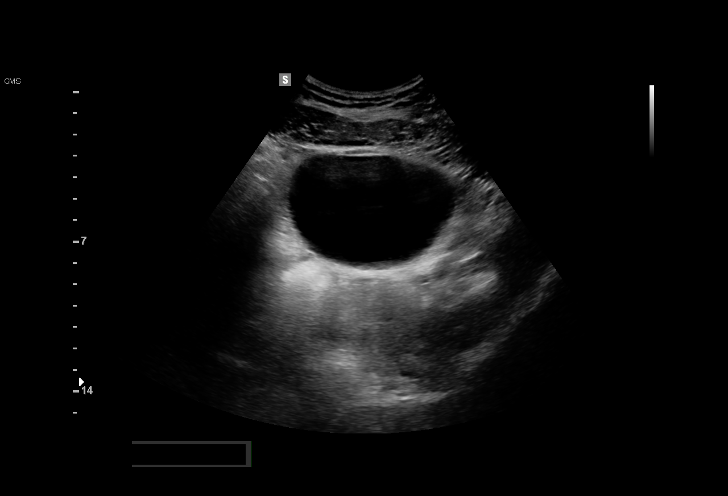
[im 9/49]
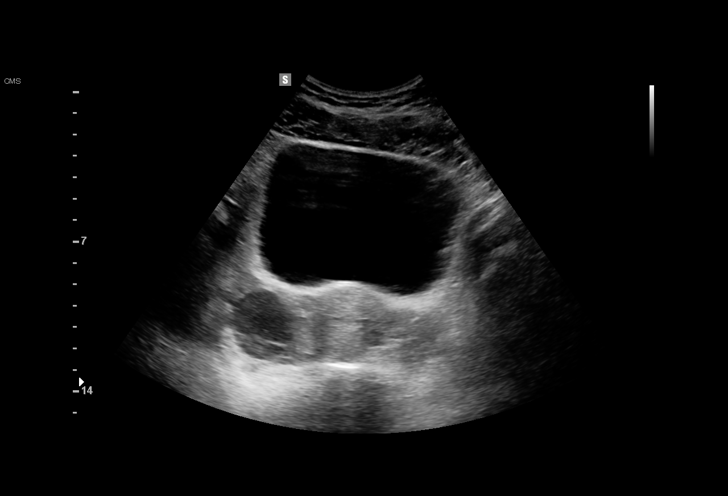
[im 11/49]
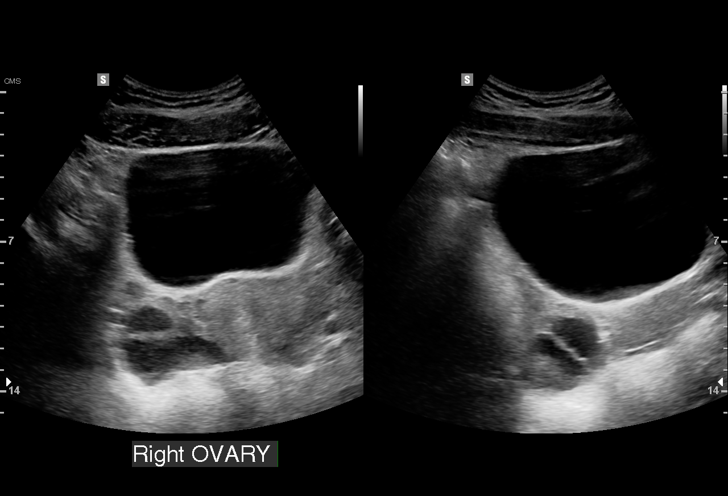
[im 15/49]
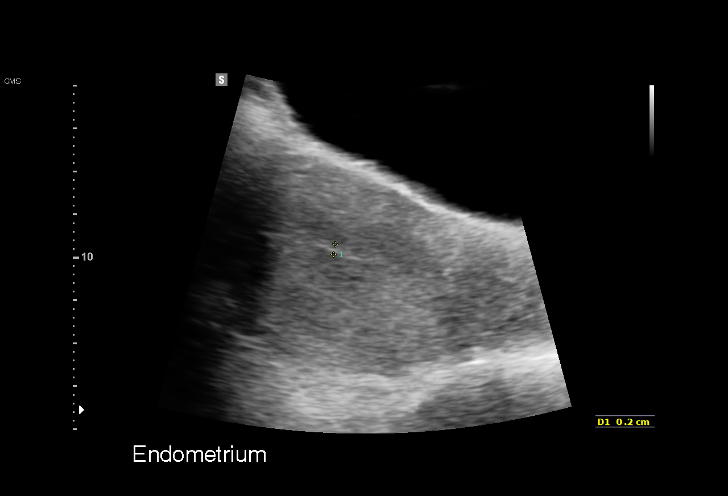
[im 19/49]
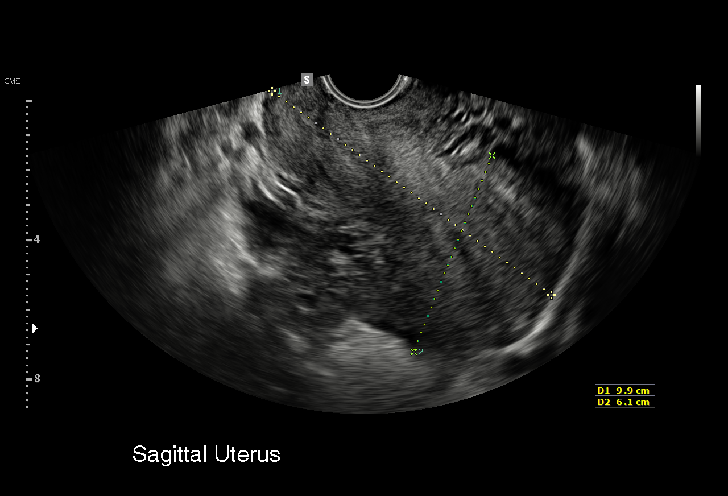
[im 21/49]
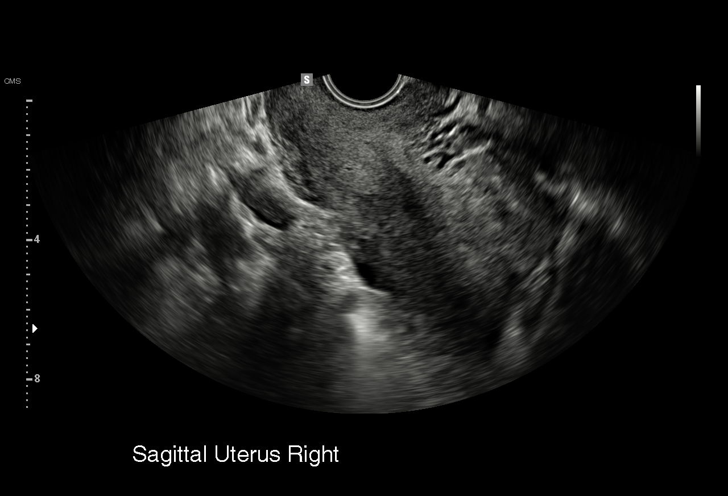
[im 25/49]
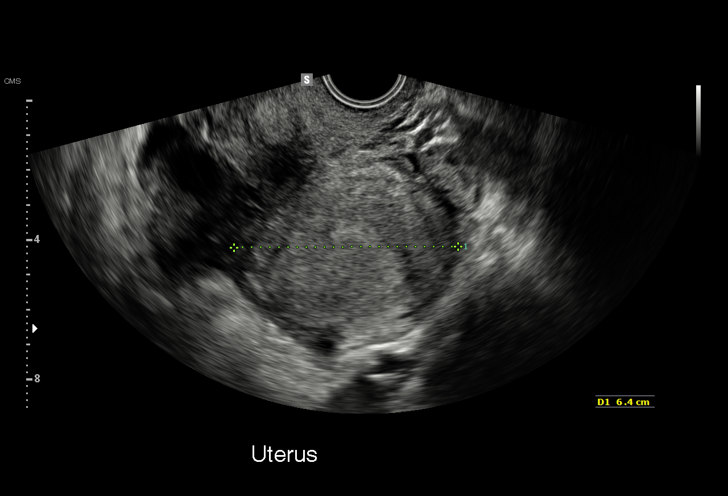
[im 29/49]
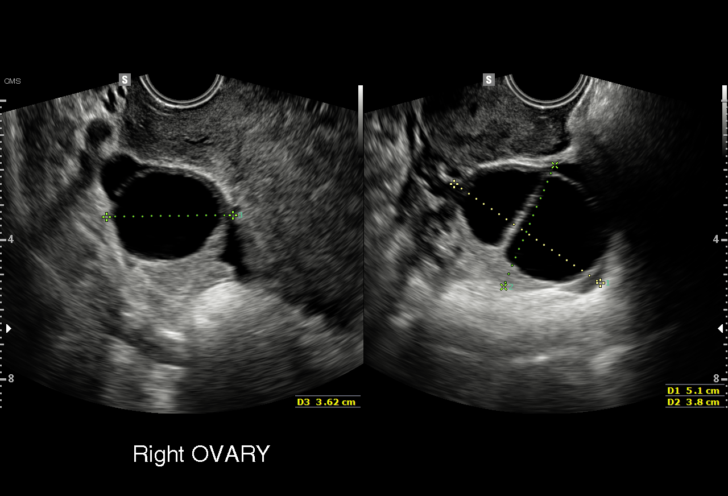
[im 31/49]
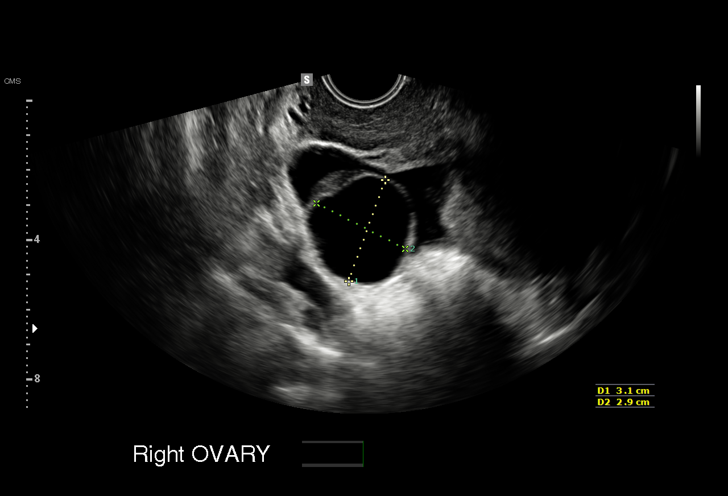
[im 35/49]
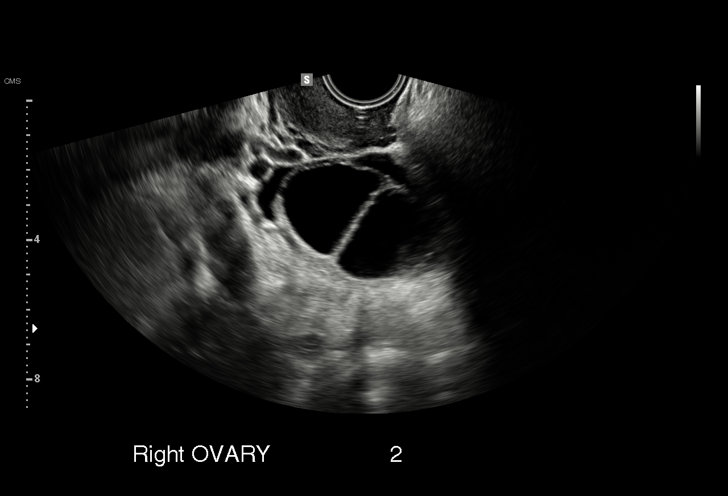
[im 39/49]
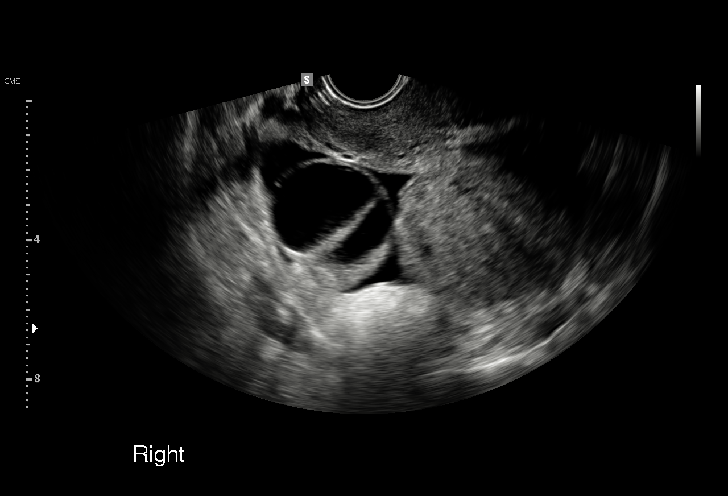
[im 41/49]
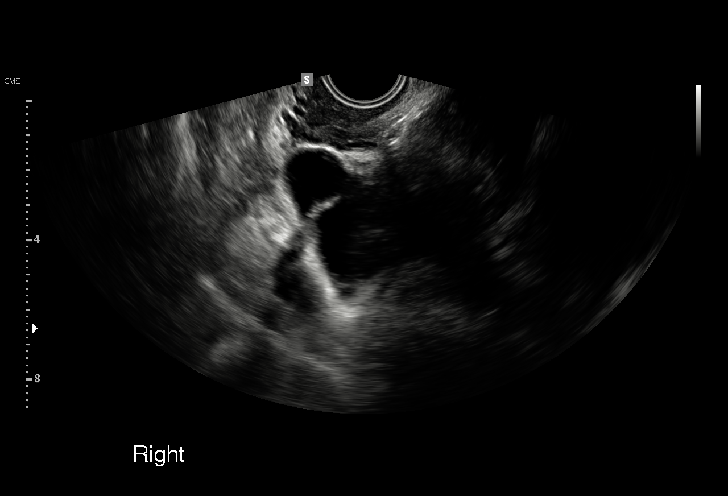
[im 45/49]
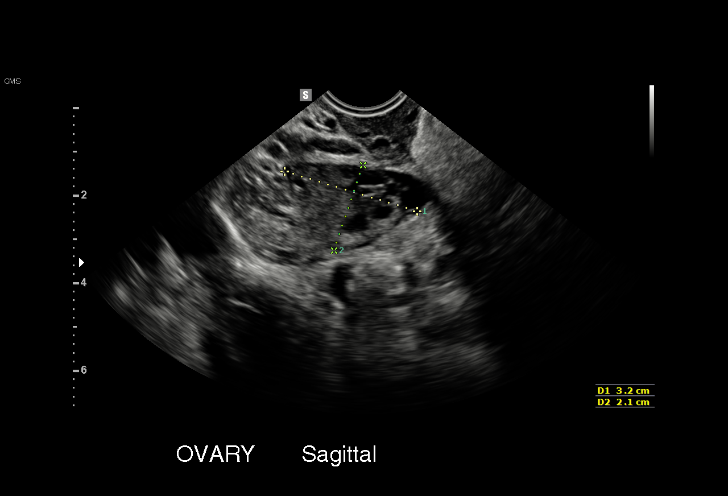
[im 49/49]
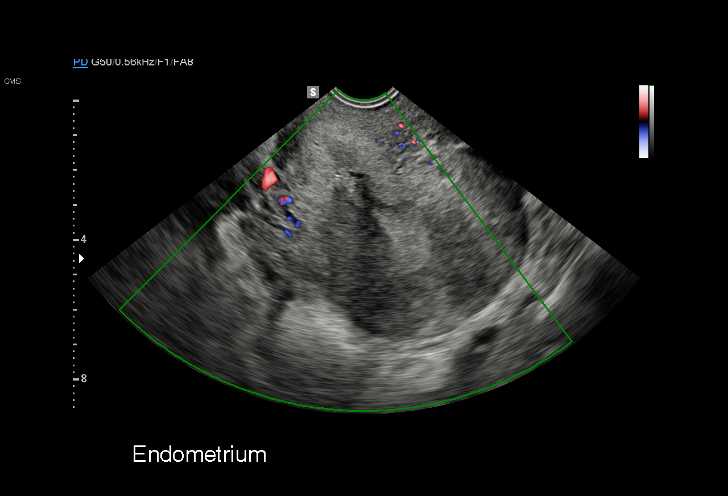

[15 of 25 positions shown; findings below may reference images not displayed]

FINDINGS: Uterus

Measurements: 9.9 x 6.1 x 6.4 cm. The [name] is positioned low and the
arms and possibly a portion of the shaft of the [name] appears to lie
within the parenchyma of the cervix. No uterine fibroids are
observed.

Endometrium

Thickness: 11 mm.  No focal abnormality visualized.

Right ovary

Measurements: 5.1 x 3.8 x 3.6 cm.. There 2 anechoic structures in
the ovary. One measures 3.1 x 2.9 x 2.9 cm. The second measures
x 2.7 x 2.6 cm

Left ovary

Measurements: 3.2 x 2.1 x 2.2 cm. Normal appearance/no adnexal mass.

Other findings

There is a trace of free pelvic fluid.
IMPRESSION: 1. Apparent malpositioning of the [name]. It lies low in the cervix
with probable penetration of the arms into the parenchyma of the
cervix. This is likely responsible for the patient's vaginal
bleeding. Direct visualization is recommended to judge if the [name]
can be retrieved vaginally.
2. Otherwise normal appearance of the uterus. The endometrium
measures 11 mm with no abnormal fluid collections.
3. Two simple cysts in the right ovary. Normal-appearing left ovary.
4. These results were called by telephone at the time of
interpretation on 12/21/2015 at [DATE] to [HOSPITAL] [REDACTED] the
office of Dr. MARTA HENIEK MALMON . The office was closed and report was
left on the recording device.

## 2017-09-10 ENCOUNTER — Encounter: Payer: Self-pay | Admitting: Obstetrics

## 2017-09-10 ENCOUNTER — Ambulatory Visit (INDEPENDENT_AMBULATORY_CARE_PROVIDER_SITE_OTHER): Payer: 59 | Admitting: Obstetrics

## 2017-09-10 ENCOUNTER — Other Ambulatory Visit (HOSPITAL_COMMUNITY)
Admission: RE | Admit: 2017-09-10 | Discharge: 2017-09-10 | Disposition: A | Discharge status: Home / Self Care | Payer: 59 | Source: Ambulatory Visit | Attending: Obstetrics | Admitting: Obstetrics

## 2017-09-10 VITALS — BP 112/75 | HR 89 | Ht 69.0 in | Wt 253.8 lb

## 2017-09-10 DIAGNOSIS — N898 Other specified noninflammatory disorders of vagina: Secondary | ICD-10-CM | POA: Diagnosis not present

## 2017-09-10 DIAGNOSIS — B9689 Other specified bacterial agents as the cause of diseases classified elsewhere: Secondary | ICD-10-CM | POA: Diagnosis not present

## 2017-09-10 DIAGNOSIS — R300 Dysuria: Secondary | ICD-10-CM | POA: Diagnosis not present

## 2017-09-10 DIAGNOSIS — B373 Candidiasis of vulva and vagina: Secondary | ICD-10-CM | POA: Diagnosis not present

## 2017-09-10 MED ORDER — TERCONAZOLE 0.8 % VA CREA: 1.0000 | TOPICAL_CREAM | Freq: Every day | VAGINAL | 0 refills | Status: DC

## 2017-09-10 MED ORDER — CEFUROXIME AXETIL 250 MG/5ML PO SUSR: 500.0000 mg | Freq: Two times a day (BID) | ORAL | 2 refills | Status: DC

## 2017-09-10 NOTE — Progress Notes (Signed)
Patient ID: Sabreena Vogan, female   DOB: April 10, 1991, 27 y.o.   MRN: [id number]  Chief Complaint  Patient presents with  . Vaginitis    HPI Jolita Friddle is a 27 y.o. female.  Vaginal itching and discharge.  Burning with urination and backache. HPI  History reviewed. No pertinent past medical history.  Past Surgical History:  Procedure Laterality Date  . NO PAST SURGERIES      Family History  Problem Relation Age of Onset  . Heart murmur Mother   . Diabetes Father   . Stroke Father   . Heart disease Father   . Heart failure Father   . Diabetes Maternal Grandfather   . Heart disease Maternal Grandfather   . Hypertension Maternal Grandfather   . Heart attack Paternal Grandmother   . Arrhythmia Brother   . Healthy Brother     Social History Social History   Tobacco Use  . Smoking status: Never Smoker  . Smokeless tobacco: Never Used  Substance Use Topics  . Alcohol use: No  . Drug use: No    Allergies  Allergen Reactions  . Shellfish Allergy Swelling    Throat     Current Outpatient Medications  Medication Sig Dispense Refill  . albuterol (PROVENTIL HFA;VENTOLIN HFA) 108 (90 Base) MCG/ACT inhaler Inhale 1-2 puffs into the lungs every 6 (six) hours as needed for wheezing or shortness of breath.    Marland Kitchen ibuprofen (ADVIL,MOTRIN) 600 MG tablet Take 1 tablet (600 mg total) by mouth every 6 (six) hours. 30 tablet 0  . medroxyPROGESTERone (DEPO-PROVERA) 150 MG/ML injection Inject 1 mL (150 mg total) into the muscle every 3 (three) months. 1 mL 0  . medroxyPROGESTERone (DEPO-PROVERA) 150 MG/ML injection Inject 1 mL (150 mg total) into the muscle every 3 (three) months. 1 mL 2  . Prenatal MV-Min-FA-Omega-3 (PRENATAL GUMMIES/DHA & FA) 0.4-32.5 MG CHEW 1 chew by mouth daily    . Prenatal-DSS-FeCb-FeGl-FA (CITRANATAL BLOOM) 90-1 MG TABS Take 1 tablet by mouth daily. 30 tablet 12  . cefUROXime (CEFTIN) 250 MG/5ML suspension Take 10 mLs (500 mg total) by mouth 2 (two) times daily.  140 mL 2  . Elastic Bandages & Supports (COMFORT FIT MATERNITY SUPP LG) MISC 1 Units by Does not apply route daily. (Patient not taking: Reported on 12/08/2016) 1 each 0  . terconazole (TERAZOL 3) 0.8 % vaginal cream Place 1 applicator vaginally at bedtime. 20 g 0   No current facility-administered medications for this visit.     Review of Systems Review of Systems Constitutional: negative for fatigue and weight loss Respiratory: negative for cough and wheezing Cardiovascular: negative for chest pain, fatigue and palpitations Gastrointestinal: negative for abdominal pain and change in bowel habits Genitourinary: POSITIVE for vaginal itching, discharge, and burning with urination and backache. Integument/breast: negative for nipple discharge Musculoskeletal:negative for myalgias Neurological: negative for gait problems and tremors Behavioral/Psych: negative for abusive relationship, depression Endocrine: negative for temperature intolerance      Blood pressure 112/75, pulse 89, height  (1.753 m), weight 253 lb 12.8 oz (115.1 kg), currently breastfeeding.  Physical Exam Physical Exam           General:  Alert and no distress Abdomen:  normal findings: no organomegaly, soft, non-tender and no hernia  Pelvis:  External genitalia: normal general appearance Urinary system: urethral meatus normal and bladder without fullness, nontender Vaginal: normal without tenderness, induration or masses Cervix: normal appearance Adnexa: normal bimanual exam Uterus: anteverted and non-tender, normal size  50% of 15 min visit spent on counseling and coordination of care.   Data Reviewed Wet prep and cultures  Assessment     1. Vaginal discharge and irritation Rx: - Cervicovaginal ancillary only - terconazole (TERAZOL 3) 0.8 % vaginal cream; Place 1 applicator vaginally at bedtime.  Dispense: 20 g; Refill: 0  2. Dysuria Rx: - Urine Culture - cefUROXime (CEFTIN) 250 MG/5ML suspension;  Take 10 mLs (500 mg total) by mouth 2 (two) times daily.  Dispense: 140 mL; Refill: 2    Plan   FOLLOW UP PRN  Orders Placed This Encounter  Procedures  . Urine Culture   Meds ordered this encounter  Medications  . terconazole (TERAZOL 3) 0.8 % vaginal cream    Sig: Place 1 applicator vaginally at bedtime.    Dispense:  20 g    Refill:  0  . cefUROXime (CEFTIN) 250 MG/5ML suspension    Sig: Take 10 mLs (500 mg total) by mouth 2 (two) times daily.    Dispense:  140 mL    Refill:  2    Brock Bad MD 09-10-2017

## 2017-09-10 NOTE — Progress Notes (Signed)
RGYN patient complains of possible yeast infection.

## 2017-09-11 LAB — CERVICOVAGINAL ANCILLARY ONLY
Bacterial vaginitis: POSITIVE — AB
CHLAMYDIA, DNA PROBE: NEGATIVE
Candida vaginitis: POSITIVE — AB
Neisseria Gonorrhea: NEGATIVE
TRICH (WINDOWPATH): NEGATIVE

## 2017-09-12 ENCOUNTER — Other Ambulatory Visit: Payer: Self-pay | Admitting: Obstetrics

## 2017-09-12 DIAGNOSIS — B9689 Other specified bacterial agents as the cause of diseases classified elsewhere: Secondary | ICD-10-CM

## 2017-09-12 DIAGNOSIS — N760 Acute vaginitis: Principal | ICD-10-CM

## 2017-09-12 LAB — URINE CULTURE: Organism ID, Bacteria: NO GROWTH

## 2017-09-12 MED ORDER — METRONIDAZOLE 500 MG PO TABS: 500.0000 mg | ORAL_TABLET | Freq: Two times a day (BID) | ORAL | 2 refills | Status: DC

## 2017-10-13 ENCOUNTER — Ambulatory Visit (INDEPENDENT_AMBULATORY_CARE_PROVIDER_SITE_OTHER): Payer: 59

## 2017-10-13 VITALS — BP 116/74 | HR 85 | Wt 251.6 lb

## 2017-10-13 DIAGNOSIS — Z3042 Encounter for surveillance of injectable contraceptive: Secondary | ICD-10-CM | POA: Diagnosis not present

## 2017-10-13 DIAGNOSIS — Z3049 Encounter for surveillance of other contraceptives: Secondary | ICD-10-CM

## 2017-10-13 MED ORDER — MEDROXYPROGESTERONE ACETATE 150 MG/ML IM SUSP
150.0000 mg | INTRAMUSCULAR | Status: DC
  Administered 2017-10-13 – 2019-02-15 (×4): 150 mg via INTRAMUSCULAR

## 2017-10-13 NOTE — Progress Notes (Signed)
Pt here for depo injection. Given in L deltoid, pt tolerated well. Next depo due 8/27-9/10.

## 2017-12-29 ENCOUNTER — Ambulatory Visit (INDEPENDENT_AMBULATORY_CARE_PROVIDER_SITE_OTHER): Payer: 59

## 2017-12-29 DIAGNOSIS — Z3042 Encounter for surveillance of injectable contraceptive: Secondary | ICD-10-CM | POA: Diagnosis not present

## 2017-12-29 NOTE — Progress Notes (Signed)
Nurse visit for pt supplied Depo. Pt is within her window. Depo given R Del w/o complaints. Next Depo due Nov 12-26, pt agrees.

## 2017-12-29 NOTE — Progress Notes (Signed)
I have reviewed this chart and agree with the RN/CMA assessment and management.    K. Meryl Davis, M.D. [facility]  

## 2018-01-30 ENCOUNTER — Ambulatory Visit (HOSPITAL_COMMUNITY)
Admission: EM | Admit: 2018-01-30 | Discharge: 2018-01-30 | Disposition: A | Discharge status: Home / Self Care | Payer: 59 | Attending: Physician Assistant | Admitting: Physician Assistant

## 2018-01-30 DIAGNOSIS — J069 Acute upper respiratory infection, unspecified: Secondary | ICD-10-CM | POA: Diagnosis not present

## 2018-01-30 MED ORDER — IBUPROFEN 600 MG PO TABS: 600.0000 mg | ORAL_TABLET | Freq: Four times a day (QID) | ORAL | 0 refills | Status: DC | PRN

## 2018-01-30 MED ORDER — ALBUTEROL SULFATE HFA 108 (90 BASE) MCG/ACT IN AERS: 1.0000 | INHALATION_SPRAY | Freq: Four times a day (QID) | RESPIRATORY_TRACT | 1 refills | Status: DC | PRN

## 2018-01-30 MED ORDER — CETIRIZINE-PSEUDOEPHEDRINE ER 5-120 MG PO TB12: 1.0000 | ORAL_TABLET | Freq: Two times a day (BID) | ORAL | 0 refills | Status: AC

## 2018-01-30 NOTE — Discharge Instructions (Addendum)
Use your inhaler every 4 hours as needed. Please take the zyrtec D and ibuprofen as prescribed.

## 2018-01-30 NOTE — ED Provider Notes (Signed)
01/30/2018 2:35 PM   DOB: [birth year] / MRN: [id number]  SUBJECTIVE:  Kaydyn Chism is a 27 y.o. female presenting for cough that started about 4 days ago.  Assoicates nasal congestion and drainage.  Denies SOB, chest pain.  Has tried OTC analgesics and mucinex. No history of smoking.    She is allergic to shellfish allergy.   She  has no past medical history on file.    She  reports that she has never smoked. She has never used smokeless tobacco. She reports that she does not drink alcohol or use drugs. She  reports that she currently engages in sexual activity and has had partner(s) who are Female. She reports using the following method of birth control/protection: Injection. The patient  has a past surgical history that includes No past surgeries.  Her family history includes Arrhythmia in her brother; Diabetes in her father and maternal grandfather; Healthy in her brother; Heart attack in her paternal grandmother; Heart disease in her father and maternal grandfather; Heart failure in her father; Heart murmur in her mother; Hypertension in her maternal grandfather; Stroke in her father.  Review of Systems  Constitutional: Negative for chills, diaphoresis and fever.  Eyes: Negative.   Respiratory: Positive for cough and sputum production. Negative for hemoptysis, shortness of breath and wheezing.   Cardiovascular: Negative for chest pain, orthopnea and leg swelling.  Gastrointestinal: Negative.  Negative for nausea.  Genitourinary: Negative.   Skin: Negative for rash.  Neurological: Negative for dizziness, sensory change, speech change, focal weakness and headaches.    OBJECTIVE:  There were no vitals taken for this visit.  Wt Readings from Last 3 Encounters:  10/13/17 251 lb 9.6 oz (114.1 kg)  09/10/17 253 lb 12.8 oz (115.1 kg)  07/21/17 246 lb 8 oz (111.8 kg)   Temp Readings from Last 3 Encounters:  01/09/17 98.2 F (36.8 C)  01/07/17 97.9 F (36.6 C) (Oral)  12/31/16 98.1 F  (36.7 C) (Oral)   BP Readings from Last 3 Encounters:  10/13/17 116/74  09/10/17 112/75  07/21/17 121/82   Pulse Readings from Last 3 Encounters:  10/13/17 85  09/10/17 89  07/21/17 99    Physical Exam  Constitutional: She is oriented to person, place, and time. She appears well-nourished.  Non-toxic appearance. No distress.  HENT:  Right Ear: External ear normal.  Left Ear: External ear normal.  Nose: Mucosal edema present. Right sinus exhibits no maxillary sinus tenderness and no frontal sinus tenderness. Left sinus exhibits no maxillary sinus tenderness and no frontal sinus tenderness.  Mouth/Throat: Oropharynx is clear and moist. No oropharyngeal exudate.  Eyes: Pupils are equal, round, and reactive to light. Conjunctivae and EOM are normal.  Cardiovascular: Normal rate, regular rhythm, S1 normal, S2 normal, normal heart sounds and intact distal pulses. Exam reveals no gallop, no friction rub and no decreased pulses.  No murmur heard. Pulmonary/Chest: Effort normal and breath sounds normal. No stridor. No respiratory distress. She has no wheezes. She has no rales.  Abdominal: She exhibits no distension.  Musculoskeletal: She exhibits no edema.  Neurological: She is alert and oriented to person, place, and time. No cranial nerve deficit. Gait normal.  Skin: Skin is warm and dry. No rash noted. She is not diaphoretic. No erythema. No pallor.  Psychiatric: She has a normal mood and affect. Her behavior is normal.  Vitals reviewed.   No results found for this or any previous visit (from the past 72 hour(s)).  No results found.  ASSESSMENT  AND PLAN:   Acute URI    Discharge Instructions     Use your inhaler every 4 hours as needed. Please take the zyrtec D and ibuprofen as prescribed.         The patient is advised to call or return to clinic if she does not see an improvement in symptoms, or to seek the care of the closest emergency department if she worsens with  the above plan.   Deliah Boston, MHS, PA-C 01/30/2018 2:35 PM   Ofilia Neas, PA-C 01/30/18 1435

## 2018-01-30 NOTE — ED Notes (Signed)
Unaware pt was not triaged prior to discharge. Pt discharged to home.

## 2018-03-10 IMAGING — US US MFM FETAL NUCHAL TRANSLUCENCY
1 series · 13 of 28 positions shown · non-contrast
Comparison: none

[Series 1: us mfm fetal nuchal translucency · 13 of 51 slices shown]
[im 2/51]
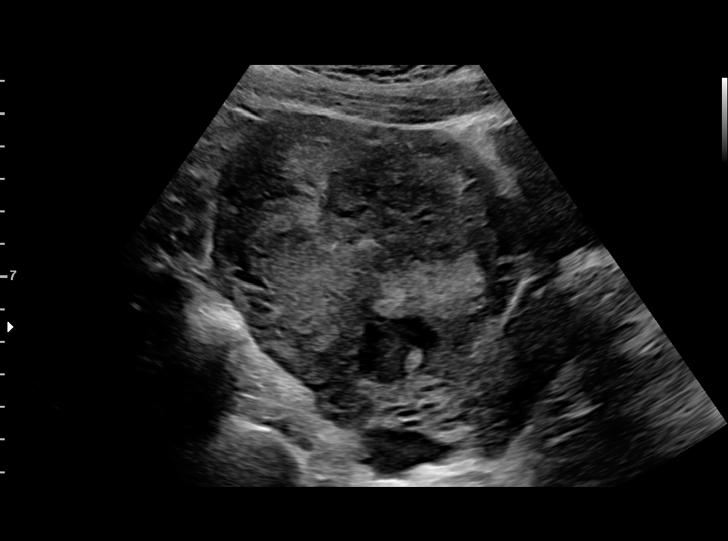
[im 6/51]
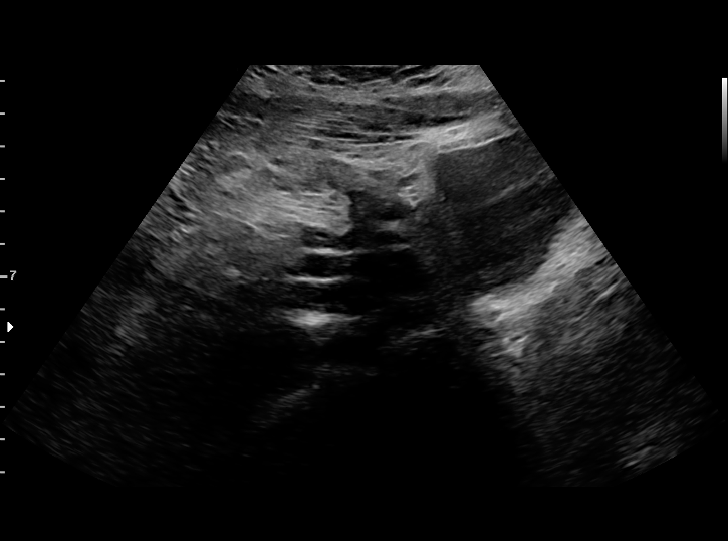
[im 10/51]
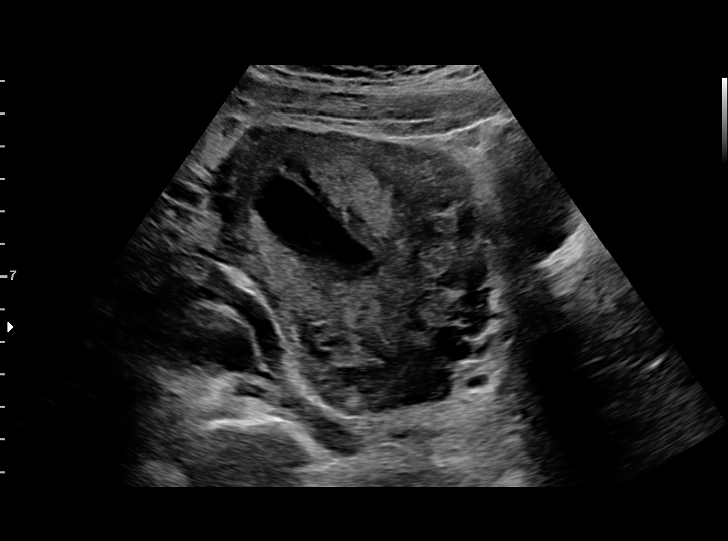
[im 13/51]
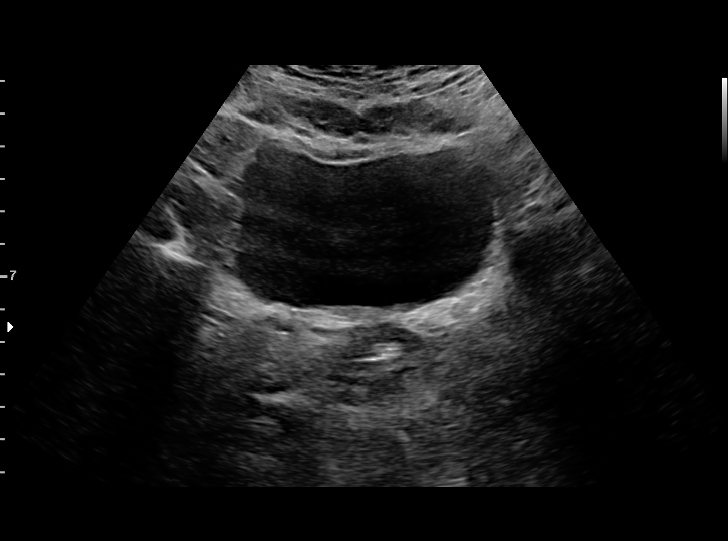
[im 17/51]
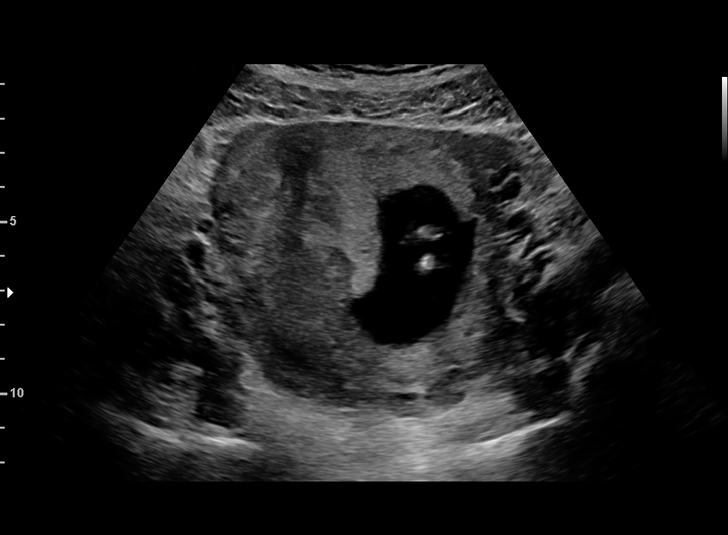
[im 21/51]
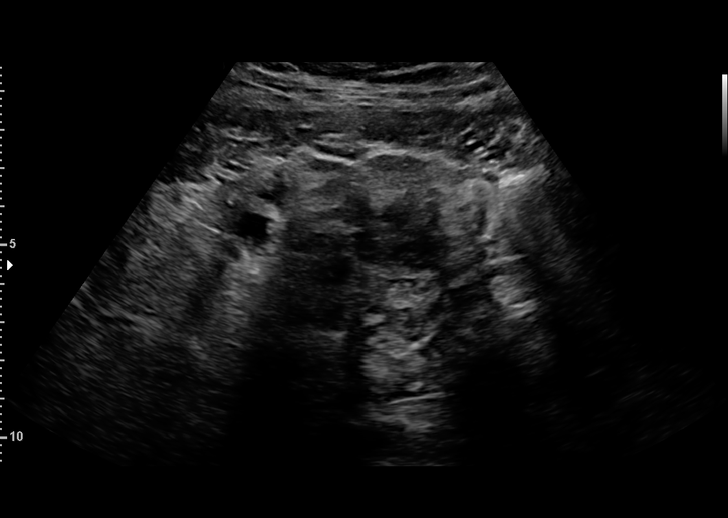
[im 26/51]
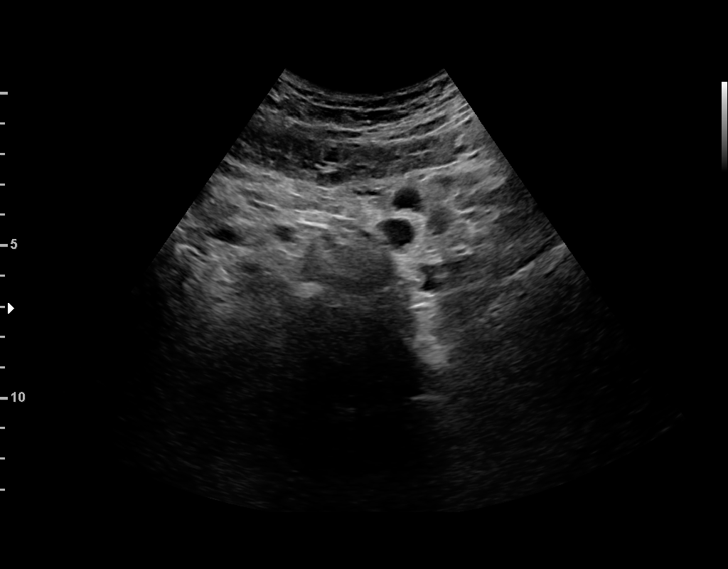
[im 30/51]
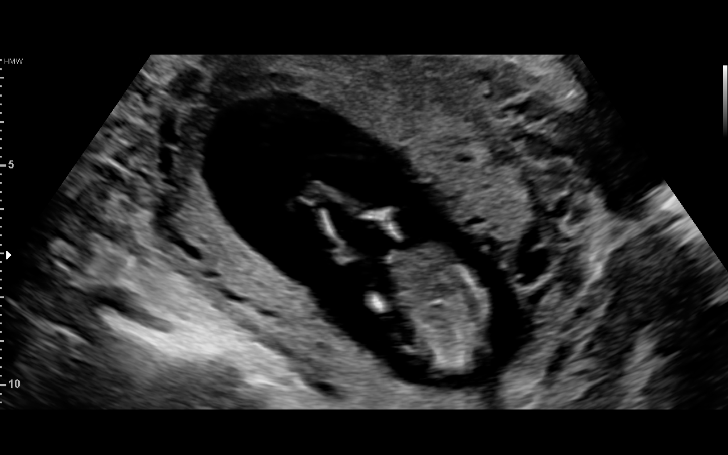
[im 34/51]
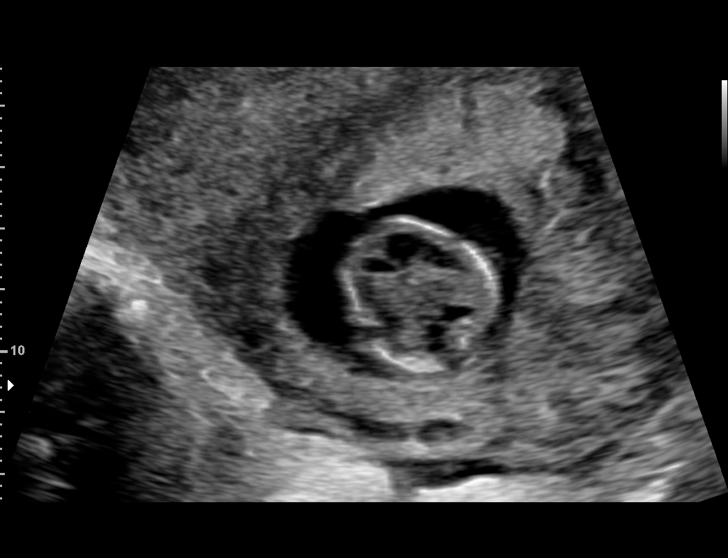
[im 38/51]
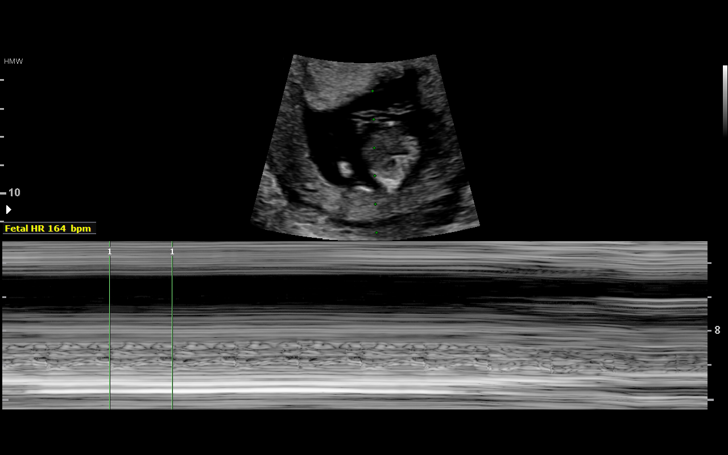
[im 41/51]
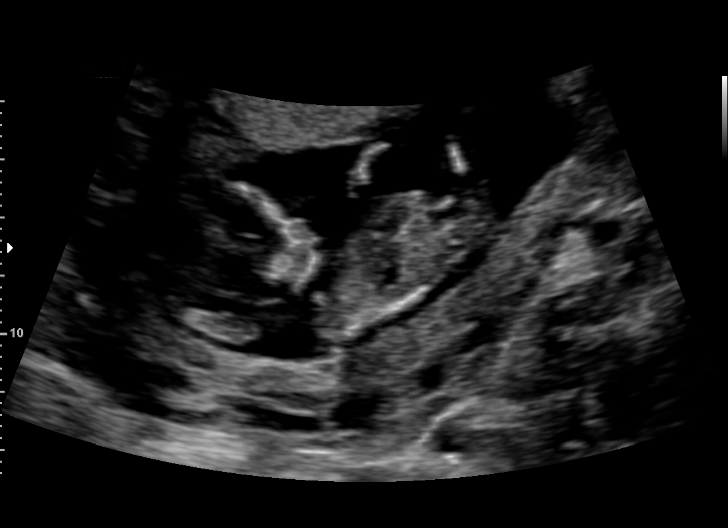
[im 45/51]
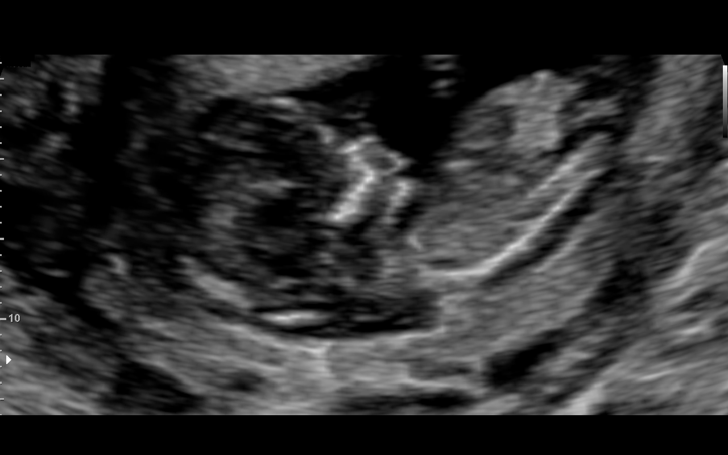
[im 49/51]
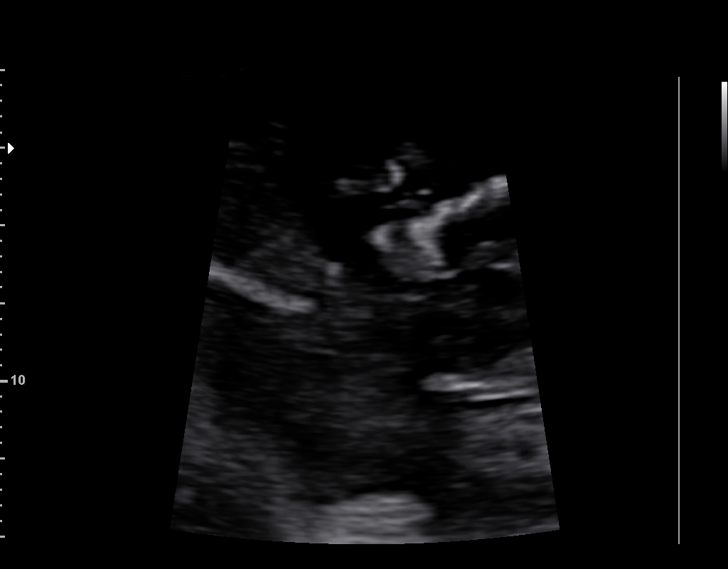

[13 of 28 positions shown; findings below may reference images not displayed]

TRANSLUCENCY

1  KARGER CHATO           [id number]      [id number]     [id number]
Indications

13 weeks gestation of pregnancy
Encounter for nuchal translucency
Obesity complicating pregnancy, first
trimester
OB History

Blood Type:            Height:  5'9"   Weight (lb):  252      BMI:
Gravidity:    2         Term:   1
Living:       1
Fetal Evaluation

Num Of Fetuses:     1
Preg. Location:     Intrauterine
Gest. Sac:          Intrauterine
Yolk Sac:           Not visualized
Fetal Pole:         Visualized
Fetal Heart         164
Rate(bpm):
Cardiac Activity:   Observed
Presentation:       Variable
Placenta:           Anterior

Amniotic Fluid
AFI FV:      Subjectively within normal limits
Gestational Age

LMP:           13w 0d       Date:   03/26/16                 EDD:   12/31/16
Best:          13w 0d    Det. By:   LMP  (03/26/16)          EDD:   12/31/16
1st Trimester Genetic Sonogram Screening

CRL:            60.7  mm    G. Age:   12w 3d                 EDD:   01/04/17
Nuc Trans:       1.3  mm
Nasal Bone:                 Present
Anatomy

Choroid Plexus:        Appears normal         Bladder:                Appears normal
Heart:                 Appears normal         Upper Extremities:      Visualized
(4CH, axis, and situs
Stomach:               Appears normal, left   Lower Extremities:      Visualized
sided
Cervix Uterus Adnexa

Cervix
Normal appearance by transabdominal scan.

Uterus
No abnormality visualized.

Left Ovary
No adnexal mass visualized.

Right Ovary
Within normal limits.

Cul De Sac:   No free fluid seen.

Adnexa:       No abnormality visualized.
Impression

SIUP at 13+0 weeks
No gross abnormalities identified
NT measurement was within normal limits for this GA; NB
present
Normal amniotic fluid volume
Measurements consistent with LMP dating
Recommendations

Offer MSAFP in the second trimester for ONTD screening
Offer anatomy U/S by 18 weeks

## 2018-03-22 ENCOUNTER — Ambulatory Visit: Payer: 59

## 2018-03-24 ENCOUNTER — Ambulatory Visit: Payer: 59

## 2018-03-26 ENCOUNTER — Other Ambulatory Visit: Payer: Self-pay | Admitting: Obstetrics

## 2018-03-29 ENCOUNTER — Ambulatory Visit (INDEPENDENT_AMBULATORY_CARE_PROVIDER_SITE_OTHER): Payer: 59

## 2018-03-29 DIAGNOSIS — Z3042 Encounter for surveillance of injectable contraceptive: Secondary | ICD-10-CM | POA: Diagnosis not present

## 2018-03-29 NOTE — Progress Notes (Addendum)
Nurse visit for Depo. Pt is within her window. Depo given L Del w/o complaints. Next inj. due 2/10-2/24.  Pt needs an annual. She will schedule today at checkout.

## 2018-04-01 NOTE — Progress Notes (Signed)
I have reviewed the chart and agree with nursing staff's documentation of this patient's encounter.  Catalina AntiguaPeggy Hailea Eaglin, MD 04/01/2018 5:42 PM

## 2018-04-07 ENCOUNTER — Encounter: Payer: Self-pay | Admitting: Emergency Medicine

## 2018-04-07 ENCOUNTER — Other Ambulatory Visit: Payer: Self-pay

## 2018-04-07 ENCOUNTER — Ambulatory Visit: Payer: Self-pay | Admitting: Emergency Medicine

## 2018-04-07 VITALS — BP 104/69 | HR 80 | Temp 97.7°F | Resp 16 | Ht 68.5 in | Wt 252.6 lb

## 2018-04-07 DIAGNOSIS — J029 Acute pharyngitis, unspecified: Secondary | ICD-10-CM

## 2018-04-07 LAB — POCT RAPID STREP A (OFFICE): RAPID STREP A SCREEN: NEGATIVE

## 2018-04-07 MED ORDER — CEFADROXIL 500 MG/5ML PO SUSR: 500.0000 mg | Freq: Two times a day (BID) | ORAL | 0 refills | Status: AC

## 2018-04-07 NOTE — Addendum Note (Signed)
Addended by: Evie LacksSAGARDIA, Jacob Chamblee J on: 04/07/2018 09:01 AM   Modules accepted: Level of Service

## 2018-04-07 NOTE — Progress Notes (Signed)
Jamie [name] 27 y.o.   Chief Complaint  Patient presents with  . Epistaxis    per patient started this morning  . Sore Throat    per patient started yesterday with left ear pain    HISTORY OF PRESENT ILLNESS: This is a 27 y.o. female complaining of sore throat since yesterday.  Today had nasal congestion with episode of epistaxis that lasted 1 hour.  Also had left ear pain.  No other significant symptoms.  Household members also sick with similar symptoms.  Mother and 2 each.  They have all been on antibiotics in the past 2 to 3 weeks.  Sore Throat   This is a new problem. The current episode started yesterday. The problem has been gradually worsening. The pain is worse on the left side. There has been no fever. The pain is at a severity of 3/10. The pain is mild. Associated symptoms include ear pain and swollen glands. Pertinent negatives include no abdominal pain, congestion, coughing, diarrhea, ear discharge, headaches, neck pain, shortness of breath, trouble swallowing or vomiting. She has had exposure to strep. She has tried nothing for the symptoms.     Prior to Admission medications   Medication Sig Start Date End Date Taking? Authorizing Provider  albuterol (PROVENTIL HFA;VENTOLIN HFA) 108 (90 Base) MCG/ACT inhaler Inhale 1-2 puffs into the lungs every 6 (six) hours as needed for wheezing or shortness of breath. 01/30/18  Yes Ofilia Neas, PA-C  ibuprofen (ADVIL,MOTRIN) 600 MG tablet Take 1 tablet (600 mg total) by mouth every 6 (six) hours as needed. 01/30/18  Yes Ofilia Neas, PA-C  medroxyPROGESTERone (DEPO-PROVERA) 150 MG/ML injection Inject 1 mL (150 mg total) into the muscle every 3 (three) months. 05/01/17  Yes Brock Bad, MD  cefUROXime (CEFTIN) 250 MG/5ML suspension Take 10 mLs (500 mg total) by mouth 2 (two) times daily. Patient not taking: Reported on 04/07/2018 09/10/17   Brock Bad, MD  Elastic Bandages & Supports (COMFORT FIT MATERNITY SUPP LG) MISC 1  Units by Does not apply route daily. Patient not taking: Reported on 04/07/2018 10/23/16   Orvilla Cornwall A, CNM  medroxyPROGESTERone Acetate 150 MG/ML SUSY INJECT (150MG ) INTO THE MUSCLE EVERY 3 MONTHS 03/27/18   Brock Bad, MD  metroNIDAZOLE (FLAGYL) 500 MG tablet Take 1 tablet (500 mg total) by mouth 2 (two) times daily. Patient not taking: Reported on 04/07/2018 09/12/17   Brock Bad, MD  Prenatal MV-Min-FA-Omega-3 (PRENATAL GUMMIES/DHA & FA) 0.4-32.5 MG CHEW 1 chew by mouth daily    [provider]  Prenatal-DSS-FeCb-FeGl-FA (CITRANATAL BLOOM) 90-1 MG TABS Take 1 tablet by mouth daily. Patient not taking: Reported on 04/07/2018 10/10/16   Orvilla Cornwall A, CNM  terconazole (TERAZOL 3) 0.8 % vaginal cream Place 1 applicator vaginally at bedtime. Patient not taking: Reported on 04/07/2018 09/10/17   Brock Bad, MD    Allergies  Allergen Reactions  . Shellfish Allergy Swelling    Throat     There are no active problems to display for this patient.   No past medical history on file.  Past Surgical History:  Procedure Laterality Date  . NO PAST SURGERIES      Social History   Socioeconomic History  . Marital status: Single    Spouse name: Not on file  . Number of children: Not on file  . Years of education: Not on file  . Highest education level: Not on file  Occupational History  . Not on  file  Social Needs  . Financial resource strain: Not on file  . Food insecurity:    Worry: Not on file    Inability: Not on file  . Transportation needs:    Medical: Not on file    Non-medical: Not on file  Tobacco Use  . Smoking status: Never Smoker  . Smokeless tobacco: Never Used  Substance and Sexual Activity  . Alcohol use: No  . Drug use: No  . Sexual activity: Yes    Partners: Male    Birth control/protection: Injection  Lifestyle  . Physical activity:    Days per week: Not on file    Minutes per session: Not on file  . Stress: Not on  file  Relationships  . Social connections:    Talks on phone: Not on file    Gets together: Not on file    Attends religious service: Not on file    Active member of club or organization: Not on file    Attends meetings of clubs or organizations: Not on file    Relationship status: Not on file  . Intimate partner violence:    Fear of current or ex partner: Not on file    Emotionally abused: Not on file    Physically abused: Not on file    Forced sexual activity: Not on file  Other Topics Concern  . Not on file  Social History Narrative  . Not on file    Family History  Problem Relation Age of Onset  . Heart murmur Mother   . Diabetes Father   . Stroke Father   . Heart disease Father   . Heart failure Father   . Diabetes Maternal Grandfather   . Heart disease Maternal Grandfather   . Hypertension Maternal Grandfather   . Heart attack Paternal Grandmother   . Arrhythmia Brother   . Healthy Brother      Review of Systems  Constitutional: Negative.  Negative for chills and fever.  HENT: Positive for ear pain and sore throat. Negative for congestion, ear discharge and trouble swallowing.   Eyes: Negative.  Negative for discharge and redness.  Respiratory: Negative.  Negative for cough, shortness of breath and wheezing.   Cardiovascular: Negative.  Negative for chest pain and palpitations.  Gastrointestinal: Negative.  Negative for abdominal pain, diarrhea and vomiting.  Genitourinary: Negative.   Musculoskeletal: Negative for back pain, joint pain, myalgias and neck pain.  Skin: Negative for rash.  Neurological: Negative.  Negative for dizziness and headaches.  Endo/Heme/Allergies: Negative.   All other systems reviewed and are negative.   Vitals:   04/07/18 0815  BP: 104/69  Pulse: 80  Resp: 16  Temp: 97.7 F (36.5 C)  SpO2: 98%    Physical Exam  Constitutional: She is oriented to person, place, and time. She appears well-developed and well-nourished.  HENT:   Head: Normocephalic and atraumatic.  Right Ear: Tympanic membrane, external ear and ear canal normal.  Left Ear: Tympanic membrane, external ear and ear canal normal.  Nose: Nose normal.  Mouth/Throat: Uvula is midline. No uvula swelling. Posterior oropharyngeal erythema present. No oropharyngeal exudate or posterior oropharyngeal edema.  Eyes: Pupils are equal, round, and reactive to light. Conjunctivae and EOM are normal.  Neck: No JVD present. No thyromegaly present.  Cardiovascular: Normal rate, regular rhythm and normal heart sounds.  Pulmonary/Chest: Effort normal and breath sounds normal.  Abdominal: Soft. She exhibits no distension. There is no tenderness.  Musculoskeletal: Normal range of motion.  Lymphadenopathy:    She has cervical adenopathy.  Neurological: She is alert and oriented to person, place, and time. No sensory deficit. She exhibits normal muscle tone. Coordination normal.  Skin: Skin is warm and dry. Capillary refill takes less than 2 seconds.  Psychiatric: She has a normal mood and affect. Her behavior is normal.  Vitals reviewed.    ASSESSMENT & PLAN: Jamie [name] was seen today for epistaxis and sore throat.  Diagnoses and all orders for this visit:  Sore throat -     POCT rapid strep A -     Culture, Group A Strep  Acute pharyngitis, unspecified etiology -     cefadroxil (DURICEF) 500 MG/5ML suspension; Take 5 mLs (500 mg total) by mouth 2 (two) times daily for 7 days.    Patient Instructions       If you have lab work done today you will be contacted with your lab results within the next 2 weeks.  If you have not heard from [place] then please contact us. The fastest way to get your results is to register for My Chart.   IF you received an x-ray today, you will receive an invoice from [facility]. Please contact [facility] at [phone] with questions or concerns regarding your invoice.   IF you received labwork today, you will  receive an invoice from [facility]. Please contact [facility] at [phone] with questions or concerns regarding your invoice.   Our billing staff will not be able to assist you with questions regarding bills from these companies.  You will be contacted with the lab results as soon as they are available. The fastest way to get your results is to activate your My Chart account. Instructions are located on the last page of this paperwork. If you have not heard from [place] regarding the results in 2 weeks, please contact this office.     Sore Throat When you have a sore throat, your throat may:  Hurt.  Burn.  Feel irritated.  Feel scratchy.  Many things can cause a sore throat, including:  An infection.  Allergies.  Dryness in the air.  Smoke or pollution.  Gastroesophageal reflux disease (GERD).  A tumor.  A sore throat can be the first sign of another sickness. It can happen with other problems, like coughing or a fever. Most sore throats go away without treatment. Follow these instructions at home:  Take over-the-counter medicines only as told by your doctor.  Drink enough fluids to keep your pee (urine) clear or pale yellow.  Rest when you feel you need to.  To help with pain, try: ? Sipping warm liquids, such as broth, herbal tea, or warm water. ? Eating or drinking cold or frozen liquids, such as frozen ice pops. ? Gargling with a salt-water mixture 3-4 times a day or as needed. To make a salt-water mixture, add -1 tsp of salt in 1 cup of warm water. Mix it until you cannot see the salt anymore. ? Sucking on hard candy or throat lozenges. ? Putting a cool-mist humidifier in your bedroom at night. ? Sitting in the bathroom with the door closed for 5-10 minutes while you run hot water in the shower.  Do not use any tobacco products, such as cigarettes, chewing tobacco, and e-cigarettes. If you need help quitting, ask your doctor. Contact a doctor if:  You have a fever  for more than 2-3 days.  You keep having symptoms for more than 2-3 days.  Your throat  does not get better in 7 days.  You have a fever and your symptoms suddenly get worse. Get help right away if:  You have trouble breathing.  You cannot swallow fluids, soft foods, or your saliva.  You have swelling in your throat or neck that gets worse.  You keep feeling like you are going to throw up (vomit).  You keep throwing up. This information is not intended to replace advice given to you by your health care provider. Make sure you discuss any questions you have with your health care provider. Document Released: 01/29/2008 Document Revised: 12/16/2015 Document Reviewed: 02/09/2015 Elsevier Interactive Patient Education  2018 Elsevier Inc.      Edwina Barth, MD [facility]

## 2018-04-07 NOTE — Patient Instructions (Addendum)
     If you have lab work done today you will be contacted with your lab results within the next 2 weeks.  If you have not heard from us then please contact us. The fastest way to get your results is to register for My Chart.   IF you received an x-ray today, you will receive an invoice from [facility]. Please contact [facility] at [phone] with questions or concerns regarding your invoice.   IF you received labwork today, you will receive an invoice from [facility]. Please contact [facility] at [phone] with questions or concerns regarding your invoice.   Our billing staff will not be able to assist you with questions regarding bills from these companies.  You will be contacted with the lab results as soon as they are available. The fastest way to get your results is to activate your My Chart account. Instructions are located on the last page of this paperwork. If you have not heard from us regarding the results in 2 weeks, please contact this office.     Sore Throat When you have a sore throat, your throat may:  Hurt.  Burn.  Feel irritated.  Feel scratchy.  Many things can cause a sore throat, including:  An infection.  Allergies.  Dryness in the air.  Smoke or pollution.  Gastroesophageal reflux disease (GERD).  A tumor.  A sore throat can be the first sign of another sickness. It can happen with other problems, like coughing or a fever. Most sore throats go away without treatment. Follow these instructions at home:  Take over-the-counter medicines only as told by your doctor.  Drink enough fluids to keep your pee (urine) clear or pale yellow.  Rest when you feel you need to.  To help with pain, try: ? Sipping warm liquids, such as broth, herbal tea, or warm water. ? Eating or drinking cold or frozen liquids, such as frozen ice pops. ? Gargling with a salt-water mixture 3-4 times a day or as needed. To make a salt-water mixture, add -1  tsp of salt in 1 cup of warm water. Mix it until you cannot see the salt anymore. ? Sucking on hard candy or throat lozenges. ? Putting a cool-mist humidifier in your bedroom at night. ? Sitting in the bathroom with the door closed for 5-10 minutes while you run hot water in the shower.  Do not use any tobacco products, such as cigarettes, chewing tobacco, and e-cigarettes. If you need help quitting, ask your doctor. Contact a doctor if:  You have a fever for more than 2-3 days.  You keep having symptoms for more than 2-3 days.  Your throat does not get better in 7 days.  You have a fever and your symptoms suddenly get worse. Get help right away if:  You have trouble breathing.  You cannot swallow fluids, soft foods, or your saliva.  You have swelling in your throat or neck that gets worse.  You keep feeling like you are going to throw up (vomit).  You keep throwing up. This information is not intended to replace advice given to you by your health care provider. Make sure you discuss any questions you have with your health care provider. Document Released: 01/29/2008 Document Revised: 12/16/2015 Document Reviewed: 02/09/2015 Elsevier Interactive Patient Education  2018 Elsevier Inc.  

## 2018-04-10 LAB — CULTURE, GROUP A STREP: Strep A Culture: NEGATIVE

## 2018-04-12 ENCOUNTER — Encounter: Payer: Self-pay | Admitting: *Deleted

## 2018-05-04 ENCOUNTER — Encounter: Payer: Self-pay | Admitting: Obstetrics and Gynecology

## 2018-05-04 ENCOUNTER — Ambulatory Visit (INDEPENDENT_AMBULATORY_CARE_PROVIDER_SITE_OTHER): Payer: POS | Admitting: Obstetrics and Gynecology

## 2018-05-04 DIAGNOSIS — N941 Unspecified dyspareunia: Secondary | ICD-10-CM

## 2018-05-04 DIAGNOSIS — Z01419 Encounter for gynecological examination (general) (routine) without abnormal findings: Secondary | ICD-10-CM | POA: Diagnosis not present

## 2018-05-04 DIAGNOSIS — N898 Other specified noninflammatory disorders of vagina: Secondary | ICD-10-CM | POA: Diagnosis not present

## 2018-05-04 DIAGNOSIS — Z124 Encounter for screening for malignant neoplasm of cervix: Secondary | ICD-10-CM | POA: Diagnosis not present

## 2018-05-04 NOTE — Progress Notes (Signed)
Subjective:     Jamie [name] is a 27 y.o. female P2 who is here for a comprehensive physical exam. The patient reports intermittent RUQ pain for the past few days and dyspareunia since delivery of her son a year ago. She describes the pain as a burning sensation with the insertion. She denies vaginal dryness. She is using depo-provera for contraception. She reports RUQ pain after the consumption of certain foods. She describes it as colicky in nature. She denies nausea or emesis. Patient is without other complaints  History reviewed. No pertinent past medical history. Past Surgical History:  Procedure Laterality Date  . NO PAST SURGERIES     Family History  Problem Relation Age of Onset  . Heart murmur Mother   . Hypertension Mother   . Diabetes Father   . Stroke Father   . Heart disease Father   . Heart failure Father   . Diabetes Maternal Grandfather   . Heart disease Maternal Grandfather   . Hypertension Maternal Grandfather   . Heart attack Paternal Grandmother   . Arrhythmia Brother   . Healthy Brother     Social History   Socioeconomic History  . Marital status: Single    Spouse name: Not on file  . Number of children: Not on file  . Years of education: Not on file  . Highest education level: Not on file  Occupational History  . Not on file  Social Needs  . Financial resource strain: Not on file  . Food insecurity:    Worry: Not on file    Inability: Not on file  . Transportation needs:    Medical: Not on file    Non-medical: Not on file  Tobacco Use  . Smoking status: Never Smoker  . Smokeless tobacco: Never Used  Substance and Sexual Activity  . Alcohol use: No  . Drug use: No  . Sexual activity: Yes    Partners: Male    Birth control/protection: Injection  Lifestyle  . Physical activity:    Days per week: Not on file    Minutes per session: Not on file  . Stress: Not on file  Relationships  . Social connections:    Talks on phone: Not on file   Gets together: Not on file    Attends religious service: Not on file    Active member of club or organization: Not on file    Attends meetings of clubs or organizations: Not on file    Relationship status: Not on file  . Intimate partner violence:    Fear of current or ex partner: Not on file    Emotionally abused: Not on file    Physically abused: Not on file    Forced sexual activity: Not on file  Other Topics Concern  . Not on file  Social History Narrative  . Not on file   Health Maintenance  Topic Date Due  . INFLUENZA VACCINE  08/03/2018 (Originally 12/03/2017)  . PAP-Cervical Cytology Screening  02/04/2020  . PAP SMEAR-Modifier  02/04/2020  . TETANUS/TDAP  01/07/2027  . HIV Screening  Completed       Review of Systems Pertinent items are noted in HPI.   Objective:  BP 111/74  P 98  Wt 256 lb     GENERAL: Well-developed, well-nourished female in no acute distress.  HEENT: Normocephalic, atraumatic. Sclerae anicteric.  NECK: Supple. Normal thyroid.  LUNGS: Clear to auscultation bilaterally.  HEART: Regular rate and rhythm. BREASTS: Symmetric in size. No palpable  masses or lymphadenopathy, skin changes, or nipple drainage. ABDOMEN: Soft, nontender, nondistended. No organomegaly. PELVIC: Normal external female genitalia. Vagina is pink and rugated.  Point tenderness at 6 o'clock at vaginal introitus. Normal discharge. Normal appearing cervix. Uterus is normal in size. No adnexal mass or tenderness. EXTREMITIES: No cyanosis, clubbing, or edema, 2+ distal pulses.    Assessment:    Healthy female exam.      Plan:    pap smear collected Wet prep collected as patient reports recurrent BV infection Referral to pelvic pain clinic if vaginal swab is negative Discusses gallbladder as possible etiology of RUQ pain. Discussed diet modification. Patient to follow up with PCP if pain persists for gen surgery referral Patient will be contacted with abnormal results See After  Visit Summary for Counseling Recommendations

## 2018-05-04 NOTE — Patient Instructions (Signed)
Bacterial Vaginosis    Bacterial vaginosis is a vaginal infection that occurs when the normal balance of bacteria in the vagina is disrupted. It results from an overgrowth of certain bacteria. This is the most common vaginal infection among women ages 15-44.  Because bacterial vaginosis increases your risk for STIs (sexually transmitted infections), getting treated can help reduce your risk for chlamydia, gonorrhea, herpes, and HIV (human immunodeficiency virus). Treatment is also important for preventing complications in pregnant women, because this condition can cause an early (premature) delivery.  What are the causes?  This condition is caused by an increase in harmful bacteria that are normally present in small amounts in the vagina. However, the reason that the condition develops is not fully understood.  What increases the risk?  The following factors may make you more likely to develop this condition:  · Having a new sexual partner or multiple sexual partners.  · Having unprotected sex.  · Douching.  · Having an intrauterine device ([name]).  · Smoking.  · Drug and alcohol abuse.  · Taking certain antibiotic medicines.  · Being pregnant.  You cannot get bacterial vaginosis from toilet seats, bedding, swimming pools, or contact with objects around you.  What are the signs or symptoms?  Symptoms of this condition include:  · Grey or white vaginal discharge. The discharge can also be watery or foamy.  · A fish-like odor with discharge, especially after sexual intercourse or during menstruation.  · Itching in and around the vagina.  · Burning or pain with urination.  Some women with bacterial vaginosis have no signs or symptoms.  How is this diagnosed?  This condition is diagnosed based on:  · Your medical history.  · A physical exam of the vagina.  · Testing a sample of vaginal fluid under a microscope to look for a large amount of bad bacteria or abnormal cells. Your health care provider may use a cotton swab or  a small wooden spatula to collect the sample.  How is this treated?  This condition is treated with antibiotics. These may be given as a pill, a vaginal cream, or a medicine that is put into the vagina (suppository). If the condition comes back after treatment, a second round of antibiotics may be needed.  Follow these instructions at home:  Medicines  · Take over-the-counter and prescription medicines only as told by your health care provider.  · Take or use your antibiotic as told by your health care provider. Do not stop taking or using the antibiotic even if you start to feel better.  General instructions  · If you have a female sexual partner, tell her that you have a vaginal infection. She should see her health care provider and be treated if she has symptoms. If you have a female sexual partner, he does not need treatment.  · During treatment:  ? Avoid sexual activity until you finish treatment.  ? Do not douche.  ? Avoid alcohol as directed by your health care provider.  ? Avoid breastfeeding as directed by your health care provider.  · Drink enough water and fluids to keep your urine clear or pale yellow.  · Keep the area around your vagina and rectum clean.  ? Wash the area daily with warm water.  ? Wipe yourself from front to back after using the toilet.  · Keep all follow-up visits as told by your health care provider. This is important.  How is this prevented?  · Do not   douche.  · Wash the outside of your vagina with warm water only.  · Use protection when having sex. This includes latex condoms and dental dams.  · Limit how many sexual partners you have. To help prevent bacterial vaginosis, it is best to have sex with just one partner (monogamous).  · Make sure you and your sexual partner are tested for STIs.  · Wear cotton or cotton-lined underwear.  · Avoid wearing tight pants and pantyhose, especially during summer.  · Limit the amount of alcohol that you drink.  · Do not use any products that contain  nicotine or tobacco, such as cigarettes and e-cigarettes. If you need help quitting, ask your health care provider.  · Do not use illegal drugs.  Where to find more information  · Centers for Disease Control and Prevention: www.cdc.gov/std  · American Sexual Health Association (ASHA): www.ashastd.org  · U.S. Department of Health and Human Services, Office on Women's Health: www.womenshealth.gov/ or https://www.womenshealth.gov/a-z-topics/bacterial-vaginosis  Contact a health care provider if:  · Your symptoms do not improve, even after treatment.  · You have more discharge or pain when urinating.  · You have a fever.  · You have pain in your abdomen.  · You have pain during sex.  · You have vaginal bleeding between periods.  Summary  · Bacterial vaginosis is a vaginal infection that occurs when the normal balance of bacteria in the vagina is disrupted.  · Because bacterial vaginosis increases your risk for STIs (sexually transmitted infections), getting treated can help reduce your risk for chlamydia, gonorrhea, herpes, and HIV (human immunodeficiency virus). Treatment is also important for preventing complications in pregnant women, because the condition can cause an early (premature) delivery.  · This condition is treated with antibiotic medicines. These may be given as a pill, a vaginal cream, or a medicine that is put into the vagina (suppository).  This information is not intended to replace advice given to you by your health care provider. Make sure you discuss any questions you have with your health care provider.  Document Released: 04/21/2005 Document Revised: 08/25/2016 Document Reviewed: 01/05/2016  Elsevier Interactive Patient Education © 2019 Elsevier Inc.

## 2018-05-04 NOTE — Progress Notes (Signed)
Patient presents for Annual Exam today.   CC: Right sided upper abdominal pain.  8/10x pt describes pain as a "charlie horse" sometimes. Pt also notes pain w/ intercourse at the start of intercourse pt states this has started since she had her baby.  LMP: unsure to to contraception Depo Injections Contraception: Depo STD : Declines Last pap: 02/03/2017 WNL

## 2018-05-06 LAB — CYTOLOGY - PAP: Diagnosis: NEGATIVE

## 2018-05-06 LAB — CERVICOVAGINAL ANCILLARY ONLY
Bacterial vaginitis: NEGATIVE
Candida vaginitis: NEGATIVE

## 2018-06-04 ENCOUNTER — Ambulatory Visit: Payer: POS | Admitting: Family Medicine

## 2018-06-08 ENCOUNTER — Ambulatory Visit: Payer: POS

## 2018-06-09 NOTE — Progress Notes (Signed)
Disregard

## 2018-06-15 ENCOUNTER — Ambulatory Visit (INDEPENDENT_AMBULATORY_CARE_PROVIDER_SITE_OTHER): Payer: POS

## 2018-06-15 DIAGNOSIS — Z3042 Encounter for surveillance of injectable contraceptive: Secondary | ICD-10-CM

## 2018-06-15 NOTE — Progress Notes (Signed)
Patient is in the office for depo, administered in R arm, and pt tolerated well .Marland Kitchen Administrations This Visit    medroxyPROGESTERone (DEPO-PROVERA) injection 150 mg    Admin Date 06/15/2018 Action Given Dose 150 mg Route Intramuscular Administered By Katrina Stack, RN

## 2018-06-15 NOTE — Progress Notes (Signed)
Patient ID: Jamie [name], female   DOB: 1990/12/10, 28 y.o.   MRN: [id number] I have reviewed the chart and agree with nursing staff's documentation of this patient's encounter.  Scheryl Darter, MD 06/15/2018 4:28 PM

## 2018-09-01 ENCOUNTER — Other Ambulatory Visit: Payer: Self-pay

## 2018-09-01 ENCOUNTER — Ambulatory Visit (INDEPENDENT_AMBULATORY_CARE_PROVIDER_SITE_OTHER): Payer: POS

## 2018-09-01 VITALS — BP 122/74 | HR 81 | Ht 70.0 in | Wt 265.3 lb

## 2018-09-01 DIAGNOSIS — Z3042 Encounter for surveillance of injectable contraceptive: Secondary | ICD-10-CM | POA: Diagnosis not present

## 2018-09-01 MED ORDER — MEDROXYPROGESTERONE ACETATE 150 MG/ML IM SUSP
150.0000 mg | Freq: Once | INTRAMUSCULAR | Status: AC
  Administered 2018-09-01: 150 mg via INTRAMUSCULAR

## 2018-09-01 NOTE — Progress Notes (Signed)
Presents for DEPO, given in LD, tolerated well.  Next DEPO July 15-29/2020  Administrations This Visit    medroxyPROGESTERone (DEPO-PROVERA) injection 150 mg    Admin Date 09/01/2018 Action Given Dose 150 mg Route Intramuscular Administered By Maretta Bees, RMA

## 2018-11-23 ENCOUNTER — Ambulatory Visit (INDEPENDENT_AMBULATORY_CARE_PROVIDER_SITE_OTHER): Payer: POS

## 2018-11-23 ENCOUNTER — Other Ambulatory Visit: Payer: Self-pay

## 2018-11-23 DIAGNOSIS — Z3042 Encounter for surveillance of injectable contraceptive: Secondary | ICD-10-CM | POA: Diagnosis not present

## 2018-11-23 MED ORDER — MEDROXYPROGESTERONE ACETATE 150 MG/ML IM SUSP
150.0000 mg | Freq: Once | INTRAMUSCULAR | Status: AC
  Administered 2018-11-23: 150 mg via INTRAMUSCULAR

## 2018-11-23 MED ORDER — MEDROXYPROGESTERONE ACETATE 150 MG/ML IM SUSP: 150.0000 mg | INTRAMUSCULAR | 0 refills | Status: DC

## 2018-11-23 NOTE — Progress Notes (Signed)
Nurse visit for pt's supply Depo. Pt is on time for Depo Depo given L Del w/o difficulty  Next Depo due 10/6-10/20, pt agrees  Pt needs another Depo rx prior to next scheduled inj sent to [facility]

## 2018-11-24 NOTE — Progress Notes (Signed)
Patient seen and assessed by nursing staff during this encounter. I have reviewed the chart and agree with the documentation and plan.  Mora Bellman, MD 11/24/2018 10:37 AM

## 2019-02-15 ENCOUNTER — Ambulatory Visit (INDEPENDENT_AMBULATORY_CARE_PROVIDER_SITE_OTHER): Payer: POS

## 2019-02-15 ENCOUNTER — Other Ambulatory Visit: Payer: Self-pay

## 2019-02-15 DIAGNOSIS — Z3042 Encounter for surveillance of injectable contraceptive: Secondary | ICD-10-CM | POA: Diagnosis not present

## 2019-02-15 NOTE — Progress Notes (Signed)
Nurse visit for pt supply Depo  Pt is within her window. Depo given LD w/o complaints Next Depo due 12/29-01/12, pt agrees

## 2019-02-15 NOTE — Progress Notes (Signed)
Patient seen and assessed by nursing staff during this encounter. I have reviewed the chart and agree with the documentation and plan.  Chalonda Schlatter, MD 02/15/2019 1:53 PM    

## 2019-05-09 ENCOUNTER — Other Ambulatory Visit: Payer: Self-pay | Admitting: Obstetrics and Gynecology

## 2019-05-10 ENCOUNTER — Other Ambulatory Visit: Payer: Self-pay

## 2019-05-10 ENCOUNTER — Ambulatory Visit (INDEPENDENT_AMBULATORY_CARE_PROVIDER_SITE_OTHER): Payer: POS | Admitting: Advanced Practice Midwife

## 2019-05-10 ENCOUNTER — Encounter: Payer: Self-pay | Admitting: Advanced Practice Midwife

## 2019-05-10 VITALS — BP 118/81 | HR 90 | Wt 272.0 lb

## 2019-05-10 DIAGNOSIS — Z3009 Encounter for other general counseling and advice on contraception: Secondary | ICD-10-CM

## 2019-05-10 DIAGNOSIS — Z01419 Encounter for gynecological examination (general) (routine) without abnormal findings: Secondary | ICD-10-CM | POA: Diagnosis not present

## 2019-05-10 MED ORDER — MEDROXYPROGESTERONE ACETATE 150 MG/ML IM SUSP: 150.0000 mg | INTRAMUSCULAR | 4 refills | Status: DC

## 2019-05-10 MED ORDER — MEDROXYPROGESTERONE ACETATE 150 MG/ML IM SUSP
150.0000 mg | Freq: Once | INTRAMUSCULAR | Status: AC
  Administered 2019-05-10: 150 mg via INTRAMUSCULAR

## 2019-05-10 MED ORDER — MEDROXYPROGESTERONE ACETATE 150 MG/ML IM SUSP: 150.0000 mg | INTRAMUSCULAR | 0 refills | Status: DC

## 2019-05-10 NOTE — Progress Notes (Signed)
Subjective:     Jamie [name] is a 29 y.o. female here at [facility] for a routine exam.  Current complaints: None except weight gain with Depo.  Personal health questionnaire reviewed: yes.  Do you have a primary care provider? No, resources provided How many times per week do you exercise? None but active at work      Office Visit [facility]/10/2019 [facility]  PHQ-2 Total Score  0     Gynecologic History No LMP recorded. Patient has had an injection. Contraception: Depo-Provera injections Last Pap: 05/04/2018. Results were: normal Last mammogram: n/a  Obstetric History OB History  Gravida Para Term Preterm AB Living  2 2 2     2   SAB TAB Ectopic Multiple Live Births        0 2    # Outcome Date GA Lbr Len/2nd Weight Sex Delivery Anes PTL Lv  2 Term 01/05/17 [redacted]w[redacted]d 06:25 / 00:03 8 lb 13.8 oz (4.02 kg) M Vag-Spont Local  LIV  1 Term 05/22/13 [redacted]w[redacted]d 09:35 / 00:18 7 lb 4.8 oz (3.311 kg) M Vag-Spont None  LIV     The following portions of the patient's history were reviewed and updated as appropriate: allergies, current medications, past family history, past medical history, past social history, past surgical history and problem list.  Review of Systems Pertinent items noted in HPI and remainder of comprehensive ROS otherwise negative.    Objective:   BP 118/81   Pulse 90   Wt 272 lb (123.4 kg)   BMI 39.03 kg/m    VS reviewed, nursing note reviewed,  Constitutional: well developed, well nourished, no distress HEENT: normocephalic CV: normal rate Pulm/chest wall: normal effort Breast Exam:  right breast normal without mass, skin or nipple changes or axillary nodes, left breast normal without mass, skin or nipple changes or axillary nodes Abdomen: soft Neuro: alert and oriented x 3 Skin: warm, dry Psych: affect normal Pelvic exam: Deferred     Assessment/Plan:   1. Encounter for well woman exam with routine gynecological exam --No Gyn  concerns or complaints.  Had 1 light period this week, none in last 2 years.    --Some fatigue with long working hours, 2 small children.  Will check TSH, CBC today.  Pt to follow up with PCP if persists. - CBC - TSH  2. Encounter for counseling regarding contraception --Weight gain with Depo, considering switching. Had heavy irregular periods with Mirena but it was out of place so was removed.  Discussed trying [name] again. Pt undecided. [redacted]w[redacted]d about LARCs given.  Today's Depo injection given by nurse. - medroxyPROGESTERone (DEPO-PROVERA) injection 150 mg - medroxyPROGESTERone (DEPO-PROVERA) 150 MG/ML injection; Inject 1 mL (150 mg total) into the muscle every 3 (three) months.  Dispense: 1 mL; Refill: 4   Follow up in: 3 months or as needed.   Programme researcher, broadcasting/film/video, CNM 10:38 AM

## 2019-05-10 NOTE — Patient Instructions (Signed)
No Primary Care Doctor:  To locate a primary care doctor that accepts your insurance or provides certain services:           [place] [facility]: [phone]           Physician Referral Service: [phone] ask for "My " . If no insurance, you need to see if you qualify for [facility] "orange card", call to set      up appointment for eligibility/enrollment at [phone] or [phone] or visit [facility] of Health and [employer] ([address], [place] and [address] [place]) to meet with a [facility] enrollment specialist.  Agencies that provide inexpensive (sliding fee scale) medical care:   Marland Kitchen    [facility] - Family Medicine at [facility] .    [facility]  -  [facility] [phone] .    [facility] - [phone] .    [facility] [phone] .    [facility] [phone] .    [facility] [phone] . [facility] - [facility] [place] [phone]-     [phone] . [facility] [place] [facility] [place] [phone] . [facility]: [phone]  . Women[facility] Clinic: [phone]  . [facility]: [phone]  . [facility] Services of the [place]    Medicaid-accepting [facility] Providers:           [facility] - [phone] (No Family Planning accepted)          [address], [phone], Mon-Fri 9am-5pm          [facility] [phone] . [address] [place], [address], [place] 8am-5pm, Fri 8am-noon . [facility] - [phone]          [address], Mon-Fri 7:30am-4:30pm          [facility] - [phone]          [address], [place] 8am-5pm          [facility] - [phone] [address], [address]          Only accepts [place] [employer] patients after they have their name applied to their card  Self Pay (no insurance) in [facility]:           Sickle Cell Patients:  . [address] [place], [phone] [facility] Internal Medicine: . [address] [place] [phone]       [facility] and Wellness . [address], [place] [phone]  [facility]: . [address] [phone]          [facility]           [address] [place], [phone] [facility] . [address] [place], [phone]           [facility] [place]           [address], [place] [phone]        [facility] - [address]           [phone], Mon-Fri 9am-7pm, Sat 9am-1pm          [facility] @ [place]          [address], [phone]          [facility] [place]           [address], [phone] [facility] [place] . [address], [place] [phone]          [facility]           [address] [place] [address], [phone]  [facility]  . [address], [phone] [facility] . [address], [phone]  Dr. Osei-Bonsu           [address], [place], [phone]          [facility]           [address], [phone]          [facility]             [address], [phone]          [facility]           [address], [phone], 1st & 3rd Saturday every month, 10am-1pm  OTHERS:  [employer]  (Immigration Access Clinic Only)  [phone] (Thursday only)  Strategies  for finding a Primary Care Provider:  1) Find a Doctor and Pay Out of Pocket  Although you won't have to find out who is covered by your insurance plan, it is a good idea to ask around and get recommendations. You will then need to call the office and see if the doctor you have chosen will accept you as a new patient and what types of options they offer for patients who are self-pay. Some doctors offer discounts or will set up payment plans for their patients who do not have insurance, but you will need to ask so you aren't surprised when you get to your appointment.  2) Contact [facility] - To see if you qualify for "orange card" access to healthcare safety net providers.  Call for appointment for eligibility/enrollment at [phone] or [phone]. (Uninsured, 0-200% FPL, qualifying info)  Applicants for [facility] are first required to see if they are eligible to enroll in the ACA Marketplace before enrolling in [facility] (and get an exemption if they are not).  [facility] Criteria for acceptance is:  ? Proof of ACA Marketing exemption - form or documentation  ? Valid photo ID (driver's license, state identification card, passport, home country ID)  ? Proof of [place] residency (e.g. driver's license, lease/landlord information, pay stubs with address, utility bill, bank statement, etc.)  ? Proof of income (1040, last year's tax return, W2, 4 current pay stubs, other income proof)  ? Proof of assets (current bank statement + 3 most recent, disability paperwork, life insurance info, tax value on autos, etc.)  3) Contact Your Local Health Department  Not all health departments have doctors that can see patients for sick visits, but many do, so it is worth a call to see if yours does. If you don't   know where your local health department is, you can check in your phone book. The CDC also has a tool to help you locate your state's health department, and many state websites also have  listings of all of their local health departments.  4) Find a [facility]  If your illness is not likely to be very severe or complicated, you may want to try a walk in clinic. These are popping up all over the country in pharmacies, drugstores, and shopping centers. They're usually staffed by nurse practitioners or physician assistants that have been trained to treat common illnesses and complaints. They're usually fairly quick and inexpensive. However, if you have serious medical issues or chronic medical problems, these are probably not your best option

## 2019-05-11 LAB — CBC
Hematocrit: 37.6 % (ref 34.0–46.6)
Hemoglobin: 12.7 g/dL (ref 11.1–15.9)
MCH: 29.5 pg (ref 26.6–33.0)
MCHC: 33.8 g/dL (ref 31.5–35.7)
MCV: 87 fL (ref 79–97)
Platelets: 303 10*3/uL (ref 150–450)
RBC: 4.3 x10E6/uL (ref 3.77–5.28)
RDW: 12.6 % (ref 11.7–15.4)
WBC: 5.9 10*3/uL (ref 3.4–10.8)

## 2019-05-11 LAB — TSH: TSH: 2 u[IU]/mL (ref 0.450–4.500)

## 2019-08-02 ENCOUNTER — Other Ambulatory Visit: Payer: Self-pay

## 2019-08-02 ENCOUNTER — Ambulatory Visit (INDEPENDENT_AMBULATORY_CARE_PROVIDER_SITE_OTHER): Payer: POS

## 2019-08-02 VITALS — BP 126/72 | HR 86 | Ht 70.0 in | Wt 274.0 lb

## 2019-08-02 DIAGNOSIS — Z3042 Encounter for surveillance of injectable contraceptive: Secondary | ICD-10-CM | POA: Diagnosis not present

## 2019-08-02 MED ORDER — MEDROXYPROGESTERONE ACETATE 150 MG/ML IM SUSP
150.0000 mg | Freq: Once | INTRAMUSCULAR | Status: AC
  Administered 2019-08-02: 150 mg via INTRAMUSCULAR

## 2019-08-02 NOTE — Progress Notes (Signed)
Presents for DEPO Injection, given in LD, tolerated well.  Patient wants to change to [name]-Mirena.  Next [name] or DEPO June 15-29/2021  Administrations This Visit    medroxyPROGESTERone (DEPO-PROVERA) injection 150 mg    Admin Date 08/02/2019 Action Given Dose 150 mg Route Intramuscular Administered By Maretta Bees, RMA

## 2019-10-04 ENCOUNTER — Ambulatory Visit: Payer: POS | Admitting: Family Medicine

## 2020-07-17 ENCOUNTER — Other Ambulatory Visit: Payer: Self-pay

## 2020-07-17 ENCOUNTER — Ambulatory Visit (INDEPENDENT_AMBULATORY_CARE_PROVIDER_SITE_OTHER): Payer: POS

## 2020-07-17 DIAGNOSIS — Z3202 Encounter for pregnancy test, result negative: Secondary | ICD-10-CM

## 2020-07-17 LAB — POCT URINE PREGNANCY: Preg Test, Ur: NEGATIVE

## 2020-07-17 NOTE — Progress Notes (Signed)
Ms. Kilgour presents today for UPT. She has no unusual complaints.  LMP: 06/15/2020    OBJECTIVE: Appears well, in no apparent distress.  OB History    Gravida  2   Para  2   Term  2   Preterm      AB      Living  2     SAB      IAB      Ectopic      Multiple  0   Live Births  2          Home UPT Result: In-Office UPT result: NEGATIVE I have reviewed the patient's medical, obstetrical, social, and family histories, and medications.   ASSESSMENT: NEGATIVE pregnancy test  PLAN Prenatal care to be completed at: NA

## 2020-11-13 ENCOUNTER — Other Ambulatory Visit: Payer: Self-pay

## 2020-11-13 ENCOUNTER — Ambulatory Visit (INDEPENDENT_AMBULATORY_CARE_PROVIDER_SITE_OTHER): Payer: POS

## 2020-11-13 VITALS — BP 112/79 | HR 89 | Ht 71.0 in | Wt 274.0 lb

## 2020-11-13 DIAGNOSIS — Z3201 Encounter for pregnancy test, result positive: Secondary | ICD-10-CM | POA: Diagnosis not present

## 2020-11-13 LAB — POCT URINE PREGNANCY: Preg Test, Ur: POSITIVE — AB

## 2020-11-13 NOTE — Progress Notes (Signed)
Ms. Daugherty presents today for UPT. She has no unusual complaints.  LMP: 10/27/2020  OBJECTIVE: Appears well, in no apparent distress.  OB History     Gravida  3   Para  2   Term  2   Preterm      AB      Living  2      SAB      IAB      Ectopic      Multiple  0   Live Births  2          Home UPT Result: POSITIVE X 2 In-Office UPT result: POSITIVE  I have reviewed the patient's medical, obstetrical, social, and family histories, and medications.   ASSESSMENT: Positive pregnancy test  LMP  10/27/2020 EDD  08/03/2021 [place]     [redacted]w[redacted]d  PLAN Pren[facility]: [facility]

## 2020-12-02 ENCOUNTER — Inpatient Hospital Stay (HOSPITAL_COMMUNITY): Payer: POS

## 2020-12-02 ENCOUNTER — Other Ambulatory Visit: Payer: Self-pay

## 2020-12-02 ENCOUNTER — Encounter (HOSPITAL_COMMUNITY): Payer: Self-pay | Admitting: Obstetrics and Gynecology

## 2020-12-02 ENCOUNTER — Inpatient Hospital Stay (HOSPITAL_COMMUNITY)
Admission: AD | Admit: 2020-12-02 | Discharge: 2020-12-02 | Disposition: A | Discharge status: Home / Self Care | Payer: POS | Attending: Obstetrics and Gynecology | Admitting: Obstetrics and Gynecology

## 2020-12-02 DIAGNOSIS — O3680X0 Pregnancy with inconclusive fetal viability, not applicable or unspecified: Secondary | ICD-10-CM

## 2020-12-02 DIAGNOSIS — O26891 Other specified pregnancy related conditions, first trimester: Secondary | ICD-10-CM | POA: Diagnosis not present

## 2020-12-02 DIAGNOSIS — Z3A01 Less than 8 weeks gestation of pregnancy: Secondary | ICD-10-CM | POA: Diagnosis not present

## 2020-12-02 DIAGNOSIS — R109 Unspecified abdominal pain: Secondary | ICD-10-CM | POA: Diagnosis not present

## 2020-12-02 DIAGNOSIS — O209 Hemorrhage in early pregnancy, unspecified: Secondary | ICD-10-CM | POA: Diagnosis not present

## 2020-12-02 LAB — URINALYSIS, ROUTINE W REFLEX MICROSCOPIC
Bilirubin Urine: NEGATIVE
Glucose, UA: NEGATIVE mg/dL
Ketones, ur: 15 mg/dL — AB
Leukocytes,Ua: NEGATIVE
Nitrite: NEGATIVE
Protein, ur: 100 mg/dL — AB
Specific Gravity, Urine: >1.03 — ABNORMAL HIGH (ref 1.005–1.030)
pH: 5.5 (ref 5.0–8.0)

## 2020-12-02 LAB — CBC
HCT: 34.2 % — ABNORMAL LOW (ref 36.0–46.0)
Hemoglobin: 11.3 g/dL — ABNORMAL LOW (ref 12.0–15.0)
MCH: 30.2 pg (ref 26.0–34.0)
MCHC: 33 g/dL (ref 30.0–36.0)
MCV: 91.4 fL (ref 80.0–100.0)
Platelets: 281 10*3/uL (ref 150–400)
RBC: 3.74 MIL/uL — ABNORMAL LOW (ref 3.87–5.11)
RDW: 12.7 % (ref 11.5–15.5)
WBC: 4.6 10*3/uL (ref 4.0–10.5)
nRBC: 0 % (ref 0.0–0.2)

## 2020-12-02 LAB — URINALYSIS, MICROSCOPIC (REFLEX)

## 2020-12-02 LAB — HCG, QUANTITATIVE, PREGNANCY: hCG, Beta Chain, Quant, S: 2928 m[IU]/mL — ABNORMAL HIGH (ref <5)

## 2020-12-02 NOTE — MAU Provider Note (Signed)
History     CSN: [id number]  Arrival date and time: 12/02/20 1604   Event Date/Time   First Provider Initiated Contact with Patient 12/02/20 1639      Chief Complaint  Patient presents with   Abdominal Pain   Vaginal Bleeding   HPI Jamie [name] is a 30 y.o. G3P2002 at [redacted]w[redacted]d who presents with vaginal bleeding and abdominal pain. She states it started this am and it is getting progressively worse. She reports the bleeding is like a period. She rates the pain a 5/10 and it is all along the bottom of her belly. She has not tried anything for the pain. She has not been seen anywhere this pregnancy.  OB History     Gravida  3   Para  2   Term  2   Preterm      AB      Living  2      SAB      IAB      Ectopic      Multiple  0   Live Births  2           History reviewed. No pertinent past medical history.  Past Surgical History:  Procedure Laterality Date   NO PAST SURGERIES      Family History  Problem Relation Age of Onset   Heart murmur Mother    Hypertension Mother    Diabetes Father    Stroke Father    Heart disease Father    Heart failure Father    Diabetes Maternal Grandfather    Heart disease Maternal Grandfather    Hypertension Maternal Grandfather    Heart attack Paternal Grandmother    Arrhythmia Brother    Healthy Brother     Social History   Tobacco Use   Smoking status: Never   Smokeless tobacco: Never  Vaping Use   Vaping Use: Never used  Substance Use Topics   Alcohol use: No   Drug use: No    Allergies:  Allergies  Allergen Reactions   Shellfish Allergy Swelling    Throat     Facility-Administered Medications Prior to Admission  Medication Dose Route Frequency Provider Last Rate Last Admin   medroxyPROGESTERone (DEPO-PROVERA) injection 150 mg  150 mg Intramuscular Q90 days Denney, Rachelle A, CNM   150 mg at 02/15/19 1114   Medications Prior to Admission  Medication Sig Dispense Refill Last Dose   albuterol  (PROVENTIL HFA;VENTOLIN HFA) 108 (90 Base) MCG/ACT inhaler Inhale 1-2 puffs into the lungs every 6 (six) hours as needed for wheezing or shortness of breath. (Patient not taking: Reported on 06/15/2018) 1 Inhaler 1    cefUROXime (CEFTIN) 250 MG/5ML suspension Take 10 mLs (500 mg total) by mouth 2 (two) times daily. (Patient not taking: Reported on 04/07/2018) 140 mL 2    Elastic Bandages & Supports (COMFORT FIT MATERNITY SUPP LG) MISC 1 Units by Does not apply route daily. (Patient not taking: Reported on 04/07/2018) 1 each 0    ibuprofen (ADVIL,MOTRIN) 600 MG tablet Take 1 tablet (600 mg total) by mouth every 6 (six) hours as needed. (Patient not taking: Reported on 06/15/2018) 30 tablet 0    medroxyPROGESTERone (DEPO-PROVERA) 150 MG/ML injection Inject 1 mL (150 mg total) into the muscle every 3 (three) months. (Patient not taking: Reported on 11/13/2020) 1 mL 4    Prenatal MV & Min w/FA-DHA (PRENATAL GUMMIES) 0.18-25 MG CHEW Chew by mouth.      Prenatal MV-Min-FA-Omega-3 (PRENATAL GUMMIES/DHA &  FA) 0.4-32.5 MG CHEW 1 chew by mouth daily (Patient not taking: Reported on 11/13/2020)      Prenatal-DSS-FeCb-FeGl-FA (CITRANATAL BLOOM) 90-1 MG TABS Take 1 tablet by mouth daily. (Patient not taking: Reported on 04/07/2018) 30 tablet 12    terconazole (TERAZOL 3) 0.8 % vaginal cream Place 1 applicator vaginally at bedtime. (Patient not taking: Reported on 04/07/2018) 20 g 0     Review of Systems  Constitutional: Negative.  Negative for fatigue and fever.  HENT: Negative.    Respiratory: Negative.  Negative for shortness of breath.   Cardiovascular: Negative.  Negative for chest pain.  Gastrointestinal:  Positive for abdominal pain. Negative for constipation, diarrhea, nausea and vomiting.  Genitourinary:  Positive for vaginal bleeding. Negative for dysuria.  Neurological: Negative.  Negative for dizziness and headaches.  Physical Exam   Blood pressure 116/60, pulse 85, temperature 98.9 F (37.2 C),  temperature source Oral, resp. rate 16, height 5\' 11"  (1.803 m), weight 122.8 kg, last menstrual period 10/27/2020, SpO2 100 %, currently breastfeeding.  Physical Exam Vitals and nursing note reviewed.  Constitutional:      General: She is not in acute distress.    Appearance: She is well-developed.  HENT:     Head: Normocephalic.  Eyes:     Pupils: Pupils are equal, round, and reactive to light.  Cardiovascular:     Rate and Rhythm: Normal rate and regular rhythm.     Heart sounds: Normal heart sounds.  Pulmonary:     Effort: Pulmonary effort is normal. No respiratory distress.     Breath sounds: Normal breath sounds.  Abdominal:     General: Bowel sounds are normal. There is no distension.     Palpations: Abdomen is soft.     Tenderness: There is no abdominal tenderness.  Genitourinary:    Vagina: Bleeding present.  Skin:    General: Skin is warm and dry.  Neurological:     Mental Status: She is alert and oriented to person, place, and time.  Psychiatric:        Mood and Affect: Mood normal.        Behavior: Behavior normal.        Thought Content: Thought content normal.        Judgment: Judgment normal.    MAU Course  Procedures Results for orders placed or performed during the hospital encounter of 12/02/20 (from the past 24 hour(s))  Urinalysis, Routine w reflex microscopic Urine, Clean Catch     Status: Abnormal   Collection Time: 12/02/20  4:26 PM  Result Value Ref Range   Color, Urine YELLOW YELLOW   APPearance HAZY (A) CLEAR   Specific Gravity, Urine >1.030 (H) 1.005 - 1.030   pH 5.5 5.0 - 8.0   Glucose, UA NEGATIVE NEGATIVE mg/dL   Hgb urine dipstick LARGE (A) NEGATIVE   Bilirubin Urine NEGATIVE NEGATIVE   Ketones, ur 15 (A) NEGATIVE mg/dL   Protein, ur 12/04/20 (A) NEGATIVE mg/dL   Nitrite NEGATIVE NEGATIVE   Leukocytes,Ua NEGATIVE NEGATIVE  Urinalysis, Microscopic (reflex)     Status: Abnormal   Collection Time: 12/02/20  4:26 PM  Result Value Ref Range    RBC / HPF 21-50 0 - 5 RBC/hpf   WBC, UA 0-5 0 - 5 WBC/hpf   Bacteria, UA RARE (A) NONE SEEN   Squamous Epithelial / LPF 0-5 0 - 5   Mucus PRESENT   CBC     Status: Abnormal   Collection Time: 12/02/20  4:38  PM  Result Value Ref Range   WBC 4.6 4.0 - 10.5 K/uL   RBC 3.74 (L) 3.87 - 5.11 MIL/uL   Hemoglobin 11.3 (L) 12.0 - 15.0 g/dL   HCT 16.134.2 (L) 09.636.0 - 04.546.0 %   MCV 91.4 80.0 - 100.0 fL   MCH 30.2 26.0 - 34.0 pg   MCHC 33.0 30.0 - 36.0 g/dL   RDW 40.912.7 81.111.5 - 91.415.5 %   Platelets 281 150 - 400 K/uL   nRBC 0.0 0.0 - 0.2 %  hCG, quantitative, pregnancy     Status: Abnormal   Collection Time: 12/02/20  4:38 PM  Result Value Ref Range   hCG, Beta Chain, Quant, S 2,928 (H) <5 mIU/mL    US OB LESS THAN 14 WEEKS WITH OB TRANSVAGINAL  Result Date: 12/02/2020 CLINICAL DATA:  Vaginal bleeding in early pregnancy. EXAM: OBSTETRIC <14 WK ULTRASOUND TECHNIQUE: Transabdominal ultrasound was performed for evaluation of the gestation as well as the maternal uterus and adnexal regions. COMPARISON:  None. FINDINGS: Intrauterine gestational sac: None Yolk sac:  Not Visualized. Embryo:  Not Visualized. Cardiac Activity: Not Visualized. Subchorionic hemorrhage:  None visualized. Maternal uterus/adnexae: Normal appearance of the bilateral ovaries. Mild free fluid in the cul-de-sac. IMPRESSION: 1. No IUP is visualized. By definition, in the setting of a positive pregnancy test, this reflects a pregnancy of unknown location. Differential considerations include early normal IUP, abnormal IUP/missed abortion, or nonvisualized ectopic pregnancy. Serial beta HCG is suggested. Consider repeat pelvic ultrasound in 14 days. 2.  Mild free fluid in the pelvis. Electronically Signed   By: Emmaline KluverNancy  Ballantyne M.D.   On: 12/02/2020 18:15     MDM UA, UPT CBC, HCG ABO/Rh- O Pos US OB Comp Less 14 weeks with Transvaginal  Consulted with Dr. Donavan FoilBass to review ultrasound images- will come assess patient.   Patient reporting pain as  cramping with non acute abdomen. Pregnancy is highly desired. Will bring patient back for repeat HCG in 48 hours.   Discussed with client the diagnosis of pregnancy of unknown anatomic location.  Three possibilities of outcome are: a healthy pregnancy that is too early to see a yolk sac to confirm the pregnancy is in the uterus, a pregnancy that is not healthy and has not developed and will not develop, and an ectopic pregnancy that is in the abdomen that cannot be identified at this time.  And ectopic pregnancy can be a life threatening situation as a pregnancy needs to be in the uterus which is a muscle and can stretch to accommodate the growth of a pregnancy.  Other structures in the pelvis and abdomen as not muscular and do not stretch with the growth of a pregnancy.  Worst case scenario is that a structure ruptures with a growing pregnancy not in the uterus and and internal hemorrhage can be a life threatening situation.  We need to follow the progression of this pregnancy carefully.  We need to check another serum pregnancy hormone level to determine if the levels are rising appropriately  and to determine the next steps that are needed for you. Patient's questions were answered.  Assessment and Plan   1. Pregnancy of unknown anatomic location   2. Vaginal bleeding affecting early pregnancy   3. [redacted] weeks gestation of pregnancy    -Discharge home in stable condition -Strict ectopic precautions discussed -Patient advised to follow-up with MAU on Tuesday for repeat HCG. Will bring back to MAU due to high concern for ectopic and need for  possible MTX.  -Patient may return to MAU as needed or if her condition were to change or worsen   Rolm Bookbinder CNM 12/02/2020, 4:39 PM

## 2020-12-02 NOTE — MAU Note (Signed)
Jamie [name] is a 30 y.o. at [redacted]w[redacted]d here in MAU reporting: started spotting yesterday and now the bleeding is heavier. Also having some clots. The clots have been quarter sized. Having some cramping.  Onset of complaint: yesterday  Pain score: 5/10  Vitals:   12/02/20 1633  BP: 116/60  Pulse: 85  Resp: 16  Temp: 98.9 F (37.2 C)  SpO2: 100%     Lab orders placed from triage: UA

## 2020-12-02 NOTE — Discharge Instructions (Signed)

## 2020-12-04 ENCOUNTER — Encounter (HOSPITAL_COMMUNITY): Payer: Self-pay | Admitting: Obstetrics & Gynecology

## 2020-12-04 ENCOUNTER — Other Ambulatory Visit: Payer: Self-pay

## 2020-12-04 ENCOUNTER — Inpatient Hospital Stay (HOSPITAL_COMMUNITY)
Admission: AD | Admit: 2020-12-04 | Discharge: 2020-12-04 | Disposition: A | Discharge status: Home / Self Care | Payer: POS | Attending: Obstetrics & Gynecology | Admitting: Obstetrics & Gynecology

## 2020-12-04 DIAGNOSIS — O039 Complete or unspecified spontaneous abortion without complication: Secondary | ICD-10-CM | POA: Diagnosis not present

## 2020-12-04 DIAGNOSIS — Z3A01 Less than 8 weeks gestation of pregnancy: Secondary | ICD-10-CM

## 2020-12-04 LAB — CBC
HCT: 35.5 % — ABNORMAL LOW (ref 36.0–46.0)
Hemoglobin: 11.9 g/dL — ABNORMAL LOW (ref 12.0–15.0)
MCH: 30.7 pg (ref 26.0–34.0)
MCHC: 33.5 g/dL (ref 30.0–36.0)
MCV: 91.7 fL (ref 80.0–100.0)
Platelets: 351 10*3/uL (ref 150–400)
RBC: 3.87 MIL/uL (ref 3.87–5.11)
RDW: 12.4 % (ref 11.5–15.5)
WBC: 6.1 10*3/uL (ref 4.0–10.5)
nRBC: 0 % (ref 0.0–0.2)

## 2020-12-04 LAB — COMPREHENSIVE METABOLIC PANEL
ALT: 15 U/L (ref 0–44)
AST: 20 U/L (ref 15–41)
Albumin: 3.6 g/dL (ref 3.5–5.0)
Alkaline Phosphatase: 45 U/L (ref 38–126)
Anion gap: 8 (ref 5–15)
BUN: 8 mg/dL (ref 6–20)
CO2: 27 mmol/L (ref 22–32)
Calcium: 8.8 mg/dL — ABNORMAL LOW (ref 8.9–10.3)
Chloride: 104 mmol/L (ref 98–111)
Creatinine, Ser: 0.79 mg/dL (ref 0.44–1.00)
GFR, Estimated: >60 mL/min (ref >60)
Glucose, Bld: 111 mg/dL — ABNORMAL HIGH (ref 70–99)
Potassium: 3.5 mmol/L (ref 3.5–5.1)
Sodium: 139 mmol/L (ref 135–145)
Total Bilirubin: 0.3 mg/dL (ref 0.3–1.2)
Total Protein: 6.9 g/dL (ref 6.5–8.1)

## 2020-12-04 LAB — TYPE AND SCREEN
ABO/RH(D): O POS
Antibody Screen: NEGATIVE

## 2020-12-04 LAB — HCG, QUANTITATIVE, PREGNANCY: hCG, Beta Chain, Quant, S: 636 m[IU]/mL — ABNORMAL HIGH (ref <5)

## 2020-12-04 NOTE — MAU Note (Signed)
Jamie [name] is a 30 y.o. at [redacted]w[redacted]d here in MAU reporting: here for repeat hcg. Is having mild cramping and is bleeding like a period, states the bleeding seems less today and is mostly just when she wipes. No clots.   Onset of complaint: ongoing  Pain score: 2/10  Vitals:   12/04/20 2133  BP: 117/67  Pulse: 75  Resp: 16  Temp: 98.4 F (36.9 C)  SpO2: 99%     Lab orders placed from triage: hcg

## 2020-12-04 NOTE — MAU Provider Note (Signed)
History   Chief Complaint:  Labs Only   Jamie [name] is  30 y.o. [id number] Patient's last menstrual period was 10/27/2020 (exact date).. Patient is here for follow up of quantitative HCG and ongoing surveillance of pregnancy status. She is [redacted]w[redacted]d weeks gestation  by LMP.    Since her last visit, the patient is without new complaint. The patient reports bleeding as  similar to period.  She denies any pain.  General ROS:  negative  Her previous Quantitative HCG values are: Component     Latest Ref Rng & Units 12/02/2020  HCG, Beta Chain, Quant, S     <5 mIU/mL 2,928 (H)      Physical Exam   Last menstrual period 10/27/2020, currently breastfeeding.  Physical Examination: General appearance - alert, well appearing, and in no distress Mental status - normal mood, behavior, speech, dress, motor activity, and thought processes Eyes - sclera anicteric Chest - normal respiratory effort  Labs: Results for orders placed or performed during the hospital encounter of 12/04/20 (from the past 24 hour(s))  CBC   Collection Time: 12/04/20  9:10 PM  Result Value Ref Range   WBC 6.1 4.0 - 10.5 K/uL   RBC 3.87 3.87 - 5.11 MIL/uL   Hemoglobin 11.9 (L) 12.0 - 15.0 g/dL   HCT 56.4 (L) 33.2 - 95.1 %   MCV 91.7 80.0 - 100.0 fL   MCH 30.7 26.0 - 34.0 pg   MCHC 33.5 30.0 - 36.0 g/dL   RDW 88.4 16.6 - 06.3 %   Platelets 351 150 - 400 K/uL   nRBC 0.0 0.0 - 0.2 %  Comprehensive metabolic panel   Collection Time: 12/04/20  9:10 PM  Result Value Ref Range   Sodium 139 135 - 145 mmol/L   Potassium 3.5 3.5 - 5.1 mmol/L   Chloride 104 98 - 111 mmol/L   CO2 27 22 - 32 mmol/L   Glucose, Bld 111 (H) 70 - 99 mg/dL   BUN 8 6 - 20 mg/dL   Creatinine, Ser 0.16 0.44 - 1.00 mg/dL   Calcium 8.8 (L) 8.9 - 10.3 mg/dL   Total Protein 6.9 6.5 - 8.1 g/dL   Albumin 3.6 3.5 - 5.0 g/dL   AST 20 15 - 41 U/L   ALT 15 0 - 44 U/L   Alkaline Phosphatase 45 38 - 126 U/L   Total Bilirubin 0.3 0.3 - 1.2 mg/dL   GFR,  Estimated >01 >09 mL/min   Anion gap 8 5 - 15  hCG, quantitative, pregnancy   Collection Time: 12/04/20  9:10 PM  Result Value Ref Range   hCG, Beta Chain, Quant, S 636 (H) <5 mIU/mL  Type and screen [facility]   Collection Time: 12/04/20  9:10 PM  Result Value Ref Range   ABO/RH(D) O POS    Antibody Screen NEG    Sample Expiration      12/07/2020,2359 Performed at [facility], [address] [place], [place] [address]     Ultrasound Studies:   No results found.  Assessment:   1. Miscarriage   2. [redacted] weeks gestation of pregnancy   -Patient reports that she had heavy bleeding and believes that she passed the pregnancy just prior to her MAU visit the other day.  hCG has significantly dropped today indicating that she has had a miscarriage.  She is Rh+.   Plan: -Discharge home in stable condition -Bleeding & infection precautions discussed -Patient advised to follow-up with [facility] -  message sent to the office -Patient may return to MAU as needed or if her condition were to change or worsen  Judeth Horn, NP 12/04/2020, 9:13 PM

## 2020-12-18 ENCOUNTER — Ambulatory Visit (INDEPENDENT_AMBULATORY_CARE_PROVIDER_SITE_OTHER): Payer: POS | Admitting: Obstetrics and Gynecology

## 2020-12-18 ENCOUNTER — Encounter: Payer: Self-pay | Admitting: Obstetrics and Gynecology

## 2020-12-18 ENCOUNTER — Other Ambulatory Visit: Payer: Self-pay

## 2020-12-18 VITALS — BP 118/77 | HR 82 | Ht 71.0 in | Wt 268.0 lb

## 2020-12-18 DIAGNOSIS — O039 Complete or unspecified spontaneous abortion without complication: Secondary | ICD-10-CM | POA: Diagnosis not present

## 2020-12-18 NOTE — Progress Notes (Signed)
30 yo P2012 presenting today as an MAU follow up. Patient was diagnosed with a miscarriage on 8/2. She reports resolution of vaginal bleeding. Patient states vaginal bleeding lastest close to 10 days. She denies any pelvic pain or cramping. She admits to some fatigue. She discontinued her prenatal vitamins on 8/2. Patient is without any other complaints.   History reviewed. No pertinent past medical history. Past Surgical History:  Procedure Laterality Date   NO PAST SURGERIES     Family History  Problem Relation Age of Onset   Heart murmur Mother    Hypertension Mother    Diabetes Father    Stroke Father    Heart disease Father    Heart failure Father    Diabetes Maternal Grandfather    Heart disease Maternal Grandfather    Hypertension Maternal Grandfather    Heart attack Paternal Grandmother    Arrhythmia Brother    Healthy Brother    Social History   Tobacco Use   Smoking status: Never   Smokeless tobacco: Never  Vaping Use   Vaping Use: Never used  Substance Use Topics   Alcohol use: No   Drug use: No   ROS See pertinent in HPI. All other systems reviewed and non contributory  Blood pressure 118/77, pulse 82, height 5\' 11"  (1.803 m), weight 268 lb (121.6 kg), last menstrual period 10/27/2020, not currently breastfeeding. GENERAL: Well-developed, well-nourished female in no acute distress.  NEURO: alert and oriented x 3  A/P 30 yo with recent spontaneous miscarriage - Quant HCG today - Emotional support provided - A1c, TSH and CBC ordered today - Patient will be contacted with any abnormal results - Patient desires to conceive again. Advised patient to restart taking prenatal vitamins.

## 2020-12-18 NOTE — Progress Notes (Signed)
Pt is here today for f/u to MAB. States she stopped having bleeding approximately 3 days ago.

## 2020-12-19 LAB — CBC
Hematocrit: 35.7 % (ref 34.0–46.6)
Hemoglobin: 12 g/dL (ref 11.1–15.9)
MCH: 30.1 pg (ref 26.6–33.0)
MCHC: 33.6 g/dL (ref 31.5–35.7)
MCV: 90 fL (ref 79–97)
Platelets: 328 10*3/uL (ref 150–450)
RBC: 3.99 x10E6/uL (ref 3.77–5.28)
RDW: 12.2 % (ref 11.7–15.4)
WBC: 5.1 10*3/uL (ref 3.4–10.8)

## 2020-12-19 LAB — HEMOGLOBIN A1C
Est. average glucose Bld gHb Est-mCnc: 108 mg/dL
Hgb A1c MFr Bld: 5.4 % (ref 4.8–5.6)

## 2020-12-19 LAB — TSH: TSH: 1.99 u[IU]/mL (ref 0.450–4.500)

## 2020-12-19 LAB — BETA HCG QUANT (REF LAB): hCG Quant: 5 m[IU]/mL

## 2021-01-30 ENCOUNTER — Other Ambulatory Visit (HOSPITAL_COMMUNITY)
Admission: RE | Admit: 2021-01-30 | Discharge: 2021-01-30 | Disposition: A | Discharge status: Home / Self Care | Payer: POS | Source: Ambulatory Visit | Attending: Obstetrics and Gynecology | Admitting: Obstetrics and Gynecology

## 2021-01-30 ENCOUNTER — Ambulatory Visit (INDEPENDENT_AMBULATORY_CARE_PROVIDER_SITE_OTHER): Payer: POS

## 2021-01-30 ENCOUNTER — Other Ambulatory Visit: Payer: Self-pay

## 2021-01-30 VITALS — BP 124/82 | HR 87 | Ht 70.0 in | Wt 265.0 lb

## 2021-01-30 DIAGNOSIS — N898 Other specified noninflammatory disorders of vagina: Secondary | ICD-10-CM | POA: Diagnosis not present

## 2021-01-30 DIAGNOSIS — N926 Irregular menstruation, unspecified: Secondary | ICD-10-CM

## 2021-01-30 DIAGNOSIS — Z3202 Encounter for pregnancy test, result negative: Secondary | ICD-10-CM | POA: Diagnosis not present

## 2021-01-30 LAB — POCT URINE PREGNANCY: Preg Test, Ur: NEGATIVE

## 2021-01-30 NOTE — Progress Notes (Signed)
SUBJECTIVE:  30 y.o. female complains of malodorous vaginal discharge for 4-5 day(s). Denies abnormal vaginal bleeding or significant pelvic pain or fever. No UTI symptoms. Denies history of known exposure to STD.  Patient's last menstrual period was 12/26/2020 (exact date).  OBJECTIVE:  She appears well, afebrile. Urine dipstick: not done. UPT requested per patient.   ASSESSMENT:  Vaginal Odor   PLAN:  GC, chlamydia, trichomonas, BVAG, CVAG probe sent to lab. Treatment: To be determined once lab results are received ROV prn if symptoms persist or worsen.

## 2021-01-31 LAB — CERVICOVAGINAL ANCILLARY ONLY
Bacterial Vaginitis (gardnerella): POSITIVE — AB
Candida Glabrata: NEGATIVE
Candida Vaginitis: NEGATIVE
Chlamydia: NEGATIVE
Comment: NEGATIVE
Comment: NEGATIVE
Comment: NEGATIVE
Comment: NEGATIVE
Comment: NEGATIVE
Comment: NORMAL
Neisseria Gonorrhea: NEGATIVE
Trichomonas: NEGATIVE

## 2021-02-01 ENCOUNTER — Telehealth: Payer: Self-pay | Admitting: *Deleted

## 2021-02-01 ENCOUNTER — Encounter: Payer: Self-pay | Admitting: *Deleted

## 2021-02-01 ENCOUNTER — Other Ambulatory Visit: Payer: Self-pay | Admitting: *Deleted

## 2021-02-01 DIAGNOSIS — B9689 Other specified bacterial agents as the cause of diseases classified elsewhere: Secondary | ICD-10-CM

## 2021-02-01 MED ORDER — METRONIDAZOLE 500 MG PO TABS: 500.0000 mg | ORAL_TABLET | Freq: Two times a day (BID) | ORAL | 0 refills | Status: DC

## 2021-02-01 NOTE — Progress Notes (Signed)
RX sent for Flagyl.

## 2021-02-01 NOTE — Telephone Encounter (Signed)
Returned TC to patient regarding request for RX BV. No Answer. Left VM.

## 2021-05-05 NOTE — L&D Delivery Note (Addendum)
Delivery Note At 6:42 PM a viable female was delivered via Vaginal, Spontaneous (Presentation: Right  Occiput Anterior).  APGAR: 9, 9; weight  pending. IV Pitocin started immediately after delivery. Placenta status: Spontaneous, Intact Uterus firm, midline @ Umbilicus.  Cord: 3 vessels with the following complications: None.   Anesthesia: None Episiotomy: None Lacerations: 1st degree, hemostatic Suture Repair:  none Est. Blood Loss (mL): 205  Mom to postpartum.  Baby to Couplet care / Skin to Skin.  Hollace Hayward, SNM 01/29/2022, 7:04 PM   Attestation of CNM Supervision of Midwife Student: Evaluation and management procedures were performed by the midwife student under my supervision. I was immediately available for direct supervision, assistance and direction throughout this encounter.  I also confirm that I have verified the information documented in the resident's note, and that I have also personally reperformed the pertinent components of the physical exam and all of the medical decision making activities.  I have also made any necessary editorial changes.  Uncomplicated SVD @1842  viable female. First degree laceration, hemostatic not requiring repair. Bleeding minimal. IV Pitocin started after delivery.   Renee Harder, CNM 01/29/2022 7:11 PM

## 2021-06-12 ENCOUNTER — Ambulatory Visit (INDEPENDENT_AMBULATORY_CARE_PROVIDER_SITE_OTHER): Payer: POS | Admitting: *Deleted

## 2021-06-12 ENCOUNTER — Other Ambulatory Visit: Payer: Self-pay

## 2021-06-12 VITALS — BP 111/77 | HR 80 | Ht 70.0 in | Wt 281.3 lb

## 2021-06-12 DIAGNOSIS — N926 Irregular menstruation, unspecified: Secondary | ICD-10-CM | POA: Diagnosis not present

## 2021-06-12 DIAGNOSIS — Z3480 Encounter for supervision of other normal pregnancy, unspecified trimester: Secondary | ICD-10-CM

## 2021-06-12 LAB — POCT URINE PREGNANCY: Preg Test, Ur: POSITIVE — AB

## 2021-06-12 MED ORDER — PROMETHAZINE HCL 25 MG PO TABS: 25.0000 mg | ORAL_TABLET | Freq: Four times a day (QID) | ORAL | 1 refills | Status: DC | PRN

## 2021-06-12 NOTE — Progress Notes (Signed)
Jamie [name] presents today for UPT. She has no unusual complaints. LMP: 04/06/22    OBJECTIVE: Appears well, in no apparent distress.  OB History     Gravida  3   Para  2   Term  2   Preterm      AB  1   Living  2      SAB  1   IAB      Ectopic      Multiple  0   Live Births  2          Home UPT Result: positive (recently, had several negatives in Jan) In-Office UPT result: positive I have reviewed the patient's medical, obstetrical, social, and family histories, and medications.   ASSESSMENT: Positive pregnancy test  PLAN Prenatal care to be completed at: [facility] Scheduled for OB US viability and Intake Has PNV already. RX phenergan for nausea.

## 2021-06-24 ENCOUNTER — Ambulatory Visit (INDEPENDENT_AMBULATORY_CARE_PROVIDER_SITE_OTHER): Payer: POS

## 2021-06-24 ENCOUNTER — Other Ambulatory Visit: Payer: Self-pay

## 2021-06-24 VITALS — BP 124/78 | HR 93 | Ht 69.0 in | Wt 279.6 lb

## 2021-06-24 DIAGNOSIS — Z3481 Encounter for supervision of other normal pregnancy, first trimester: Secondary | ICD-10-CM

## 2021-06-24 DIAGNOSIS — O219 Vomiting of pregnancy, unspecified: Secondary | ICD-10-CM

## 2021-06-24 DIAGNOSIS — Z3491 Encounter for supervision of normal pregnancy, unspecified, first trimester: Secondary | ICD-10-CM | POA: Insufficient documentation

## 2021-06-24 DIAGNOSIS — Z3480 Encounter for supervision of other normal pregnancy, unspecified trimester: Secondary | ICD-10-CM | POA: Insufficient documentation

## 2021-06-24 DIAGNOSIS — Z3403 Encounter for supervision of normal first pregnancy, third trimester: Secondary | ICD-10-CM | POA: Insufficient documentation

## 2021-06-24 DIAGNOSIS — Z3A08 8 weeks gestation of pregnancy: Secondary | ICD-10-CM | POA: Diagnosis not present

## 2021-06-24 MED ORDER — DOXYLAMINE-PYRIDOXINE 10-10 MG PO TBEC: 2.0000 | DELAYED_RELEASE_TABLET | Freq: Every day | ORAL | 5 refills | Status: DC

## 2021-06-24 NOTE — Progress Notes (Cosign Needed)
New OB Intake  I connected with  Jamie [name] on 06/24/21 at  8:15 AM EST by in person Video Visit and verified that I am speaking with the correct person using two identifiers. Nurse is located at [facility] and pt is located at [place].  I discussed the limitations, risks, security and privacy concerns of performing an evaluation and management service by telephone and the availability of in person appointments. I also discussed with the patient that there may be a patient responsible charge related to this service. The patient expressed understanding and agreed to proceed.  I explained I am completing New OB Intake today. We discussed her EDD of 02/03/22 that is based on early u/s. Pt is G4/P2012. I reviewed her allergies, medications, Medical/Surgical/OB history, and appropriate screenings. I informed her of [facility] services. Based on history, this is a/an  pregnancy uncomplicated .   There are no problems to display for this patient.   Concerns addressed today  Delivery Plans:  Plans to deliver at [facility].   MyChart/Babyscripts MyChart access verified. I explained pt will have some visits in office and some virtually. Babyscripts instructions given and order placed. Patient verifies receipt of registration text/e-mail. Account successfully created and app downloaded.  Blood Pressure Cuff  Patient has private insurance; instructed to purchase blood pressure cuff and bring to first prenatal appt. Explained after first prenatal appt pt will check weekly and document in Babyscripts.  Weight scale: Patient does have weight scale.   Anatomy US Explained first scheduled [place] will be around 19 weeks. Dating and viability scan performed today  Labs Discussed Avelina Laine genetic screening with patient. Would like both Panorama and Horizon drawn at new OB visit.Also if interested in genetic testing, tell patient she will need AFP 15-21 weeks to complete genetic testing .Routine prenatal labs  needed.  Covid Vaccine Patient has covid vaccine.    Informed patient of [facility] website  and placed link in her AVS.   Social Determinants of Health Food Insecurity: Patient denies food insecurity. WIC Referral: Patient is interested in referral to [facility].  Transportation: Patient denies transportation needs. Childcare: Discussed no children allowed at ultrasound appointments. Offered childcare services; patient declines childcare services at this time.  Send link to Pregnancy Navigators   Placed OB Box on problem list and updated  First visit review I reviewed new OB appt with pt. I explained she will have a pelvic exam, ob bloodwork with genetic screening, and PAP smear. Explained pt will be seen by Mariel Aloe at first visit; encounter routed to appropriate provider. Explained that patient will be seen by pregnancy navigator following visit with provider. [facility] information placed in AVS.   Hamilton Capri, RN 06/24/2021  8:09 AM

## 2021-06-27 ENCOUNTER — Other Ambulatory Visit: Payer: Self-pay | Admitting: *Deleted

## 2021-06-27 DIAGNOSIS — Z3481 Encounter for supervision of other normal pregnancy, first trimester: Secondary | ICD-10-CM

## 2021-06-27 NOTE — Progress Notes (Signed)
TC from patient regarding babyscripts link not working. Order reentered and link sent.

## 2021-06-30 ENCOUNTER — Other Ambulatory Visit: Payer: Self-pay

## 2021-06-30 ENCOUNTER — Inpatient Hospital Stay (HOSPITAL_COMMUNITY)
Admission: AD | Admit: 2021-06-30 | Discharge: 2021-06-30 | Disposition: A | Discharge status: Home / Self Care | Payer: POS | Attending: Obstetrics & Gynecology | Admitting: Obstetrics & Gynecology

## 2021-06-30 ENCOUNTER — Encounter (HOSPITAL_COMMUNITY): Payer: Self-pay | Admitting: Obstetrics & Gynecology

## 2021-06-30 DIAGNOSIS — R109 Unspecified abdominal pain: Secondary | ICD-10-CM | POA: Insufficient documentation

## 2021-06-30 DIAGNOSIS — O99611 Diseases of the digestive system complicating pregnancy, first trimester: Secondary | ICD-10-CM | POA: Insufficient documentation

## 2021-06-30 DIAGNOSIS — O26891 Other specified pregnancy related conditions, first trimester: Secondary | ICD-10-CM | POA: Diagnosis present

## 2021-06-30 DIAGNOSIS — Z3A08 8 weeks gestation of pregnancy: Secondary | ICD-10-CM | POA: Diagnosis not present

## 2021-06-30 DIAGNOSIS — K30 Functional dyspepsia: Secondary | ICD-10-CM | POA: Diagnosis not present

## 2021-06-30 LAB — URINALYSIS, ROUTINE W REFLEX MICROSCOPIC
Bacteria, UA: NONE SEEN
Bilirubin Urine: NEGATIVE
Glucose, UA: NEGATIVE mg/dL
Hgb urine dipstick: NEGATIVE
Ketones, ur: NEGATIVE mg/dL
Nitrite: NEGATIVE
Protein, ur: NEGATIVE mg/dL
Specific Gravity, Urine: 1.03 (ref 1.005–1.030)
pH: 5 (ref 5.0–8.0)

## 2021-06-30 MED ORDER — DICYCLOMINE HCL 10 MG/5ML PO SOLN
10.0000 mg | Freq: Once | ORAL | Status: AC
  Administered 2021-06-30: 10 mg via ORAL
  Filled 2021-06-30 (×2): qty 5

## 2021-06-30 MED ORDER — ACETAMINOPHEN 500 MG PO TABS
1000.0000 mg | ORAL_TABLET | Freq: Once | ORAL | Status: AC
  Administered 2021-06-30: 1000 mg via ORAL
  Filled 2021-06-30: qty 2

## 2021-06-30 MED ORDER — LIDOCAINE VISCOUS HCL 2 % MT SOLN
15.0000 mL | Freq: Once | OROMUCOSAL | Status: AC
Start: 2021-06-30 — End: 2021-06-30
  Administered 2021-06-30: 15 mL via ORAL
  Filled 2021-06-30: qty 15

## 2021-06-30 MED ORDER — OMEPRAZOLE 20 MG PO CPDR: 20.0000 mg | DELAYED_RELEASE_CAPSULE | Freq: Every day | ORAL | 1 refills | Status: DC

## 2021-06-30 MED ORDER — ALUM & MAG HYDROXIDE-SIMETH 200-200-20 MG/5ML PO SUSP
30.0000 mL | Freq: Once | ORAL | Status: AC
  Administered 2021-06-30: 30 mL via ORAL
  Filled 2021-06-30: qty 30

## 2021-06-30 NOTE — Progress Notes (Signed)
Patient notified about pharmacy delay. Patient was understanding and coping with pain. Warm blanket given with heating packs to place on patients side. Still waiting for pharmacy to send med. Pharmacy contacted x2.

## 2021-06-30 NOTE — MAU Provider Note (Signed)
History     CSN: [id number]  Arrival date and time: 06/30/21 1946   Event Date/Time   First Provider Initiated Contact with Patient 06/30/21 2054      Chief Complaint  Patient presents with   Pain   [name] is a 31 y.o. year old [id number] female at [redacted]w[redacted]d weeks gestation who presents to MAU reporting RT sided pain under her ribcage that started out "like a muscle spasm or [place]" at 1300 this afternoon. She noticed the pain about 30 minutes after eating [place] food for lunch. She reports movement makes the pain worse. She denies VB, but reports a clear odorless vaginal d/c. She denies N/V. She had a BM after getting home from work today. She also has increased her water intake after work and the pain is "easing off." She has not taken anything for the pain. She scheduled to receive [facility]; first appt is 07/19/2021.    OB History     Gravida  4   Para  2   Term  2   Preterm      AB  1   Living  2      SAB  1   IAB      Ectopic      Multiple  0   Live Births  2           History reviewed. No pertinent past medical history.  Past Surgical History:  Procedure Laterality Date   NO PAST SURGERIES      Family History  Problem Relation Age of Onset   Heart murmur Mother    Hypertension Mother    Diabetes Father    Stroke Father    Heart disease Father    Heart failure Father    Arrhythmia Brother    Healthy Brother    Diabetes Maternal Grandfather    Heart disease Maternal Grandfather    Hypertension Maternal Grandfather    Heart attack Paternal Grandmother     Social History   Tobacco Use   Smoking status: Never   Smokeless tobacco: Never  Vaping Use   Vaping Use: Never used  Substance Use Topics   Alcohol use: Not Currently    Comment: socially, before pregnancy   Drug use: No    Allergies:  Allergies  Allergen Reactions   Shellfish Allergy Swelling    Throat    Prednisone Other (See Comments)    "flutters"   bradycardia     Medications Prior to Admission  Medication Sig Dispense Refill Last Dose   Doxylamine-Pyridoxine (DICLEGIS) 10-10 MG TBEC Take 2 tablets by mouth at bedtime. If symptoms persist, add one tablet in the morning and one in the afternoon 100 tablet 5 06/29/2021   Prenatal MV & Min w/FA-DHA (PRENATAL GUMMIES PO) Take by mouth.   06/29/2021   albuterol (VENTOLIN HFA) 108 (90 Base) MCG/ACT inhaler Inhale into the lungs.   More than a month   promethazine (PHENERGAN) 25 MG tablet Take 1 tablet (25 mg total) by mouth every 6 (six) hours as needed for nausea or vomiting. (Patient not taking: Reported on 06/24/2021) 30 tablet 1     Review of Systems  Constitutional: Negative.   HENT: Negative.    Eyes: Negative.   Respiratory: Negative.    Cardiovascular: Negative.   Gastrointestinal:  Positive for abdominal pain (RT side under ribcage).  Endocrine: Negative.   Genitourinary: Negative.   Musculoskeletal: Negative.   Skin: Negative.   Allergic/Immunologic: Negative.  Neurological: Negative.   Hematological: Negative.   Psychiatric/Behavioral: Negative.    Physical Exam   Blood pressure 119/66, pulse 94, temperature 98 F (36.7 C), temperature source Oral, resp. rate 18, weight 127.3 kg, last menstrual period 04/06/2021, SpO2 99 %.  Physical Exam Vitals and nursing note reviewed.  Constitutional:      Appearance: Normal appearance. She is obese.  Cardiovascular:     Rate and Rhythm: Normal rate.  Pulmonary:     Effort: Pulmonary effort is normal.  Abdominal:     Palpations: Abdomen is soft.  Genitourinary:    Comments: Not indicated Musculoskeletal:        General: Normal range of motion.  Skin:    General: Skin is warm and dry.  Neurological:     Mental Status: She is alert and oriented to person, place, and time.  Psychiatric:        Mood and Affect: Mood normal.        Behavior: Behavior normal.        Thought Content: Thought content normal.         Judgment: Judgment normal.    MAU Course  Procedures  MDM CCUA G.I. Cocktail -- able to belch more, (+) flatus, no BM Tylenol 1000 mg -- improved pain  Results for orders placed or performed during the hospital encounter of 06/30/21 (from the past 24 hour(s))  Urinalysis, Routine w reflex microscopic     Status: Abnormal   Collection Time: 06/30/21  9:29 PM  Result Value Ref Range   Color, Urine YELLOW YELLOW   APPearance HAZY (A) CLEAR   Specific Gravity, Urine 1.030 1.005 - 1.030   pH 5.0 5.0 - 8.0   Glucose, UA NEGATIVE NEGATIVE mg/dL   Hgb urine dipstick NEGATIVE NEGATIVE   Bilirubin Urine NEGATIVE NEGATIVE   Ketones, ur NEGATIVE NEGATIVE mg/dL   Protein, ur NEGATIVE NEGATIVE mg/dL   Nitrite NEGATIVE NEGATIVE   Leukocytes,Ua TRACE (A) NEGATIVE   RBC / HPF 0-5 0 - 5 RBC/hpf   WBC, UA 0-5 0 - 5 WBC/hpf   Bacteria, UA NONE SEEN NONE SEEN   Squamous Epithelial / LPF 0-5 0 - 5   Mucus PRESENT    Assessment and Plan  1. Abdominal pain during pregnancy in first trimester   2. Indigestion  - Rx for Prilosec 20 mg daily - Information provided on indigestion - Advised to avoid [place] or spicy foods   3. [redacted] weeks gestation of pregnancy   - Discharge patient - Keep scheduled appt with [facility] on 07/19/2021 - Patient verbalized an understanding of the plan of care and agrees.    Raelyn Mora, CNM 06/30/2021, 8:54 PM

## 2021-06-30 NOTE — Discharge Instructions (Signed)
Avoid [place] or spicy foods

## 2021-06-30 NOTE — MAU Note (Signed)
Pt reports to MAU with c/o pain that feels like a muscle spasm that started around 1300 today.  Pt states that it has increased in pain and hurts when she moves.  Denies VB however is having clear, odorless discharge.

## 2021-07-04 ENCOUNTER — Encounter: Payer: POS | Admitting: Obstetrics and Gynecology

## 2021-07-08 ENCOUNTER — Telehealth: Payer: Self-pay

## 2021-07-08 NOTE — Telephone Encounter (Signed)
Returned call and answered questions about babyscripts and results from mau visit. Pt would like to be checked for UTI, advised scheduler will contact for appt. ?

## 2021-07-19 ENCOUNTER — Other Ambulatory Visit: Payer: Self-pay

## 2021-07-19 ENCOUNTER — Ambulatory Visit (INDEPENDENT_AMBULATORY_CARE_PROVIDER_SITE_OTHER): Payer: POS

## 2021-07-19 ENCOUNTER — Other Ambulatory Visit (HOSPITAL_COMMUNITY)
Admission: RE | Admit: 2021-07-19 | Discharge: 2021-07-19 | Disposition: A | Discharge status: Home / Self Care | Payer: POS | Source: Ambulatory Visit | Attending: Obstetrics and Gynecology | Admitting: Obstetrics and Gynecology

## 2021-07-19 VITALS — BP 113/76 | HR 87 | Wt 277.0 lb

## 2021-07-19 DIAGNOSIS — O99210 Obesity complicating pregnancy, unspecified trimester: Secondary | ICD-10-CM

## 2021-07-19 DIAGNOSIS — Z3481 Encounter for supervision of other normal pregnancy, first trimester: Secondary | ICD-10-CM | POA: Insufficient documentation

## 2021-07-19 DIAGNOSIS — R102 Pelvic and perineal pain: Secondary | ICD-10-CM

## 2021-07-19 DIAGNOSIS — O26891 Other specified pregnancy related conditions, first trimester: Secondary | ICD-10-CM

## 2021-07-19 DIAGNOSIS — Z3689 Encounter for other specified antenatal screening: Secondary | ICD-10-CM

## 2021-07-19 DIAGNOSIS — Z3A11 11 weeks gestation of pregnancy: Secondary | ICD-10-CM

## 2021-07-19 MED ORDER — ASPIRIN EC 81 MG PO TBEC: 81.0000 mg | DELAYED_RELEASE_TABLET | Freq: Every day | ORAL | 2 refills | Status: DC

## 2021-07-19 NOTE — Progress Notes (Signed)
Subjective:   Jamie [name] is a 31 y.o. [id number] at [redacted]w[redacted]d by 8 week early ultrasound on Jun 24, 2021 being seen today for her first obstetrical visit.  Patient states this was an planned pregnancy.  Patient reports she Depo Provera on birth control prior to conception.  She has also used Mirena [name].  Gynecological/Obstetrical History: Patient reports no history of gynecological surgeries.  Patient denies history of abnormal pap smears.   Pregnancy history fully reviewed. Patient does intend to breast feed. Patient obstetrical history is significant for obesity and vaginal deliveries, uncomplicated, x 2.  Sexual Activity and Vaginal Concerns: Patient is currently sexually active and denies pain or discomfort during intercourse.  She reports some vaginal pressure, discharge, and odor as well as some leaking.  She denies vaginal bleeding. Patient also denies pain or difficulty with urination.    Medical History/ROS: Patient denies medical history significant for cardiovascular, respiratory, gastrointestinal, or hematological disorders. Patient also denies history of MH disorders including anxiety and/or depression.   Patient reports  lower back pressure .  She states it is intermittent and finds it is present while trying to sleep. She reports position changes and heating pads with some relief. Patient denies constipation/diarrhea or vomiting.  She reports nausea that is relieved with Diclegis. No recurrent headaches. However, she does report a "pulse" on the right side of her head. She reports it is resolved with rest and hydration.    Social History: Patient denies history or current usage of tobacco, alcohol, or drugs.  Patient reports the FOB is Charlcie Cradle who is involved, supportive, and at home.  Patient reports that she lives with mother and two kids and endorses safety at home.  Patient denies DV/A. Patient is currently employed at [employer].  HISTORY: OB History  Gravida Para  Term Preterm AB Living  4 2 2  0 1 2  SAB IAB Ectopic Multiple Live Births  1 0 0 0 2    # Outcome Date GA Lbr Len/2nd Weight Sex Delivery Anes PTL Lv  4 Current           3 SAB 12/04/20 [redacted]w[redacted]d         2 Term 01/05/17 [redacted]w[redacted]d 06:25 / 00:03 8 lb 13.8 oz (4.02 kg) M Vag-Spont Local  LIV     Name: Lazare,BOY Bettey     Apgar1: 8  Apgar5: 9  1 Term 05/22/13 [redacted]w[redacted]d 09:35 / 00:18 7 lb 4.8 oz (3.311 kg) M Vag-Spont None  LIV     Name: Camba,BOY Negar     Apgar1: 8  Apgar5: 9    Last pap smear was done 04/2018 and was normal  No past medical history on file. Past Surgical History:  Procedure Laterality Date   NO PAST SURGERIES     Family History  Problem Relation Age of Onset   Heart murmur Mother    Hypertension Mother    Diabetes Father    Stroke Father    Heart disease Father    Heart failure Father    Arrhythmia Brother    Healthy Brother    Diabetes Maternal Grandfather    Heart disease Maternal Grandfather    Hypertension Maternal Grandfather    Heart attack Paternal Grandmother    Social History   Tobacco Use   Smoking status: Never   Smokeless tobacco: Never  Vaping Use   Vaping Use: Never used  Substance Use Topics   Alcohol use: Not Currently    Comment:  socially, before pregnancy   Drug use: No   Allergies  Allergen Reactions   Shellfish Allergy Swelling    Throat    Prednisone Other (See Comments)    "flutters"  bradycardia    Current Outpatient Medications on File Prior to Visit  Medication Sig Dispense Refill   albuterol (VENTOLIN HFA) 108 (90 Base) MCG/ACT inhaler Inhale into the lungs.     Doxylamine-Pyridoxine (DICLEGIS) 10-10 MG TBEC Take 2 tablets by mouth at bedtime. If symptoms persist, add one tablet in the morning and one in the afternoon 100 tablet 5   omeprazole (PRILOSEC) 20 MG capsule Take 1 capsule (20 mg total) by mouth daily. 30 capsule 1   Prenatal MV & Min w/FA-DHA (PRENATAL GUMMIES PO) Take by mouth.     promethazine (PHENERGAN) 25  MG tablet Take 1 tablet (25 mg total) by mouth every 6 (six) hours as needed for nausea or vomiting. (Patient not taking: Reported on 06/24/2021) 30 tablet 1   No current facility-administered medications on file prior to visit.    Review of Systems Pertinent items noted in HPI and remainder of comprehensive ROS otherwise negative.  Exam   Vitals:   07/19/21 0821  BP: 113/76  Pulse: 87  Weight: 277 lb (125.6 kg)      Physical Exam Constitutional:      Appearance: Normal appearance. She is obese.  Genitourinary:     Vulva normal.     Genitourinary Comments: NEFG: Vaginal vault pink with scant amt milky white discharge. Pap collected with brush and spatula. CV collected.      Vaginal discharge present.     No cervical discharge, friability, lesion, polyp or nabothian cyst.  Breasts:    Right: Normal.     Left: Normal.  HENT:     Head: Normocephalic and atraumatic.  Eyes:     Conjunctiva/sclera: Conjunctivae normal.  Cardiovascular:     Rate and Rhythm: Normal rate and regular rhythm.  Pulmonary:     Effort: Pulmonary effort is normal. No respiratory distress.     Breath sounds: Normal breath sounds.  Abdominal:     General: Bowel sounds are normal.     Tenderness: There is no abdominal tenderness.  Musculoskeletal:        General: Normal range of motion.     Cervical back: Normal range of motion.     Right lower leg: No edema.     Left lower leg: No edema.  Neurological:     Mental Status: She is alert and oriented to person, place, and time.  Skin:    General: Skin is dry.  Psychiatric:        Mood and Affect: Mood normal.        Behavior: Behavior normal.        Thought Content: Thought content normal.  Vitals reviewed. Exam conducted with a chaperone present.    Assessment:   31 y.o. year old [id number] Patient Active Problem List   Diagnosis Date Noted   Supervision of normal pregnancy in first trimester 06/24/2021     Plan:  1. Encounter for  supervision of other normal pregnancy in first trimester -Congratulations given and patient welcomed to practice. -Discussed usage of Babyscripts and virtual visits as additional source of managing and completing PN visits.   *Instructed to take blood pressure and record weekly into babyscripts. *Reviewed prenatal visit schedule and platforms used for virtual visits.  -Anticipatory guidance for prenatal visits including labs, ultrasounds, and testing; Initial labs  completed. -Genetic Screening discussed, First trimester screen and NIPS: ordered. -Encouraged to complete and utilize MyChart Registration for her ability to review results, send requests, and have questions addressed.  -Discussed estimated due date of Feb 03, 2022. -Ultrasound discussed; fetal anatomic survey: ordered. -Continue prenatal vitamins  -Influenza offered and declined. -Encouraged to seek out care at office or emergency room for urgent and/or emergent concerns. -Educated on the nature of [facility] with multiple MDs and other Advanced Practice Providers was explained to patient; also emphasized that residents, students are part of our team. Informed of her right to refuse care as she deems appropriate.  -No questions or concerns.   2. [redacted] weeks gestation of pregnancy -Doing well overall -Plan for AFP at [place] in 6-8 weeks.  3. Obesity in pregnancy BMI 40.91 today -Discussed weight gain between 11-20lbs for pregnancy. -Reviewed research and risk factors, AA and BMI >30, with recommendation for bASA usage. -Patient agreeable. -Instructed to start after 13 weeks and prescription dated to reflect.   4. Pelvic pressure in pregnancy, antepartum, first trimester -CV collected. -Discussed how BV and/or yeast could cause perception of vaginal pressure. -Exam without significant findings suggestive of either infection. -Exam did reveal uterus low within pelvis.  Encouraged usage of  maternity belt/band.  -Await results and treat accordingly.   5. Confirm fetal cardiac activity using ultrasound -BSUS confirms fetal movement. -FHR dopplered at 170      Problem list reviewed and updated. Routine obstetric precautions reviewed.  Orders Placed This Encounter  Procedures   Culture, OB Urine   [place] MFM OB DETAIL +14 WK    Standing Status:   Future    Standing Expiration Date:   01/19/2022    Order Specific Question:   Reason for Exam (SYMPTOM  OR DIAGNOSIS REQUIRED)    Answer:   Anatomy, Obese    Order Specific Question:   Preferred Location    Answer:   [facility]   Genetic Screening   CBC/D/Plt+RPR+Rh+ABO+RubIgG...   HgB A1c    Return in about 6 weeks (around 08/30/2021) for LROB with AFP.    Cherre Robins, CNM 07/19/2021 8:41 AM

## 2021-07-19 NOTE — Progress Notes (Signed)
NOB, c/o pressure ?

## 2021-07-20 DIAGNOSIS — O09899 Supervision of other high risk pregnancies, unspecified trimester: Secondary | ICD-10-CM | POA: Insufficient documentation

## 2021-07-20 DIAGNOSIS — Z2839 Other underimmunization status: Secondary | ICD-10-CM | POA: Insufficient documentation

## 2021-07-20 LAB — CBC/D/PLT+RPR+RH+ABO+RUBIGG...
Antibody Screen: NEGATIVE
Basophils Absolute: 0 10*3/uL (ref 0.0–0.2)
Basos: 0 %
EOS (ABSOLUTE): 0 10*3/uL (ref 0.0–0.4)
Eos: 1 %
HCV Ab: NONREACTIVE
HIV Screen 4th Generation wRfx: NONREACTIVE
Hematocrit: 34.8 % (ref 34.0–46.6)
Hemoglobin: 11.9 g/dL (ref 11.1–15.9)
Hepatitis B Surface Ag: NEGATIVE
Immature Grans (Abs): 0 10*3/uL (ref 0.0–0.1)
Immature Granulocytes: 0 %
Lymphocytes Absolute: 1.4 10*3/uL (ref 0.7–3.1)
Lymphs: 31 %
MCH: 30.6 pg (ref 26.6–33.0)
MCHC: 34.2 g/dL (ref 31.5–35.7)
MCV: 90 fL (ref 79–97)
Monocytes Absolute: 0.3 10*3/uL (ref 0.1–0.9)
Monocytes: 7 %
Neutrophils Absolute: 2.8 10*3/uL (ref 1.4–7.0)
Neutrophils: 61 %
Platelets: 294 10*3/uL (ref 150–450)
RBC: 3.89 x10E6/uL (ref 3.77–5.28)
RDW: 13.1 % (ref 11.7–15.4)
RPR Ser Ql: NONREACTIVE
Rh Factor: POSITIVE
Rubella Antibodies, IGG: <0.9 index — ABNORMAL LOW (ref >0.99)
WBC: 4.6 10*3/uL (ref 3.4–10.8)

## 2021-07-20 LAB — COMPREHENSIVE METABOLIC PANEL
ALT: 10 IU/L (ref 0–32)
AST: 16 IU/L (ref 0–40)
Albumin/Globulin Ratio: 1.4 (ref 1.2–2.2)
Albumin: 4.2 g/dL (ref 3.8–4.8)
Alkaline Phosphatase: 59 IU/L (ref 44–121)
BUN/Creatinine Ratio: 8 — ABNORMAL LOW (ref 9–23)
BUN: 6 mg/dL (ref 6–20)
Bilirubin Total: 0.4 mg/dL (ref 0.0–1.2)
CO2: 24 mmol/L (ref 20–29)
Calcium: 9.8 mg/dL (ref 8.7–10.2)
Chloride: 102 mmol/L (ref 96–106)
Creatinine, Ser: 0.71 mg/dL (ref 0.57–1.00)
Globulin, Total: 3.1 g/dL (ref 1.5–4.5)
Glucose: 95 mg/dL (ref 70–99)
Potassium: 4 mmol/L (ref 3.5–5.2)
Sodium: 138 mmol/L (ref 134–144)
Total Protein: 7.3 g/dL (ref 6.0–8.5)
eGFR: 117 mL/min/{1.73_m2} (ref >59)

## 2021-07-20 LAB — HEMOGLOBIN A1C
Est. average glucose Bld gHb Est-mCnc: 111 mg/dL
Hgb A1c MFr Bld: 5.5 % (ref 4.8–5.6)

## 2021-07-20 LAB — HCV INTERPRETATION

## 2021-07-21 LAB — URINE CULTURE, OB REFLEX

## 2021-07-21 LAB — CULTURE, OB URINE

## 2021-07-22 LAB — CERVICOVAGINAL ANCILLARY ONLY
Bacterial Vaginitis (gardnerella): POSITIVE — AB
Candida Glabrata: NEGATIVE
Candida Vaginitis: NEGATIVE
Chlamydia: NEGATIVE
Comment: NEGATIVE
Comment: NEGATIVE
Comment: NEGATIVE
Comment: NEGATIVE
Comment: NEGATIVE
Comment: NORMAL
Neisseria Gonorrhea: NEGATIVE
Trichomonas: NEGATIVE

## 2021-07-22 LAB — CYTOLOGY - PAP
Comment: NEGATIVE
Diagnosis: NEGATIVE
High risk HPV: NEGATIVE

## 2021-07-23 MED ORDER — METRONIDAZOLE 0.75 % VA GEL: 1.0000 | Freq: Every day | VAGINAL | 0 refills | Status: DC

## 2021-07-23 NOTE — Addendum Note (Signed)
Addended by: Gerrit Heck L on: 07/23/2021 12:04 PM ? ? Modules accepted: Orders ? ?

## 2021-07-23 NOTE — Progress Notes (Signed)
Patient was assessed and managed by nursing staff during this encounter. I have reviewed the chart and agree with the documentation and plan. I have also made any necessary editorial changes.  Scheryl Darter, MD 07/23/2021 1:36 PM

## 2021-08-19 ENCOUNTER — Encounter: Payer: Self-pay | Admitting: *Deleted

## 2021-08-30 ENCOUNTER — Encounter: Payer: Self-pay | Admitting: Obstetrics

## 2021-08-30 ENCOUNTER — Ambulatory Visit (INDEPENDENT_AMBULATORY_CARE_PROVIDER_SITE_OTHER): Payer: POS

## 2021-08-30 VITALS — BP 113/69 | HR 88 | Wt 276.0 lb

## 2021-08-30 DIAGNOSIS — Z3480 Encounter for supervision of other normal pregnancy, unspecified trimester: Secondary | ICD-10-CM

## 2021-08-30 DIAGNOSIS — O99212 Obesity complicating pregnancy, second trimester: Secondary | ICD-10-CM

## 2021-08-30 DIAGNOSIS — Z3481 Encounter for supervision of other normal pregnancy, first trimester: Secondary | ICD-10-CM

## 2021-08-30 DIAGNOSIS — O99210 Obesity complicating pregnancy, unspecified trimester: Secondary | ICD-10-CM

## 2021-08-30 DIAGNOSIS — Z3A17 17 weeks gestation of pregnancy: Secondary | ICD-10-CM

## 2021-08-30 NOTE — Progress Notes (Signed)
? ?  LOW-RISK PREGNANCY OFFICE VISIT ? ?Patient name: Jamie [name] MRN [id number]  Date of birth: Sep 20, 1990 ?Chief Complaint:   ?Routine Prenatal Visit ? ?Subjective:   ?Jamie [name] is a 31 y.o. [phone] female at [redacted]w[redacted]d with an Estimated Date of Delivery: 02/03/22 being seen today for ongoing management of a low-risk pregnancy aeb has Supervision of normal pregnancy in first trimester; Maternal morbid obesity, antepartum (HCC); and Rubella non-immune status, antepartum on their problem list. ? ?Patient presents today, alone, with no complaints.  Patient reports some  abdominal cramping  that occurs at night time. Patient questions if genetic testing is accurate.  Patient denies vaginal concerns including abnormal discharge, leaking of fluid, and bleeding.  No issues with urination, constipation, or diarrhea.  ?Contractions: Not present. Vag. Bleeding: None.   . ? ?Reviewed past medical,surgical, social, obstetrical and family history as well as problem list, medications and allergies. ? ?Objective  ? ?Vitals:  ? 08/30/21 0904  ?BP: 113/69  ?Pulse: 88  ?Weight: 276 lb (125.2 kg)  ?Body mass index is 40.76 kg/m?.  ?Total Weight Gain:0 lb (0 kg) ? ?  ?     Physical Examination:  ? General appearance: Well appearing, and in no distress ? Mental status: Alert, oriented to person, place, and time ? Skin: Warm & dry ? Cardiovascular: Normal heart rate noted ? Respiratory: Normal respiratory effort, no distress ? Abdomen: Not assessed ? Pelvic: Cervical exam deferred          ? Extremities: Edema: None ? ?Fetal Status: Fetal Heart Rate (bpm): 154     ? ?No results found for this or any previous visit (from the past 24 hour(s)).  ?Assessment & Plan:  ?Low-risk pregnancy of a 31 y.o., [id number] at [redacted]w[redacted]d with an Estimated Date of Delivery: 02/03/22  ? ?1. Supervision of other normal pregnancy, antepartum ?-Anticipatory guidance for upcoming appts. ?-Patient to schedule next appt in 4-5 weeks for an in-person or virtual  visit. ?-Informed that she would have to have bp cuff prior to virtual visit.  ?-Reviewed upcoming [place] appt for May 9th.  ?-Expecting girl. Reassured that Cindy Hazy is accurate! ?-Congratulations given! ? ?2. [redacted] weeks gestation of pregnancy ?-Doing well. ?-Reviewed cramping. Informed that reassuring considering at end of night. ?-Encouraged proper hydration throughout the day and continued monitoring.  ?-AFP ordered today: educated on what it is. ? ?3. Maternal morbid obesity, antepartum (HCC) ?-Patient reports she is not taking baby aspirin as she has decided against it. ?-No weight gain ? ? ?  ?Meds: No orders of the defined types were placed in this encounter. ? ?Labs/procedures today:  ?Lab Orders    ?     AFP, Serum, Open Spina Bifida     ? ?Reviewed: Preterm labor symptoms and general obstetric precautions including but not limited to vaginal bleeding, contractions, leaking of fluid and fetal movement were reviewed in detail with the patient.  All questions were answered. ? ?Follow-up: No follow-ups on file. ? ?Orders Placed This Encounter  ?Procedures  ? AFP, Serum, Open Spina Bifida  ? ?Cherre Robins MSN, CNM ?08/30/2021 ? ?

## 2021-09-04 LAB — AFP, SERUM, OPEN SPINA BIFIDA
AFP MoM: 0.87
AFP Value: 28.7 ng/mL
Gest. Age on Collection Date: 17.6 weeks
Maternal Age At EDD: 32.5 yr
OSBR Risk 1 IN: 10000
Test Results:: NEGATIVE
Weight: 276 [lb_av]

## 2021-09-10 ENCOUNTER — Ambulatory Visit: Payer: POS | Admitting: *Deleted

## 2021-09-10 ENCOUNTER — Other Ambulatory Visit: Payer: Self-pay | Admitting: *Deleted

## 2021-09-10 ENCOUNTER — Encounter: Payer: Self-pay | Admitting: *Deleted

## 2021-09-10 ENCOUNTER — Ambulatory Visit: Payer: POS

## 2021-09-10 VITALS — BP 115/57 | HR 83

## 2021-09-10 DIAGNOSIS — O99210 Obesity complicating pregnancy, unspecified trimester: Secondary | ICD-10-CM | POA: Diagnosis not present

## 2021-09-10 DIAGNOSIS — Z2839 Other underimmunization status: Secondary | ICD-10-CM | POA: Insufficient documentation

## 2021-09-10 DIAGNOSIS — O99212 Obesity complicating pregnancy, second trimester: Secondary | ICD-10-CM | POA: Diagnosis not present

## 2021-09-10 DIAGNOSIS — Z363 Encounter for antenatal screening for malformations: Secondary | ICD-10-CM | POA: Diagnosis not present

## 2021-09-10 DIAGNOSIS — Z3490 Encounter for supervision of normal pregnancy, unspecified, unspecified trimester: Secondary | ICD-10-CM

## 2021-09-10 DIAGNOSIS — Z3481 Encounter for supervision of other normal pregnancy, first trimester: Secondary | ICD-10-CM | POA: Diagnosis not present

## 2021-09-10 DIAGNOSIS — Z3491 Encounter for supervision of normal pregnancy, unspecified, first trimester: Secondary | ICD-10-CM

## 2021-09-10 DIAGNOSIS — Z3A19 19 weeks gestation of pregnancy: Secondary | ICD-10-CM | POA: Diagnosis not present

## 2021-09-10 DIAGNOSIS — O09899 Supervision of other high risk pregnancies, unspecified trimester: Secondary | ICD-10-CM | POA: Insufficient documentation

## 2021-09-25 ENCOUNTER — Ambulatory Visit (INDEPENDENT_AMBULATORY_CARE_PROVIDER_SITE_OTHER): Payer: POS | Admitting: Student

## 2021-09-25 VITALS — BP 105/60 | HR 85 | Wt 277.8 lb

## 2021-09-25 DIAGNOSIS — Z3491 Encounter for supervision of normal pregnancy, unspecified, first trimester: Secondary | ICD-10-CM

## 2021-09-25 DIAGNOSIS — Z3A21 21 weeks gestation of pregnancy: Secondary | ICD-10-CM

## 2021-09-25 DIAGNOSIS — O99210 Obesity complicating pregnancy, unspecified trimester: Secondary | ICD-10-CM

## 2021-09-25 NOTE — Progress Notes (Signed)
   PRENATAL VISIT NOTE  Subjective:  Jamie [name] is a 31 y.o. [id number] at [redacted]w[redacted]d being seen today for ongoing prenatal care.  She is currently monitored for the following issues for this low-risk pregnancy and has Supervision of normal pregnancy in first trimester; Maternal morbid obesity, antepartum ([place]); and Rubella non-immune status, antepartum on their problem list.  Patient reports mild headache. Also states that she has not been resting as much as she should be. Patient would prefer to avoid any medical management at this time. Denies any blurred vision, dizziness, RUQ pain or new swelling. Contractions: Not present. Vag. Bleeding: None.  Movement: Present. Denies leaking of fluid.   The following portions of the patient's history were reviewed and updated as appropriate: allergies, current medications, past family history, past medical history, past social history, past surgical history and problem list.   Objective:   Vitals:   09/25/21 1547  BP: 105/60  Pulse: 85  Weight: 277 lb 12.8 oz (126 kg)    Fetal Status: Fetal Heart Rate (bpm): 156   Movement: Present     General:  Alert, oriented and cooperative. Patient is in no acute distress.  Skin: Skin is warm and dry. No rash noted.   Cardiovascular: Normal heart rate noted  Respiratory: Normal respiratory effort, no problems with respiration noted  Abdomen: Soft, gravid, appropriate for gestational age.  Pain/Pressure: Absent     Pelvic: Cervical exam deferred        Extremities: Normal range of motion.  Edema: None  Mental Status: Normal mood and affect. Normal behavior. Normal judgment and thought content.   Assessment and Plan:  Pregnancy: [id number] at [redacted]w[redacted]d 1. Encounter for supervision of normal pregnancy in first trimester, unspecified gravidity - Overall doing well, good fetal movement -Patient to follow-up in approx. 4 weeks for routine care -Anticipatory guidance provided -Encouraged rest and hydration  2. [redacted] weeks  gestation of pregnancy 3. Maternal morbid obesity - Declined baby aspirin - Growth Scan scheduled around 24 weeks  Preterm labor symptoms and general obstetric precautions including but not limited to vaginal bleeding, contractions, leaking of fluid and fetal movement were reviewed in detail with the patient. Please refer to After Visit Summary for other counseling recommendations.   Return in about 4 weeks (around 10/23/2021) for LOB, IN-PERSON.  Future Appointments  Date Time Provider [place]  10/09/2021 11:15 AM [facility] NURSE [facility]  10/09/2021 11:30 AM [facility] US2 [facility]  10/23/2021  8:35 AM Johnston Ebbs, NP [facility] None    Johnston Ebbs, NP

## 2021-09-25 NOTE — Progress Notes (Signed)
Pt states that she has been feeling fetal movement but not as much today. Pt reports an intermittent pain in upper left abdomen that started yesterday.

## 2021-10-09 ENCOUNTER — Ambulatory Visit: Payer: POS | Attending: Maternal & Fetal Medicine

## 2021-10-09 ENCOUNTER — Other Ambulatory Visit: Payer: Self-pay | Admitting: *Deleted

## 2021-10-09 ENCOUNTER — Ambulatory Visit: Payer: POS | Admitting: *Deleted

## 2021-10-09 ENCOUNTER — Encounter: Payer: Self-pay | Admitting: *Deleted

## 2021-10-09 VITALS — BP 108/62 | HR 68

## 2021-10-09 DIAGNOSIS — Z6841 Body Mass Index (BMI) 40.0 and over, adult: Secondary | ICD-10-CM | POA: Insufficient documentation

## 2021-10-09 DIAGNOSIS — O99212 Obesity complicating pregnancy, second trimester: Secondary | ICD-10-CM | POA: Diagnosis not present

## 2021-10-09 DIAGNOSIS — Z3A23 23 weeks gestation of pregnancy: Secondary | ICD-10-CM | POA: Diagnosis not present

## 2021-10-09 DIAGNOSIS — E669 Obesity, unspecified: Secondary | ICD-10-CM | POA: Diagnosis not present

## 2021-10-23 ENCOUNTER — Encounter: Payer: Self-pay | Admitting: Student

## 2021-10-23 ENCOUNTER — Ambulatory Visit (INDEPENDENT_AMBULATORY_CARE_PROVIDER_SITE_OTHER): Payer: POS | Admitting: Student

## 2021-10-23 VITALS — BP 125/74 | HR 96 | Wt 282.2 lb

## 2021-10-23 DIAGNOSIS — Z3482 Encounter for supervision of other normal pregnancy, second trimester: Secondary | ICD-10-CM

## 2021-10-23 DIAGNOSIS — O09899 Supervision of other high risk pregnancies, unspecified trimester: Secondary | ICD-10-CM

## 2021-10-23 DIAGNOSIS — Z3A25 25 weeks gestation of pregnancy: Secondary | ICD-10-CM

## 2021-10-23 DIAGNOSIS — Z2839 Other underimmunization status: Secondary | ICD-10-CM

## 2021-10-23 NOTE — Progress Notes (Signed)
Pt presents for ROB visit with no concerns at this time.  

## 2021-10-23 NOTE — Progress Notes (Signed)
PRENATAL VISIT NOTE  Subjective:  Jamie [name] is a 31 y.o. [id number] at 70w2dbeing seen today for ongoing prenatal care.  She is currently monitored for the following issues for this low-risk pregnancy and has Supervision of normal pregnancy in first trimester; Maternal morbid obesity, antepartum ([place]; and Rubella non-immune status, antepartum on their problem list.  Patient reports no complaints.  Contractions: Not present. Vag. Bleeding: None.  Movement: Present. Denies leaking of fluid.   The following portions of the patient's history were reviewed and updated as appropriate: allergies, current medications, past family history, past medical history, past social history, past surgical history and problem list.   Objective:   Vitals:   10/23/21 0831  BP: 125/74  Pulse: 96  Weight: 282 lb 3.2 oz (128 kg)    Fetal Status: Fetal Heart Rate (bpm): 147 Fundal Height: 26 cm Movement: Present     General:  Alert, oriented and cooperative. Patient is in no acute distress.  Skin: Skin is warm and dry. No rash noted.   Cardiovascular: Normal heart rate noted  Respiratory: Normal respiratory effort, no problems with respiration noted  Abdomen: Soft, gravid, appropriate for gestational age.  Pain/Pressure: Present     Pelvic: Cervical exam deferred        Extremities: Normal range of motion.  Edema: None  Mental Status: Normal mood and affect. Normal behavior. Normal judgment and thought content.   Assessment and Plan:  Pregnancy: [id number] 259w2d. Encounter for supervision of other normal pregnancy in second trimester - doing well, feeling good fetal movement -anticipatory guidance provided - F/U growth [place] per MFM   2. [redacted] weeks gestation of pregnancy - 3rd trimester labs at next visit  3. Rubella non-immune status, antepartum -MMR postpartum  Preterm labor symptoms and general obstetric precautions including but not limited to vaginal bleeding, contractions, leaking  of fluid and fetal movement were reviewed in detail with the patient. Please refer to After Visit Summary for other counseling recommendations.   Return in about 3 weeks (around 11/13/2021) for LOB/GTT, IN-PERSON.  Future Appointments  Date Time Provider [place]7/10/2021  8:[facility]10[facility]2021  8:45 AM [facility] US4 [facility] [place]  NiJohnston EbbsNP

## 2021-11-07 ENCOUNTER — Ambulatory Visit: Payer: POS | Attending: Obstetrics

## 2021-11-07 ENCOUNTER — Other Ambulatory Visit: Payer: Self-pay | Admitting: *Deleted

## 2021-11-07 ENCOUNTER — Ambulatory Visit: Payer: POS | Admitting: *Deleted

## 2021-11-07 ENCOUNTER — Encounter: Payer: Self-pay | Admitting: *Deleted

## 2021-11-07 VITALS — BP 123/62 | HR 90

## 2021-11-07 DIAGNOSIS — Z6841 Body Mass Index (BMI) 40.0 and over, adult: Secondary | ICD-10-CM

## 2021-11-07 DIAGNOSIS — Z3A27 27 weeks gestation of pregnancy: Secondary | ICD-10-CM | POA: Diagnosis not present

## 2021-11-07 DIAGNOSIS — O99212 Obesity complicating pregnancy, second trimester: Secondary | ICD-10-CM | POA: Diagnosis present

## 2021-11-07 DIAGNOSIS — E6609 Other obesity due to excess calories: Secondary | ICD-10-CM | POA: Diagnosis not present

## 2021-11-07 DIAGNOSIS — Z3689 Encounter for other specified antenatal screening: Secondary | ICD-10-CM

## 2021-11-13 ENCOUNTER — Encounter: Payer: Self-pay | Admitting: Family Medicine

## 2021-11-13 ENCOUNTER — Ambulatory Visit (INDEPENDENT_AMBULATORY_CARE_PROVIDER_SITE_OTHER): Payer: POS | Admitting: Family Medicine

## 2021-11-13 ENCOUNTER — Other Ambulatory Visit: Payer: POS

## 2021-11-13 VITALS — BP 111/79 | HR 94 | Wt 283.7 lb

## 2021-11-13 DIAGNOSIS — Z2839 Other underimmunization status: Secondary | ICD-10-CM

## 2021-11-13 DIAGNOSIS — O09899 Supervision of other high risk pregnancies, unspecified trimester: Secondary | ICD-10-CM

## 2021-11-13 DIAGNOSIS — O99210 Obesity complicating pregnancy, unspecified trimester: Secondary | ICD-10-CM

## 2021-11-13 DIAGNOSIS — Z3403 Encounter for supervision of normal first pregnancy, third trimester: Secondary | ICD-10-CM

## 2021-11-13 NOTE — Progress Notes (Signed)
   PRENATAL VISIT NOTE  Subjective:  Jamie [name] is a 31 y.o. G4P2012 at [redacted]w[redacted]d being seen today for ongoing prenatal care.  She is currently monitored for the following issues for this low-risk pregnancy and has Encounter for supervision of normal first pregnancy in third trimester; Maternal morbid obesity, antepartum (HCC); and Rubella non-immune status, antepartum on their problem list.  Patient reports no complaints.  Contractions: Not present. Vag. Bleeding: None.  Movement: Present. Denies leaking of fluid.   The following portions of the patient's history were reviewed and updated as appropriate: allergies, current medications, past family history, past medical history, past social history, past surgical history and problem list.   Objective:   Vitals:   11/13/21 0821  BP: 111/79  Pulse: 94  Weight: 283 lb 11.2 oz (128.7 kg)    Fetal Status: Fetal Heart Rate (bpm): 147   Movement: Present     General:  Alert, oriented and cooperative. Patient is in no acute distress.  Skin: Skin is warm and dry. No rash noted.   Cardiovascular: Normal heart rate noted  Respiratory: Normal respiratory effort, no problems with respiration noted  Abdomen: Soft, gravid, appropriate for gestational age.  Pain/Pressure: Present     Pelvic: Cervical exam deferred        Extremities: Normal range of motion.  Edema: None  Mental Status: Normal mood and affect. Normal behavior. Normal judgment and thought content.   Assessment and Plan:  Pregnancy: G4P2012 at [redacted]w[redacted]d 1. Encounter for supervision of normal first pregnancy in third trimester FHT and FH normal Note given for work - no heavy lifting >25#. - Glucose Tolerance, 2 Hours w/1 Hour - CBC - HIV Antibody (routine testing w rflx) - RPR  2. Maternal morbid obesity, antepartum (HCC) On ASA 81mg  3. Rubella non-immune status, antepartum MMR post delivery  Preterm labor symptoms and general obstetric precautions including but not limited to  vaginal bleeding, contractions, leaking of fluid and fetal movement were reviewed in detail with the patient. Please refer to After Visit Summary for other counseling recommendations.   Return in about 2 weeks (around 11/27/2021) for LR OB f/u.  Future Appointments  Date Time Provider Department Center  11/13/2021  8:55 AM ,  J, DO [facility] None  12/04/2021 12:30 PM [facility] NURSE [facility]  12/04/2021 12:45 PM [facility] US4 [facility]     J , DO  

## 2021-11-13 NOTE — Progress Notes (Signed)
Pt presents for 2 gtt labs without complaints.  Tdap declined PHQ9=0 GAD7=0

## 2021-11-14 LAB — GLUCOSE TOLERANCE, 2 HOURS W/ 1HR
Glucose, 1 hour: 136 mg/dL (ref 70–179)
Glucose, 2 hour: 100 mg/dL (ref 70–152)
Glucose, Fasting: 76 mg/dL (ref 70–91)

## 2021-11-14 LAB — CBC
Hematocrit: 32.5 % — ABNORMAL LOW (ref 34.0–46.6)
Hemoglobin: 10.8 g/dL — ABNORMAL LOW (ref 11.1–15.9)
MCH: 30.7 pg (ref 26.6–33.0)
MCHC: 33.2 g/dL (ref 31.5–35.7)
MCV: 92 fL (ref 79–97)
Platelets: 264 10*3/uL (ref 150–450)
RBC: 3.52 x10E6/uL — ABNORMAL LOW (ref 3.77–5.28)
RDW: 13.1 % (ref 11.7–15.4)
WBC: 6.5 10*3/uL (ref 3.4–10.8)

## 2021-11-14 LAB — RPR: RPR Ser Ql: NONREACTIVE

## 2021-11-14 LAB — HIV ANTIBODY (ROUTINE TESTING W REFLEX): HIV Screen 4th Generation wRfx: NONREACTIVE

## 2021-11-16 ENCOUNTER — Inpatient Hospital Stay (HOSPITAL_COMMUNITY)
Admission: AD | Admit: 2021-11-16 | Discharge: 2021-11-16 | Disposition: A | Discharge status: Home / Self Care | Payer: POS | Attending: Obstetrics and Gynecology | Admitting: Obstetrics and Gynecology

## 2021-11-16 ENCOUNTER — Other Ambulatory Visit: Payer: Self-pay

## 2021-11-16 ENCOUNTER — Encounter (HOSPITAL_COMMUNITY): Payer: Self-pay | Admitting: Obstetrics and Gynecology

## 2021-11-16 DIAGNOSIS — Z3689 Encounter for other specified antenatal screening: Secondary | ICD-10-CM | POA: Diagnosis not present

## 2021-11-16 DIAGNOSIS — O26893 Other specified pregnancy related conditions, third trimester: Secondary | ICD-10-CM | POA: Insufficient documentation

## 2021-11-16 DIAGNOSIS — M7918 Myalgia, other site: Secondary | ICD-10-CM | POA: Insufficient documentation

## 2021-11-16 DIAGNOSIS — R109 Unspecified abdominal pain: Secondary | ICD-10-CM | POA: Diagnosis not present

## 2021-11-16 DIAGNOSIS — Z3403 Encounter for supervision of normal first pregnancy, third trimester: Secondary | ICD-10-CM

## 2021-11-16 DIAGNOSIS — O42913 Preterm premature rupture of membranes, unspecified as to length of time between rupture and onset of labor, third trimester: Secondary | ICD-10-CM | POA: Diagnosis not present

## 2021-11-16 DIAGNOSIS — Z3493 Encounter for supervision of normal pregnancy, unspecified, third trimester: Secondary | ICD-10-CM

## 2021-11-16 DIAGNOSIS — Z0371 Encounter for suspected problem with amniotic cavity and membrane ruled out: Secondary | ICD-10-CM | POA: Diagnosis not present

## 2021-11-16 DIAGNOSIS — Z2839 Other underimmunization status: Secondary | ICD-10-CM

## 2021-11-16 DIAGNOSIS — Z3A28 28 weeks gestation of pregnancy: Secondary | ICD-10-CM | POA: Insufficient documentation

## 2021-11-16 LAB — URINALYSIS, ROUTINE W REFLEX MICROSCOPIC
Bilirubin Urine: NEGATIVE
Glucose, UA: 150 mg/dL — AB
Hgb urine dipstick: NEGATIVE
Ketones, ur: NEGATIVE mg/dL
Nitrite: NEGATIVE
Protein, ur: NEGATIVE mg/dL
Specific Gravity, Urine: 1.016 (ref 1.005–1.030)
pH: 7 (ref 5.0–8.0)

## 2021-11-16 LAB — WET PREP, GENITAL
Clue Cells Wet Prep HPF POC: NONE SEEN
Sperm: NONE SEEN
Trich, Wet Prep: NONE SEEN
WBC, Wet Prep HPF POC: >=10 — AB (ref <10)
Yeast Wet Prep HPF POC: NONE SEEN

## 2021-11-16 LAB — AMNISURE RUPTURE OF MEMBRANE (ROM) NOT AT ARMC: Amnisure ROM: NEGATIVE

## 2021-11-16 NOTE — MAU Provider Note (Signed)
History     CSN: [id number]  Arrival date and time: 11/16/21 1057   Event Date/Time   First Provider Initiated Contact with Patient 11/16/21 1146      Chief Complaint  Patient presents with   Abdominal Pain   Rupture of Membranes   HPI Jamie [name] is a 31 y.o. [id number] at [redacted]w[redacted]d who presents to MAU with chief complaint of leaking fluid and concern for PPROM. Patient endorses leaking clear fluid all day today. She denies feeling a gush of fluid at onset of complaint. She is remote from sexual intercourse.  Patient also reports pelvic pressure and abdominal pain. These are new problems, onset four days ago and worsening overnight.  Patient works at the [employer] and has been moved to a new station that is significantly more physically demanding. She is now lifting, twisting and bending while standing on a concrete floor for her entire shift.   Patient requests work note to help her find assignments which are less physically demanding.  OB History     Gravida  4   Para  2   Term  2   Preterm      AB  1   Living  2      SAB  1   IAB      Ectopic      Multiple  0   Live Births  2           History reviewed. No pertinent past medical history.  Past Surgical History:  Procedure Laterality Date   NO PAST SURGERIES      Family History  Problem Relation Age of Onset   Heart murmur Mother    Hypertension Mother    Diabetes Father    Stroke Father    Heart disease Father    Heart failure Father    Arrhythmia Brother    Healthy Brother    Diabetes Maternal Grandfather    Heart disease Maternal Grandfather    Hypertension Maternal Grandfather    Heart attack Paternal Grandmother     Social History   Tobacco Use   Smoking status: Never   Smokeless tobacco: Never  Vaping Use   Vaping Use: Never used  Substance Use Topics   Alcohol use: Not Currently    Comment: socially, before pregnancy   Drug use: No    Allergies:  Allergies  Allergen  Reactions   Shellfish Allergy Swelling    Throat    Prednisone Other (See Comments)    "flutters"  bradycardia     Medications Prior to Admission  Medication Sig Dispense Refill Last Dose   aspirin EC 81 MG tablet Take 1 tablet (81 mg total) by mouth daily. 60 tablet 2 11/15/2021   ferrous sulfate 325 (65 FE) MG tablet Take 325 mg by mouth daily with breakfast.   11/16/2021   Prenatal MV & Min w/FA-DHA (PRENATAL GUMMIES PO) Take by mouth.   11/15/2021    Review of Systems  Gastrointestinal:  Positive for abdominal pain.  Genitourinary:  Positive for pelvic pain and vaginal discharge.  Musculoskeletal:  Positive for back pain.  All other systems reviewed and are negative.  Physical Exam   Blood pressure 119/69, pulse 94, temperature 98.4 F (36.9 C), temperature source Oral, resp. rate 19, height 5\' 10"  (1.778 m), weight 129 kg, last menstrual period 04/06/2021, SpO2 99 %.  Physical Exam Vitals and nursing note reviewed. Exam conducted with a chaperone present.  Constitutional:  Appearance: She is obese.  Cardiovascular:     Rate and Rhythm: Normal rate and regular rhythm.     Heart sounds: Normal heart sounds.  Pulmonary:     Effort: Pulmonary effort is normal.     Breath sounds: Normal breath sounds.  Abdominal:     Tenderness: There is no abdominal tenderness. There is no right CVA tenderness.     Comments: Gravid  Genitourinary:    Comments: Pelvic exam: External genitalia normal, vaginal walls pink and well rugated, cervix visually closed, no lesions noted. Scant white discharge potentially physiologic   Neurological:     Mental Status: She is alert.     MAU Course  Procedures  MDM --OBIX malfunction in room, paper tracing initiated.  --Reactive tracing: baseline 135, mod var, + accels, no decels --Toco: quiet --Intact amniotic sac: negative pooling, negative fern, negative Amnisure --Discussed non-pharmacologic interventions for pain including  mindfulness, distraction, aromatherapy, warm compresses, warm bath and floating in pool  Orders Placed This Encounter  Procedures   Wet prep, genital   Urinalysis, Routine w reflex microscopic Urine, Clean Catch   Amnisure rupture of membrane (rom)not at [facility]   Discharge patient   Patient Vitals for the past 24 hrs:  BP Temp Temp src Pulse Resp SpO2 Height Weight  11/16/21 1112 119/69 98.4 F (36.9 C) Oral 94 19 99 % -- --  11/16/21 1107 -- -- -- -- -- -- 5\' 10"  (1.778 m) 129 kg   Results for orders placed or performed during the hospital encounter of 11/16/21 (from the past 24 hour(s))  Urinalysis, Routine w reflex microscopic Urine, Clean Catch     Status: Abnormal   Collection Time: 11/16/21 11:19 AM  Result Value Ref Range   Color, Urine YELLOW YELLOW   APPearance HAZY (A) CLEAR   Specific Gravity, Urine 1.016 1.005 - 1.030   pH 7.0 5.0 - 8.0   Glucose, UA 150 (A) NEGATIVE mg/dL   Hgb urine dipstick NEGATIVE NEGATIVE   Bilirubin Urine NEGATIVE NEGATIVE   Ketones, ur NEGATIVE NEGATIVE mg/dL   Protein, ur NEGATIVE NEGATIVE mg/dL   Nitrite NEGATIVE NEGATIVE   Leukocytes,Ua SMALL (A) NEGATIVE   RBC / HPF 0-5 0 - 5 RBC/hpf   WBC, UA 0-5 0 - 5 WBC/hpf   Bacteria, UA RARE (A) NONE SEEN   Squamous Epithelial / LPF 0-5 0 - 5   Mucus PRESENT    Amorphous Crystal PRESENT   Wet prep, genital     Status: Abnormal   Collection Time: 11/16/21 12:02 PM  Result Value Ref Range   Yeast Wet Prep HPF POC NONE SEEN NONE SEEN   Trich, Wet Prep NONE SEEN NONE SEEN   Clue Cells Wet Prep HPF POC NONE SEEN NONE SEEN   WBC, Wet Prep HPF POC >=10 (A) <10   Sperm NONE SEEN   Amnisure rupture of membrane (rom)not at [facility]     Status: None   Collection Time: 11/16/21 12:02 PM  Result Value Ref Range   Amnisure ROM NEGATIVE    Assessment and Plan  --31 y.o. 34 at [redacted]w[redacted]d --Reactive tracing --Closed cervix --Intact amniotic sac --Musculoskeletal pain in third trimester --Given light duty  note --Give note to stay home from work to rest today and tomorrow --Patient declined pain medication throughout encounter and at discharge --Discharge home in stable condition  [redacted]w[redacted]d, MSA, MSN, CNM 11/16/2021, 3:00 PM

## 2021-11-16 NOTE — MAU Note (Signed)
Jamie [name] is a 31 y.o. at [redacted]w[redacted]d here in MAU reporting: pelvic pressure x4 days.  Reports not having any ctxs.  Denies VB, endorses leaking clear fluid, doesn't require pad or panty liner.  Reports +FM.  Onset of complaint: 4 days Pain score: 8/10 Vitals:   11/16/21 1112  BP: 119/69  Pulse: 94  Resp: 19  Temp: 98.4 F (36.9 C)  SpO2: 99%     FHT: 155 bpm Lab orders placed from triage:   UA

## 2021-11-16 NOTE — Discharge Instructions (Signed)

## 2021-11-18 LAB — GC/CHLAMYDIA PROBE AMP (~~LOC~~) NOT AT ARMC
Chlamydia: NEGATIVE
Comment: NEGATIVE
Comment: NORMAL
Neisseria Gonorrhea: NEGATIVE

## 2021-12-03 ENCOUNTER — Ambulatory Visit (INDEPENDENT_AMBULATORY_CARE_PROVIDER_SITE_OTHER): Payer: POS | Admitting: Family Medicine

## 2021-12-03 ENCOUNTER — Encounter: Payer: Self-pay | Admitting: Family Medicine

## 2021-12-03 VITALS — BP 128/71 | HR 85 | Wt 282.8 lb

## 2021-12-03 DIAGNOSIS — Z2839 Other underimmunization status: Secondary | ICD-10-CM

## 2021-12-03 DIAGNOSIS — Z3403 Encounter for supervision of normal first pregnancy, third trimester: Secondary | ICD-10-CM

## 2021-12-03 DIAGNOSIS — Z3A31 31 weeks gestation of pregnancy: Secondary | ICD-10-CM

## 2021-12-03 DIAGNOSIS — O99210 Obesity complicating pregnancy, unspecified trimester: Secondary | ICD-10-CM

## 2021-12-03 DIAGNOSIS — O09899 Supervision of other high risk pregnancies, unspecified trimester: Secondary | ICD-10-CM

## 2021-12-03 NOTE — Progress Notes (Signed)
PRENATAL VISIT NOTE  Subjective:  Jamie [name] is a 31 y.o. G3O7564 at 52w1dbeing seen today for ongoing prenatal care.  She is currently monitored for the following issues for this low-risk pregnancy and has Encounter for supervision of normal first pregnancy in third trimester; Maternal morbid obesity, antepartum ([place]; and Rubella non-immune status, antepartum on their problem list.  Patient reports no complaints.  Contractions: Not present. Vag. Bleeding: None.  Movement: Present. Denies leaking of fluid.   The following portions of the patient's history were reviewed and updated as appropriate: allergies, current medications, past family history, past medical history, past social history, past surgical history and problem list.   Objective:   Vitals:   12/03/21 1445  BP: 128/71  Pulse: 85  Weight: 282 lb 12.8 oz (128.3 kg)    Fetal Status:     Movement: Present     General:  Alert, oriented and cooperative. Patient is in no acute distress.  Skin: Skin is warm and dry. No rash noted.   Cardiovascular: Normal heart rate noted  Respiratory: Normal respiratory effort, no problems with respiration noted  Abdomen: Soft, gravid, appropriate for gestational age.  Pain/Pressure: Present     Pelvic: Cervical exam deferred        Extremities: Normal range of motion.  Edema: None  Mental Status: Normal mood and affect. Normal behavior. Normal judgment and thought content.   Assessment and Plan:  Pregnancy: [id number] [redacted]w[redacted]d. Encounter for supervision of normal first pregnancy in third trimester  Nursing Staff Provider  Office Location  [facility] Dating    [facility] [XValu.Nieves Traditional '[ ]'  Centering '[ ]'  Mom-Baby Dyad    Language  English Anatomy [place]  Flu Vaccine  Declined 06/24/21 Genetic/Carrier Screen  NIPS:  LR female AFP:   LR Horizon: Negative  TDaP Vaccine   Declined 11/13/21 Hgb A1C or  GTT Early  5.5 Third trimester   COVID Vaccine Completed   LAB RESULTS   Rhogam    Blood Type O/Positive/-- (03/17 0926)   Baby Feeding Plan Breast Antibody Negative (03/17 0926)  Contraception Paraguard [name] Rubella <0.90 (03/17 0926)  Circumcision n/a RPR Non Reactive (03/17 0926)   Pediatrician  [facility] HBsAg Negative (03/17 0926)   Support Person Mom and FOB HCVAb   Prenatal Classes no HIV Non Reactive (03/17 0926)     BTL Consent  GBS   (For PCN allergy, check sensitivities)   VBAC Consent  Pap        DME Rx [XValu.Nieves BP cuff [XValu.Nieves Weight Scale Waterbirth  '[ ]'  Class '[ ]'  Consent '[ ]'  CNM visit  PHQ9 & GAD7 [XValu.Nieves] new OB [ x ] 28 weeks  [  ] 36 weeks Induction  '[ ]'  Orders Entered '[ ]' Foley Y/N    2. Maternal morbid obesity, antepartum (HCC) ASA 81  3. Rubella non-immune status, antepartum MMR PP   Preterm labor symptoms and general obstetric precautions including but not limited to vaginal bleeding, contractions, leaking of fluid and fetal movement were reviewed in detail with the patient. Please refer to After Visit Summary for other counseling recommendations.   Return in about 2 weeks (around 12/17/2021) for [place] Future Appointments  Date Time Provider [place]8/06/2021 12:30 PM [facility] [place]8/06/2021 12:45 PM [facility] US4 [facility]8/15/2023  9:35 AM Leftwich-Kirby, LiKathie DikeCNM [facility] None  12/31/2021  8:35 AM BaLynnda Shields  A, MD [place] None  01/07/2022  8:55 AM Woodroe Mode, MD [facility] None  01/14/2022  8:35 AM Woodroe Mode, MD [facility] None  01/21/2022  8:35 AM Woodroe Mode, MD [facility] None  01/28/2022  8:35 AM Constant, Vickii Chafe, MD [place] None    Jamie Pal, DO

## 2021-12-04 ENCOUNTER — Ambulatory Visit: Payer: POS | Admitting: *Deleted

## 2021-12-04 ENCOUNTER — Other Ambulatory Visit: Payer: Self-pay | Admitting: *Deleted

## 2021-12-04 ENCOUNTER — Ambulatory Visit: Payer: POS | Attending: Maternal & Fetal Medicine

## 2021-12-04 ENCOUNTER — Encounter: Payer: Self-pay | Admitting: *Deleted

## 2021-12-04 VITALS — BP 123/65 | HR 88

## 2021-12-04 DIAGNOSIS — O99213 Obesity complicating pregnancy, third trimester: Secondary | ICD-10-CM | POA: Diagnosis present

## 2021-12-04 DIAGNOSIS — E669 Obesity, unspecified: Secondary | ICD-10-CM | POA: Diagnosis not present

## 2021-12-04 DIAGNOSIS — Z3689 Encounter for other specified antenatal screening: Secondary | ICD-10-CM | POA: Insufficient documentation

## 2021-12-04 DIAGNOSIS — Z6841 Body Mass Index (BMI) 40.0 and over, adult: Secondary | ICD-10-CM | POA: Insufficient documentation

## 2021-12-04 DIAGNOSIS — Z3A31 31 weeks gestation of pregnancy: Secondary | ICD-10-CM | POA: Insufficient documentation

## 2021-12-17 ENCOUNTER — Ambulatory Visit (INDEPENDENT_AMBULATORY_CARE_PROVIDER_SITE_OTHER): Payer: POS | Admitting: Advanced Practice Midwife

## 2021-12-17 VITALS — BP 121/84 | HR 87 | Wt 287.6 lb

## 2021-12-17 DIAGNOSIS — Z3A33 33 weeks gestation of pregnancy: Secondary | ICD-10-CM

## 2021-12-17 DIAGNOSIS — O99213 Obesity complicating pregnancy, third trimester: Secondary | ICD-10-CM

## 2021-12-17 DIAGNOSIS — O26813 Pregnancy related exhaustion and fatigue, third trimester: Secondary | ICD-10-CM

## 2021-12-17 DIAGNOSIS — O26893 Other specified pregnancy related conditions, third trimester: Secondary | ICD-10-CM

## 2021-12-17 DIAGNOSIS — R102 Pelvic and perineal pain: Secondary | ICD-10-CM

## 2021-12-17 DIAGNOSIS — Z3403 Encounter for supervision of normal first pregnancy, third trimester: Secondary | ICD-10-CM

## 2021-12-17 NOTE — Progress Notes (Signed)
   PRENATAL VISIT NOTE  Subjective:  Jamie [name] is a 31 y.o. [id number] at [redacted]w[redacted]d being seen today for ongoing prenatal care.  She is currently monitored for the following issues for this low-risk pregnancy and has Encounter for supervision of normal first pregnancy in third trimester; Maternal morbid obesity, antepartum (HCC); and Rubella non-immune status, antepartum on their problem list.  Patient reports  pelvic pressure and fatigue .  Contractions: Not present. Vag. Bleeding: None.  Movement: Present. Denies leaking of fluid.   The following portions of the patient's history were reviewed and updated as appropriate: allergies, current medications, past family history, past medical history, past social history, past surgical history and problem list.   Objective:   Vitals:   12/17/21 0929  BP: 121/84  Pulse: 87  Weight: 287 lb 9.6 oz (130.5 kg)    Fetal Status: Fetal Heart Rate (bpm): 153 Fundal Height: 33 cm Movement: Present     General:  Alert, oriented and cooperative. Patient is in no acute distress.  Skin: Skin is warm and dry. No rash noted.   Cardiovascular: Normal heart rate noted  Respiratory: Normal respiratory effort, no problems with respiration noted  Abdomen: Soft, gravid, appropriate for gestational age.  Pain/Pressure: Present     Pelvic: Cervical exam deferred        Extremities: Normal range of motion.  Edema: None  Mental Status: Normal mood and affect. Normal behavior. Normal judgment and thought content.   Assessment and Plan:  Pregnancy: [id number] at [redacted]w[redacted]d 1. Encounter for supervision of normal first pregnancy in third trimester --Anticipatory guidance about next visits/weeks of pregnancy given.   2. Maternal morbid obesity, antepartum (HCC) --Baby aspirin daily  3. [redacted] weeks gestation of pregnancy  4. Pelvic pressure in pregnancy, third trimester --Pressure when walking for a long time, had pain at [facility] [employer] [employer] recently, had to sit down and couldn't walk from  back of building to front door due to pain. --Pain and fatigue at work, see below.  5. Fatigue during pregnancy in third trimester --Pt is not sleeping well, has significant fatigue during this pregnancy.  Hgb was 10.8 on 7/12, pt taking PO iron --Note provided to limit pt work hours, have her come in to work later to allow for more sleep --Note provided to excuse pt from work hours missed due to symptoms last week.    Preterm labor symptoms and general obstetric precautions including but not limited to vaginal bleeding, contractions, leaking of fluid and fetal movement were reviewed in detail with the patient. Please refer to After Visit Summary for other counseling recommendations.   Return in about 2 weeks (around 12/31/2021) for LOB, Any provider.  Future Appointments  Date Time Provider Department Center  12/31/2021  8:35 AM Warden Fillers, MD [facility] None  01/01/2022 12:30 PM [facility] NURSE [facility] [place]  01/01/2022 12:45 PM [facility] US5 [facility]  01/07/2022  8:55 AM Adam Phenix, MD [facility] None  01/08/2022 12:30 PM [facility] NURSE [facility]  01/08/2022 12:45 PM [facility] US5 [facility]  01/14/2022  8:35 AM Adam Phenix, MD [facility] None  01/15/2022 12:30 PM [facility] NURSE [facility]  01/15/2022 12:45 PM [facility] US5 [facility]  01/21/2022  8:35 AM Adam Phenix, MD [facility] None  01/28/2022  8:35 AM Constant, Gigi Gin, MD [facility] None    Sharen Counter, CNM

## 2021-12-17 NOTE — Addendum Note (Signed)
Addended by: Sharen Counter A on: 12/17/2021 02:23 PM   Modules accepted: Orders

## 2021-12-17 NOTE — Progress Notes (Signed)
Pt reports fetal movement and reports more pressure when standing and walking long distances.

## 2021-12-19 LAB — CULTURE, OB URINE

## 2021-12-19 LAB — URINE CULTURE, OB REFLEX

## 2021-12-28 ENCOUNTER — Encounter (HOSPITAL_COMMUNITY): Payer: Self-pay | Admitting: Obstetrics & Gynecology

## 2021-12-28 ENCOUNTER — Inpatient Hospital Stay (HOSPITAL_COMMUNITY)
Admission: AD | Admit: 2021-12-28 | Discharge: 2021-12-28 | Disposition: A | Discharge status: Home / Self Care | Payer: POS | Attending: Obstetrics & Gynecology | Admitting: Obstetrics & Gynecology

## 2021-12-28 DIAGNOSIS — O368130 Decreased fetal movements, third trimester, not applicable or unspecified: Secondary | ICD-10-CM

## 2021-12-28 DIAGNOSIS — Z3689 Encounter for other specified antenatal screening: Secondary | ICD-10-CM | POA: Diagnosis not present

## 2021-12-28 DIAGNOSIS — Z3A34 34 weeks gestation of pregnancy: Secondary | ICD-10-CM | POA: Diagnosis not present

## 2021-12-28 NOTE — MAU Provider Note (Signed)
History     CSN: [id number]  Arrival date and time: 12/28/21 7829   Event Date/Time   First Provider Initiated Contact with Patient 12/28/21 1949      Chief Complaint  Patient presents with   Decreased Fetal Movement   HPI  [name] is a 31 y.o. female [phone] @ [redacted]w[redacted]d here in MAU with complaints of decreased fetal movement. She reports less than normal movements most of today. She tried drinking caffeine and taking a shower and nothing made the baby move normally. She has no pain or bleeding. No others concerns.   OB History     Gravida  4   Para  2   Term  2   Preterm      AB  1   Living  2      SAB  1   IAB      Ectopic      Multiple  0   Live Births  2           History reviewed. No pertinent past medical history.  Past Surgical History:  Procedure Laterality Date   NO PAST SURGERIES      Family History  Problem Relation Age of Onset   Heart murmur Mother    Hypertension Mother    Diabetes Father    Stroke Father    Heart disease Father    Heart failure Father    Arrhythmia Brother    Healthy Brother    Diabetes Maternal Grandfather    Heart disease Maternal Grandfather    Hypertension Maternal Grandfather    Heart attack Paternal Grandmother     Social History   Tobacco Use   Smoking status: Never   Smokeless tobacco: Never  Vaping Use   Vaping Use: Never used  Substance Use Topics   Alcohol use: Not Currently    Comment: socially, before pregnancy   Drug use: No    Allergies:  Allergies  Allergen Reactions   Shellfish Allergy Swelling    Throat    Prednisone Other (See Comments)    "flutters"  bradycardia     Medications Prior to Admission  Medication Sig Dispense Refill Last Dose   aspirin EC 81 MG tablet Take 1 tablet (81 mg total) by mouth daily. 60 tablet 2 12/27/2021   ferrous sulfate 325 (65 FE) MG tablet Take 325 mg by mouth daily with breakfast.   12/28/2021   Prenatal MV & Min w/FA-DHA (PRENATAL  GUMMIES PO) Take by mouth.       Review of Systems  Gastrointestinal:  Negative for abdominal pain.  Genitourinary:  Negative for vaginal bleeding and vaginal discharge.   Physical Exam   Blood pressure 115/63, pulse 91, last menstrual period 04/06/2021, SpO2 99 %.  No data found.   Physical Exam Constitutional:      Appearance: Normal appearance.  HENT:     Head: Normocephalic.  Pulmonary:     Effort: Pulmonary effort is normal.  Skin:    General: Skin is warm.  Neurological:     Mental Status: She is alert and oriented to person, place, and time.  Psychiatric:        Behavior: Behavior normal.    Fetal Tracing: Baseline: 145 bpm Variability: Moderate  Accelerations: 15x15 Decelerations: None Toco:  None  MAU Course  Procedures  MDM  Feeling normal movements while in MAU Reactive, category 1 fetal tracing.   Assessment and Plan   A:  1. Decreased fetal movements in  third trimester, single or unspecified fetus   2. NST (non-stress test) reactive      P:  Dc home Kick counts reviewed Return to MAU if symptoms worsen  Shawnda Mauney, Harolyn Rutherford, NP 12/28/2021 7:57 PM

## 2021-12-28 NOTE — MAU Note (Signed)
..  Jamie [name] is a 31 y.o. at [redacted]w[redacted]d here in MAU reporting: No fetal movement today, but does report movement in the lobby after drinking some water and having a piece of gum. Reports some pressure above her pelvic bone that is constant and gets better with walking.  Denies contractions, vaginal bleeding, or leaking of fluid.   Pain score: 7/10  FHT:153 Lab orders placed from triage:  UA

## 2021-12-31 ENCOUNTER — Ambulatory Visit (INDEPENDENT_AMBULATORY_CARE_PROVIDER_SITE_OTHER): Payer: POS | Admitting: Obstetrics and Gynecology

## 2021-12-31 VITALS — BP 117/76 | HR 90 | Wt 290.0 lb

## 2021-12-31 DIAGNOSIS — Z6841 Body Mass Index (BMI) 40.0 and over, adult: Secondary | ICD-10-CM | POA: Insufficient documentation

## 2021-12-31 DIAGNOSIS — Z2839 Other underimmunization status: Secondary | ICD-10-CM

## 2021-12-31 DIAGNOSIS — Z3A35 35 weeks gestation of pregnancy: Secondary | ICD-10-CM

## 2021-12-31 DIAGNOSIS — O99213 Obesity complicating pregnancy, third trimester: Secondary | ICD-10-CM

## 2021-12-31 DIAGNOSIS — O09893 Supervision of other high risk pregnancies, third trimester: Secondary | ICD-10-CM

## 2021-12-31 DIAGNOSIS — Z3480 Encounter for supervision of other normal pregnancy, unspecified trimester: Secondary | ICD-10-CM

## 2021-12-31 NOTE — Progress Notes (Signed)
   PRENATAL VISIT NOTE  Subjective:  Jamie [name] is a 31 y.o. K2H0623 at [redacted]w[redacted]d being seen today for ongoing prenatal care.  She is currently monitored for the following issues for this low-risk pregnancy and has Supervision of other normal pregnancy, antepartum; Maternal morbid obesity, antepartum (HCC); Rubella non-immune status, antepartum; and BMI 40.0-44.9, adult (HCC) on their problem list.  Patient doing well with no acute concerns today. She reports no complaints.  Contractions: Not present. Vag. Bleeding: None.  Movement: Present. Denies leaking of fluid.   The following portions of the patient's history were reviewed and updated as appropriate: allergies, current medications, past family history, past medical history, past social history, past surgical history and problem list. Problem list updated.  Objective:   Vitals:   12/31/21 0836  BP: 117/76  Pulse: 90  Weight: 290 lb (131.5 kg)    Fetal Status: Fetal Heart Rate (bpm): 140 Fundal Height: 35 cm Movement: Present     General:  Alert, oriented and cooperative. Patient is in no acute distress.  Skin: Skin is warm and dry. No rash noted.   Cardiovascular: Normal heart rate noted  Respiratory: Normal respiratory effort, no problems with respiration noted  Abdomen: Soft, gravid, appropriate for gestational age.  Pain/Pressure: Absent     Pelvic: Cervical exam deferred        Extremities: Normal range of motion.  Edema: None  Mental Status:  Normal mood and affect. Normal behavior. Normal judgment and thought content.   Assessment and Plan:  Pregnancy: [id number] at [redacted]w[redacted]d  1. [redacted] weeks gestation of pregnancy   2. Maternal morbid obesity, antepartum (HCC) Maternal weight relatively stable  3. Rubella non-immune status, antepartum Treat after delivery  4. BMI 40.0-44.9, adult (HCC)   5. Supervision of other normal pregnancy, antepartum Continue routine prenatal care Pt has growth scan and BPP on 01/01/22 Weekly BPP  until delivery  Preterm labor symptoms and general obstetric precautions including but not limited to vaginal bleeding, contractions, leaking of fluid and fetal movement were reviewed in detail with the patient.  Please refer to After Visit Summary for other counseling recommendations.   Return in about 1 week (around 01/07/2022) for ROB, in person, 36 weeks swabs.   Mariel Aloe, MD Faculty Attending Center for [facility] [place]

## 2022-01-01 ENCOUNTER — Ambulatory Visit: Payer: POS | Attending: Obstetrics and Gynecology

## 2022-01-01 ENCOUNTER — Ambulatory Visit: Payer: POS | Admitting: *Deleted

## 2022-01-01 VITALS — BP 114/68 | HR 104

## 2022-01-01 DIAGNOSIS — O99213 Obesity complicating pregnancy, third trimester: Secondary | ICD-10-CM | POA: Diagnosis present

## 2022-01-01 DIAGNOSIS — E669 Obesity, unspecified: Secondary | ICD-10-CM | POA: Diagnosis not present

## 2022-01-01 DIAGNOSIS — Z3A35 35 weeks gestation of pregnancy: Secondary | ICD-10-CM | POA: Diagnosis not present

## 2022-01-07 ENCOUNTER — Encounter: Payer: Self-pay | Admitting: Obstetrics & Gynecology

## 2022-01-07 ENCOUNTER — Other Ambulatory Visit (HOSPITAL_COMMUNITY)
Admission: RE | Admit: 2022-01-07 | Discharge: 2022-01-07 | Disposition: A | Discharge status: Home / Self Care | Payer: POS | Source: Ambulatory Visit | Attending: Obstetrics & Gynecology | Admitting: Obstetrics & Gynecology

## 2022-01-07 ENCOUNTER — Ambulatory Visit (INDEPENDENT_AMBULATORY_CARE_PROVIDER_SITE_OTHER): Payer: POS | Admitting: Obstetrics & Gynecology

## 2022-01-07 VITALS — BP 109/74 | HR 90 | Wt 290.1 lb

## 2022-01-07 DIAGNOSIS — Z3480 Encounter for supervision of other normal pregnancy, unspecified trimester: Secondary | ICD-10-CM

## 2022-01-07 DIAGNOSIS — O09899 Supervision of other high risk pregnancies, unspecified trimester: Secondary | ICD-10-CM

## 2022-01-07 DIAGNOSIS — Z3A36 36 weeks gestation of pregnancy: Secondary | ICD-10-CM

## 2022-01-07 DIAGNOSIS — O99213 Obesity complicating pregnancy, third trimester: Secondary | ICD-10-CM

## 2022-01-07 DIAGNOSIS — Z2839 Other underimmunization status: Secondary | ICD-10-CM

## 2022-01-07 DIAGNOSIS — Z3483 Encounter for supervision of other normal pregnancy, third trimester: Secondary | ICD-10-CM

## 2022-01-07 DIAGNOSIS — O09893 Supervision of other high risk pregnancies, third trimester: Secondary | ICD-10-CM

## 2022-01-07 NOTE — Progress Notes (Signed)
Pt presents for routine prenatal visit. No questions or concerns at this time.

## 2022-01-07 NOTE — Progress Notes (Signed)
   PRENATAL VISIT NOTE  Subjective:  Jamie [name] is a 31 y.o. [id number] at [redacted]w[redacted]d being seen today for ongoing prenatal care.  She is currently monitored for the following issues for this high-risk pregnancy and has Supervision of other normal pregnancy, antepartum; Maternal morbid obesity, antepartum (HCC); Rubella non-immune status, antepartum; and BMI 40.0-44.9, adult (HCC) on their problem list.  Patient reports no complaints.  Contractions: Not present. Vag. Bleeding: None.  Movement: Present. Denies leaking of fluid.   The following portions of the patient's history were reviewed and updated as appropriate: allergies, current medications, past family history, past medical history, past social history, past surgical history and problem list.   Objective:   Vitals:   01/07/22 0852  BP: 109/74  Pulse: 90  Weight: 290 lb 1.6 oz (131.6 kg)    Fetal Status: Fetal Heart Rate (bpm): 170   Movement: Present  Presentation: Vertex  General:  Alert, oriented and cooperative. Patient is in no acute distress.  Skin: Skin is warm and dry. No rash noted.   Cardiovascular: Normal heart rate noted  Respiratory: Normal respiratory effort, no problems with respiration noted  Abdomen: Soft, gravid, appropriate for gestational age.  Pain/Pressure: Absent     Pelvic: Cervical exam performed in the presence of a chaperone Dilation: 1 Effacement (%): 30 Station: -3  Extremities: Normal range of motion.  Edema: None  Mental Status: Normal mood and affect. Normal behavior. Normal judgment and thought content.   Assessment and Plan:  Pregnancy: [id number] at [redacted]w[redacted]d 1. Supervision of other normal pregnancy, antepartum routine  - Strep Gp B Culture+Rflx - Cervicovaginal ancillary only  2. Maternal morbid obesity, antepartum (HCC) Body mass index is 41.63 kg/m.   3. Rubella non-immune status, antepartum Vaccinate pp  Preterm labor symptoms and general obstetric precautions including but not limited to  vaginal bleeding, contractions, leaking of fluid and fetal movement were reviewed in detail with the patient. Please refer to After Visit Summary for other counseling recommendations.   No follow-ups on file.  Future Appointments  Date Time Provider Department Center  01/08/2022 12:30 PM [facility] NURSE [facility]  01/08/2022 12:45 PM [facility] US5 [facility]  01/14/2022  8:35 AM Adam Phenix, MD [facility] None  01/15/2022 12:30 PM [facility] NURSE [facility]  01/15/2022 12:45 PM [facility] US5 [facility]  01/21/2022  8:35 AM Adam Phenix, MD [facility] None  01/28/2022  8:35 AM Constant, Gigi Gin, MD [facility] None    Scheryl Darter, MD

## 2022-01-08 ENCOUNTER — Ambulatory Visit: Payer: POS | Attending: Obstetrics and Gynecology

## 2022-01-08 ENCOUNTER — Ambulatory Visit: Payer: POS | Admitting: *Deleted

## 2022-01-08 ENCOUNTER — Other Ambulatory Visit: Payer: Self-pay | Admitting: *Deleted

## 2022-01-08 VITALS — BP 114/67 | HR 91

## 2022-01-08 DIAGNOSIS — O09899 Supervision of other high risk pregnancies, unspecified trimester: Secondary | ICD-10-CM | POA: Insufficient documentation

## 2022-01-08 DIAGNOSIS — Z3A36 36 weeks gestation of pregnancy: Secondary | ICD-10-CM | POA: Diagnosis not present

## 2022-01-08 DIAGNOSIS — Z3480 Encounter for supervision of other normal pregnancy, unspecified trimester: Secondary | ICD-10-CM

## 2022-01-08 DIAGNOSIS — Z2839 Other underimmunization status: Secondary | ICD-10-CM | POA: Insufficient documentation

## 2022-01-08 DIAGNOSIS — O99213 Obesity complicating pregnancy, third trimester: Secondary | ICD-10-CM | POA: Insufficient documentation

## 2022-01-08 LAB — CERVICOVAGINAL ANCILLARY ONLY
Chlamydia: NEGATIVE
Comment: NEGATIVE
Comment: NEGATIVE
Comment: NORMAL
Neisseria Gonorrhea: NEGATIVE
Trichomonas: NEGATIVE

## 2022-01-11 LAB — STREP GP B CULTURE+RFLX: Strep Gp B Culture+Rflx: NEGATIVE

## 2022-01-14 ENCOUNTER — Encounter: Payer: Self-pay | Admitting: Obstetrics and Gynecology

## 2022-01-14 ENCOUNTER — Ambulatory Visit (INDEPENDENT_AMBULATORY_CARE_PROVIDER_SITE_OTHER): Payer: POS | Admitting: Obstetrics and Gynecology

## 2022-01-14 VITALS — BP 111/77 | HR 91 | Wt 290.0 lb

## 2022-01-14 DIAGNOSIS — Z2839 Other underimmunization status: Secondary | ICD-10-CM

## 2022-01-14 DIAGNOSIS — O09893 Supervision of other high risk pregnancies, third trimester: Secondary | ICD-10-CM

## 2022-01-14 DIAGNOSIS — O99213 Obesity complicating pregnancy, third trimester: Secondary | ICD-10-CM

## 2022-01-14 DIAGNOSIS — Z3480 Encounter for supervision of other normal pregnancy, unspecified trimester: Secondary | ICD-10-CM

## 2022-01-14 DIAGNOSIS — Z3A37 37 weeks gestation of pregnancy: Secondary | ICD-10-CM

## 2022-01-14 NOTE — Progress Notes (Signed)
   PRENATAL VISIT NOTE  Subjective:  Jamie [name] is a 31 y.o. [id number] at [redacted]w[redacted]d being seen today for ongoing prenatal care.  She is currently monitored for the following issues for this high-risk pregnancy and has Supervision of other normal pregnancy, antepartum; Maternal morbid obesity, antepartum (HCC); Rubella non-immune status, antepartum; and BMI 40.0-44.9, adult (HCC) on their problem list.  Patient reports no complaints.  Contractions: Not present. Vag. Bleeding: None.  Movement: Present. Denies leaking of fluid.   The following portions of the patient's history were reviewed and updated as appropriate: allergies, current medications, past family history, past medical history, past social history, past surgical history and problem list.   Objective:   Vitals:   01/14/22 0839  BP: 111/77  Pulse: 91  Weight: 290 lb (131.5 kg)    Fetal Status: Fetal Heart Rate (bpm): 145 Fundal Height: 37 cm Movement: Present     General:  Alert, oriented and cooperative. Patient is in no acute distress.  Skin: Skin is warm and dry. No rash noted.   Cardiovascular: Normal heart rate noted  Respiratory: Normal respiratory effort, no problems with respiration noted  Abdomen: Soft, gravid, appropriate for gestational age.  Pain/Pressure: Present     Pelvic: Cervical exam deferred        Extremities: Normal range of motion.  Edema: Trace  Mental Status: Normal mood and affect. Normal behavior. Normal judgment and thought content.   Assessment and Plan:  Pregnancy: [id number] at [redacted]w[redacted]d 1. Supervision of other normal pregnancy, antepartum Patient is doing well without complaints Considering [name] for contraception  2. Maternal morbid obesity, antepartum (HCC) Continue ASA Follow up ultrasound tomorrow- scheduled for weekly BPP  3. Rubella non-immune status, antepartum Will offer pp  Term labor symptoms and general obstetric precautions including but not limited to vaginal bleeding, contractions,  leaking of fluid and fetal movement were reviewed in detail with the patient. Please refer to After Visit Summary for other counseling recommendations.   Return in about 1 week (around 01/21/2022) for in person, ROB, High risk.  Future Appointments  Date Time Provider Department Center  01/15/2022 12:30 PM [facility] NURSE [facility]  01/15/2022 12:45 PM [facility] US5 [facility]  01/21/2022  8:35 AM Reva Bores, MD [facility] None  01/22/2022 10:15 AM [facility] NURSE [facility]  01/22/2022 10:30 AM [facility] US2 [facility]  01/28/2022  8:35 AM Tyshan Enderle, Gigi Gin, MD [facility] None  01/29/2022 10:15 AM [facility] NURSE [facility]  01/29/2022 10:30 AM [facility] US2 [facility]    Catalina Antigua, MD

## 2022-01-15 ENCOUNTER — Ambulatory Visit: Payer: POS | Admitting: *Deleted

## 2022-01-15 ENCOUNTER — Ambulatory Visit: Payer: POS | Attending: Obstetrics and Gynecology

## 2022-01-15 VITALS — BP 115/73 | HR 90

## 2022-01-15 DIAGNOSIS — O99213 Obesity complicating pregnancy, third trimester: Secondary | ICD-10-CM

## 2022-01-21 ENCOUNTER — Ambulatory Visit (INDEPENDENT_AMBULATORY_CARE_PROVIDER_SITE_OTHER): Payer: POS | Admitting: Family Medicine

## 2022-01-21 ENCOUNTER — Encounter: Payer: Self-pay | Admitting: Family Medicine

## 2022-01-21 VITALS — BP 120/73 | HR 90 | Wt 290.9 lb

## 2022-01-21 DIAGNOSIS — O09893 Supervision of other high risk pregnancies, third trimester: Secondary | ICD-10-CM

## 2022-01-21 DIAGNOSIS — Z3A38 38 weeks gestation of pregnancy: Secondary | ICD-10-CM

## 2022-01-21 DIAGNOSIS — Z2839 Other underimmunization status: Secondary | ICD-10-CM

## 2022-01-21 DIAGNOSIS — Z6841 Body Mass Index (BMI) 40.0 and over, adult: Secondary | ICD-10-CM

## 2022-01-21 DIAGNOSIS — Z3483 Encounter for supervision of other normal pregnancy, third trimester: Secondary | ICD-10-CM

## 2022-01-21 DIAGNOSIS — Z3480 Encounter for supervision of other normal pregnancy, unspecified trimester: Secondary | ICD-10-CM

## 2022-01-21 NOTE — Progress Notes (Signed)
   PRENATAL VISIT NOTE  Subjective:  Jamie [name] is a 31 y.o. [id number] at 60w1dbeing seen today for ongoing prenatal care.  She is currently monitored for the following issues for this low-risk pregnancy and has Supervision of other normal pregnancy, antepartum; Maternal morbid obesity, antepartum ([place]; Rubella non-immune status, antepartum; and BMI 40.0-44.9, adult ([place] on their problem list.  Patient reports no complaints.  Contractions: Irritability. Vag. Bleeding: None.  Movement: Present. Denies leaking of fluid.   The following portions of the patient's history were reviewed and updated as appropriate: allergies, current medications, past family history, past medical history, past social history, past surgical history and problem list.   Objective:   Vitals:   01/21/22 0910  BP: 120/73  Pulse: 90  Weight: 290 lb 14.4 oz (132 kg)    Fetal Status: Fetal Heart Rate (bpm): 154 Fundal Height: 36 cm Movement: Present  Presentation: Vertex  General:  Alert, oriented and cooperative. Patient is in no acute distress.  Skin: Skin is warm and dry. No rash noted.   Cardiovascular: Normal heart rate noted  Respiratory: Normal respiratory effort, no problems with respiration noted  Abdomen: Soft, gravid, appropriate for gestational age.  Pain/Pressure: Present     Pelvic: Cervical exam deferred        Extremities: Normal range of motion.  Edema: Trace  Mental Status: Normal mood and affect. Normal behavior. Normal judgment and thought content.   Assessment and Plan:  Pregnancy: [id number] 331w1d. Supervision of other normal pregnancy, antepartum - CBC; Standing - Type and screen; Standing - RPR; Standing  2. Rubella non-immune status, antepartum Needs MMR pp  3. BMI 40.0-44.9, adult ([place] antenatal testing with MFM IOL at 40 weeks - orders placed  Term labor symptoms and general obstetric precautions including but not limited to vaginal bleeding, contractions, leaking of  fluid and fetal movement were reviewed in detail with the patient. Please refer to After Visit Summary for other counseling recommendations.   Return in 1 week (on 01/28/2022).  Future Appointments  Date Time Provider [place]9/20/2023 10:15 AM [facility]/20/2023 10:30 AM [facility] US2 [facility]9/26/2023  8:35 AM Constant, PeVickii ChafeMD [facility] None  01/29/2022 10:15 AM [facility] N[facility]SE [facility]/27/2023 10:30 AM [facility] US2 [facility]    TaDonnamae JudeMD

## 2022-01-21 NOTE — Progress Notes (Signed)
Pt presents for ROB visit. No concerns at this time.  

## 2022-01-22 ENCOUNTER — Ambulatory Visit: Payer: POS | Admitting: *Deleted

## 2022-01-22 ENCOUNTER — Ambulatory Visit: Payer: POS | Attending: Obstetrics

## 2022-01-22 VITALS — BP 117/65 | HR 78

## 2022-01-22 DIAGNOSIS — O99213 Obesity complicating pregnancy, third trimester: Secondary | ICD-10-CM | POA: Diagnosis not present

## 2022-01-24 ENCOUNTER — Other Ambulatory Visit: Payer: Self-pay | Admitting: Advanced Practice Midwife

## 2022-01-27 ENCOUNTER — Encounter (HOSPITAL_COMMUNITY): Payer: Self-pay

## 2022-01-27 ENCOUNTER — Telehealth (HOSPITAL_COMMUNITY): Payer: Self-pay | Admitting: *Deleted

## 2022-01-27 NOTE — Telephone Encounter (Signed)
Preadmission screen  

## 2022-01-28 ENCOUNTER — Telehealth (HOSPITAL_COMMUNITY): Payer: Self-pay | Admitting: *Deleted

## 2022-01-28 ENCOUNTER — Encounter: Payer: Self-pay | Admitting: Obstetrics and Gynecology

## 2022-01-28 ENCOUNTER — Encounter (HOSPITAL_COMMUNITY): Payer: Self-pay

## 2022-01-28 ENCOUNTER — Ambulatory Visit (INDEPENDENT_AMBULATORY_CARE_PROVIDER_SITE_OTHER): Payer: POS | Admitting: Obstetrics and Gynecology

## 2022-01-28 VITALS — BP 119/80 | HR 81 | Wt 295.0 lb

## 2022-01-28 DIAGNOSIS — Z3480 Encounter for supervision of other normal pregnancy, unspecified trimester: Secondary | ICD-10-CM

## 2022-01-28 DIAGNOSIS — Z2839 Other underimmunization status: Secondary | ICD-10-CM

## 2022-01-28 DIAGNOSIS — Z3A39 39 weeks gestation of pregnancy: Secondary | ICD-10-CM

## 2022-01-28 DIAGNOSIS — O09899 Supervision of other high risk pregnancies, unspecified trimester: Secondary | ICD-10-CM

## 2022-01-28 DIAGNOSIS — Z3483 Encounter for supervision of other normal pregnancy, third trimester: Secondary | ICD-10-CM

## 2022-01-28 DIAGNOSIS — O09893 Supervision of other high risk pregnancies, third trimester: Secondary | ICD-10-CM

## 2022-01-28 DIAGNOSIS — O99213 Obesity complicating pregnancy, third trimester: Secondary | ICD-10-CM

## 2022-01-28 NOTE — Progress Notes (Signed)
   PRENATAL VISIT NOTE  Subjective:  Jamie [name] is a 31 y.o. [id number] at [redacted]w[redacted]d being seen today for ongoing prenatal care.  She is currently monitored for the following issues for this high-risk pregnancy and has Supervision of other normal pregnancy, antepartum; Maternal morbid obesity, antepartum ([place]); Rubella non-immune status, antepartum; and BMI 40.0-44.9, adult ([place]) on their problem list.  Patient reports no complaints.  Contractions: Irregular. Vag. Bleeding: None.  Movement: Present. Denies leaking of fluid.   The following portions of the patient's history were reviewed and updated as appropriate: allergies, current medications, past family history, past medical history, past social history, past surgical history and problem list.   Objective:   Vitals:   01/28/22 0852  BP: 119/80  Pulse: 81  Weight: 295 lb (133.8 kg)    Fetal Status: Fetal Heart Rate (bpm): 140 Fundal Height: 38 cm Movement: Present  Presentation: Vertex  General:  Alert, oriented and cooperative. Patient is in no acute distress.  Skin: Skin is warm and dry. No rash noted.   Cardiovascular: Normal heart rate noted  Respiratory: Normal respiratory effort, no problems with respiration noted  Abdomen: Soft, gravid, appropriate for gestational age.  Pain/Pressure: Present     Pelvic: Cervical exam performed in the presence of a chaperone Dilation: 3 Effacement (%): 50 Station: -3  Extremities: Normal range of motion.     Mental Status: Normal mood and affect. Normal behavior. Normal judgment and thought content.   Assessment and Plan:  Pregnancy: [id number] at [redacted]w[redacted]d 1. Supervision of other normal pregnancy, antepartum Patient is doing well without complaints  2. Maternal morbid obesity, antepartum ([place]) Follow up BPP tomorrow Scheduled for IOL on 10/1  3. Rubella non-immune status, antepartum Will offer pp  Term labor symptoms and general obstetric precautions including but not limited to vaginal  bleeding, contractions, leaking of fluid and fetal movement were reviewed in detail with the patient. Please refer to After Visit Summary for other counseling recommendations.   Return in about 6 weeks (around 03/11/2022) for postpartum.  Future Appointments  Date Time Provider [place]  01/29/2022 10:15 AM [facility] NURSE [facility]  01/29/2022 10:30 AM [facility] US2 [facility]  02/02/2022 12:00 AM [facility] SCHED ROOM [facility] None    Mora Bellman, MD

## 2022-01-28 NOTE — Telephone Encounter (Signed)
Preadmission screen  

## 2022-01-29 ENCOUNTER — Other Ambulatory Visit: Payer: Self-pay

## 2022-01-29 ENCOUNTER — Ambulatory Visit (HOSPITAL_BASED_OUTPATIENT_CLINIC_OR_DEPARTMENT_OTHER): Payer: POS

## 2022-01-29 ENCOUNTER — Other Ambulatory Visit: Payer: Self-pay | Admitting: Obstetrics

## 2022-01-29 ENCOUNTER — Inpatient Hospital Stay (HOSPITAL_COMMUNITY)
Admission: AD | Admit: 2022-01-29 | Discharge: 2022-01-31 | DRG: 807 | Disposition: A | Discharge status: Home / Self Care | Payer: POS | Attending: Family Medicine | Admitting: Family Medicine

## 2022-01-29 ENCOUNTER — Ambulatory Visit: Payer: POS | Admitting: *Deleted

## 2022-01-29 ENCOUNTER — Encounter (HOSPITAL_COMMUNITY): Payer: Self-pay | Admitting: *Deleted

## 2022-01-29 ENCOUNTER — Encounter (HOSPITAL_COMMUNITY): Payer: Self-pay | Admitting: Obstetrics and Gynecology

## 2022-01-29 ENCOUNTER — Telehealth (HOSPITAL_COMMUNITY): Payer: Self-pay | Admitting: *Deleted

## 2022-01-29 VITALS — BP 122/62 | HR 72

## 2022-01-29 DIAGNOSIS — O26893 Other specified pregnancy related conditions, third trimester: Secondary | ICD-10-CM | POA: Diagnosis present

## 2022-01-29 DIAGNOSIS — Z3A39 39 weeks gestation of pregnancy: Secondary | ICD-10-CM

## 2022-01-29 DIAGNOSIS — Z3480 Encounter for supervision of other normal pregnancy, unspecified trimester: Secondary | ICD-10-CM

## 2022-01-29 DIAGNOSIS — O480 Post-term pregnancy: Principal | ICD-10-CM | POA: Diagnosis present

## 2022-01-29 DIAGNOSIS — Z23 Encounter for immunization: Secondary | ICD-10-CM

## 2022-01-29 DIAGNOSIS — O99214 Obesity complicating childbirth: Secondary | ICD-10-CM | POA: Diagnosis present

## 2022-01-29 DIAGNOSIS — E669 Obesity, unspecified: Secondary | ICD-10-CM

## 2022-01-29 DIAGNOSIS — O99213 Obesity complicating pregnancy, third trimester: Secondary | ICD-10-CM

## 2022-01-29 DIAGNOSIS — Z6841 Body Mass Index (BMI) 40.0 and over, adult: Secondary | ICD-10-CM

## 2022-01-29 DIAGNOSIS — O09899 Supervision of other high risk pregnancies, unspecified trimester: Secondary | ICD-10-CM

## 2022-01-29 DIAGNOSIS — Z7982 Long term (current) use of aspirin: Secondary | ICD-10-CM

## 2022-01-29 DIAGNOSIS — Z2839 Other underimmunization status: Secondary | ICD-10-CM

## 2022-01-29 LAB — CBC
HCT: 35.6 % — ABNORMAL LOW (ref 36.0–46.0)
Hemoglobin: 12 g/dL (ref 12.0–15.0)
MCH: 30.6 pg (ref 26.0–34.0)
MCHC: 33.7 g/dL (ref 30.0–36.0)
MCV: 90.8 fL (ref 80.0–100.0)
Platelets: 278 10*3/uL (ref 150–400)
RBC: 3.92 MIL/uL (ref 3.87–5.11)
RDW: 14 % (ref 11.5–15.5)
WBC: 8.5 10*3/uL (ref 4.0–10.5)
nRBC: 0 % (ref 0.0–0.2)

## 2022-01-29 LAB — TYPE AND SCREEN
ABO/RH(D): O POS
Antibody Screen: NEGATIVE

## 2022-01-29 LAB — POCT FERN TEST: POCT Fern Test: POSITIVE

## 2022-01-29 MED ORDER — ONDANSETRON HCL 4 MG/2ML IJ SOLN
4.0000 mg | INTRAMUSCULAR | Status: DC | PRN
  Administered 2022-01-30: 4 mg via INTRAVENOUS
  Filled 2022-01-29: qty 2

## 2022-01-29 MED ORDER — OXYTOCIN-SODIUM CHLORIDE 30-0.9 UT/500ML-% IV SOLN
2.5000 [IU]/h | INTRAVENOUS | Status: DC
  Filled 2022-01-29: qty 500

## 2022-01-29 MED ORDER — FLEET ENEMA 7-19 GM/118ML RE ENEM: 1.0000 | ENEMA | RECTAL | Status: DC | PRN

## 2022-01-29 MED ORDER — MISOPROSTOL 25 MCG QUARTER TABLET: 25.0000 ug | ORAL_TABLET | Freq: Once | ORAL | Status: DC

## 2022-01-29 MED ORDER — COCONUT OIL OIL: 1.0000 | TOPICAL_OIL | Status: DC | PRN

## 2022-01-29 MED ORDER — WITCH HAZEL-GLYCERIN EX PADS
1.0000 | MEDICATED_PAD | CUTANEOUS | Status: DC | PRN
  Administered 2022-01-30: 1 via TOPICAL

## 2022-01-29 MED ORDER — LIDOCAINE HCL (PF) 1 % IJ SOLN: 30.0000 mL | INTRAMUSCULAR | Status: DC | PRN

## 2022-01-29 MED ORDER — TERBUTALINE SULFATE 1 MG/ML IJ SOLN: 0.2500 mg | Freq: Once | INTRAMUSCULAR | Status: DC | PRN

## 2022-01-29 MED ORDER — BENZOCAINE-MENTHOL 20-0.5 % EX AERO
1.0000 | INHALATION_SPRAY | CUTANEOUS | Status: DC | PRN
  Administered 2022-01-30: 1 via TOPICAL
  Filled 2022-01-29: qty 56

## 2022-01-29 MED ORDER — OXYCODONE-ACETAMINOPHEN 5-325 MG PO TABS: 2.0000 | ORAL_TABLET | ORAL | Status: DC | PRN

## 2022-01-29 MED ORDER — DIBUCAINE (PERIANAL) 1 % EX OINT: 1.0000 | TOPICAL_OINTMENT | CUTANEOUS | Status: DC | PRN

## 2022-01-29 MED ORDER — LACTATED RINGERS IV SOLN: INTRAVENOUS | Status: DC

## 2022-01-29 MED ORDER — SENNOSIDES-DOCUSATE SODIUM 8.6-50 MG PO TABS
2.0000 | ORAL_TABLET | Freq: Every day | ORAL | Status: DC
  Administered 2022-01-30: 2 via ORAL
  Filled 2022-01-29 (×2): qty 2

## 2022-01-29 MED ORDER — SOD CITRATE-CITRIC ACID 500-334 MG/5ML PO SOLN: 30.0000 mL | ORAL | Status: DC | PRN

## 2022-01-29 MED ORDER — OXYTOCIN BOLUS FROM INFUSION
333.0000 mL | Freq: Once | INTRAVENOUS | Status: AC
  Administered 2022-01-29: 333 mL via INTRAVENOUS

## 2022-01-29 MED ORDER — DIPHENHYDRAMINE HCL 25 MG PO CAPS: 25.0000 mg | ORAL_CAPSULE | Freq: Four times a day (QID) | ORAL | Status: DC | PRN

## 2022-01-29 MED ORDER — ZOLPIDEM TARTRATE 5 MG PO TABS: 5.0000 mg | ORAL_TABLET | Freq: Every evening | ORAL | Status: DC | PRN

## 2022-01-29 MED ORDER — ONDANSETRON HCL 4 MG PO TABS: 4.0000 mg | ORAL_TABLET | ORAL | Status: DC | PRN

## 2022-01-29 MED ORDER — TETANUS-DIPHTH-ACELL PERTUSSIS 5-2.5-18.5 LF-MCG/0.5 IM SUSY
0.5000 mL | PREFILLED_SYRINGE | Freq: Once | INTRAMUSCULAR | Status: AC
  Administered 2022-01-31: 0.5 mL via INTRAMUSCULAR
  Filled 2022-01-29 (×2): qty 0.5

## 2022-01-29 MED ORDER — OXYCODONE-ACETAMINOPHEN 5-325 MG PO TABS: 1.0000 | ORAL_TABLET | ORAL | Status: DC | PRN

## 2022-01-29 MED ORDER — ONDANSETRON HCL 4 MG/2ML IJ SOLN: 4.0000 mg | Freq: Four times a day (QID) | INTRAMUSCULAR | Status: DC | PRN

## 2022-01-29 MED ORDER — LACTATED RINGERS IV SOLN: 500.0000 mL | INTRAVENOUS | Status: DC | PRN

## 2022-01-29 MED ORDER — MEASLES, MUMPS & RUBELLA VAC IJ SOLR
0.5000 mL | Freq: Once | INTRAMUSCULAR | Status: DC
  Filled 2022-01-29: qty 0.5

## 2022-01-29 MED ORDER — ACETAMINOPHEN 325 MG PO TABS: 650.0000 mg | ORAL_TABLET | ORAL | Status: DC | PRN

## 2022-01-29 MED ORDER — PRENATAL MULTIVITAMIN CH
1.0000 | ORAL_TABLET | Freq: Every day | ORAL | Status: DC
  Administered 2022-01-30: 1 via ORAL
  Filled 2022-01-29 (×2): qty 1

## 2022-01-29 MED ORDER — IBUPROFEN 600 MG PO TABS
600.0000 mg | ORAL_TABLET | Freq: Four times a day (QID) | ORAL | Status: DC
  Administered 2022-01-29 – 2022-01-31 (×6): 600 mg via ORAL
  Filled 2022-01-29 (×7): qty 1

## 2022-01-29 MED ORDER — SIMETHICONE 80 MG PO CHEW: 80.0000 mg | CHEWABLE_TABLET | ORAL | Status: DC | PRN

## 2022-01-29 MED ORDER — MISOPROSTOL 50MCG HALF TABLET: 50.0000 ug | ORAL_TABLET | Freq: Once | ORAL | Status: DC

## 2022-01-29 MED ORDER — ACETAMINOPHEN 325 MG PO TABS
650.0000 mg | ORAL_TABLET | ORAL | Status: DC | PRN
  Administered 2022-01-30 (×2): 650 mg via ORAL
  Filled 2022-01-29 (×2): qty 2

## 2022-01-29 NOTE — Lactation Note (Signed)
This note was copied from a baby's chart. Lactation Consultation Note  Patient Name: Jamie [name] TXMIW'O Date: 01/29/2022 Reason for consult: L&D Initial assessment;Term Age:32 hours Experienced BF mom all ready had mom latched when [place] came to see mom. Mom denies painful latch. Doesn't have any questions at this time. Reviewed newborn feeding positions and behavior verses bigger baby behavior. Praised mom for latching well. Encouraged mom to call for assistance as needed. Mom felt like PRN was OK and she will call as needed.  Maternal Data Has patient been taught Hand Expression?: No Does the patient have breastfeeding experience prior to this delivery?: Yes How long did the patient breastfeed?: 1st child now 86 yrs old for 3 months, 2nd child now 33 yrs old for 6 months  Feeding    LATCH Score Latch: Grasps breast easily, tongue down, lips flanged, rhythmical sucking.  Audible Swallowing: A few with stimulation  Type of Nipple: Everted at rest and after stimulation  Comfort (Breast/Nipple): Soft / non-tender  Hold (Positioning): No assistance needed to correctly position infant at breast.  LATCH Score: 9   Lactation Tools Discussed/Used    Interventions Interventions: Skin to skin  Discharge    Consult Status Consult Status: PRN Date: 01/30/22 Follow-up type: In-patient    Theodoro Kalata 01/29/2022, 7:45 PM

## 2022-01-29 NOTE — Progress Notes (Signed)
Pt informed that the ultrasound is considered a limited OB ultrasound and is not intended to be a complete ultrasound exam.  Patient also informed that the ultrasound is not being completed with the intent of assessing for fetal or placental anomalies or any pelvic abnormalities.  Explained that the purpose of today's ultrasound is to assess for  presentation.  Patient acknowledges the purpose of the exam and the limitations of the study.    Vertex confirmed.   Wende Mott, CNM 01/29/22 5:42 PM

## 2022-01-29 NOTE — Discharge Summary (Addendum)
Postpartum Discharge Summary  Date of Service updated 01/31/22     Patient Name: Jamie [name] DOB: Oct 10, 1990 MRN: [id number]  Date of admission: 01/29/2022 Delivery date:01/29/2022  Delivering provider: Ilean China R  Date of discharge: 01/31/2022  Admitting diagnosis: Post term pregnancy over 40 weeks [O48.0] Intrauterine pregnancy: [redacted]w[redacted]d    Secondary diagnosis:  Principal Problem:   Post term pregnancy over 40 weeks Active Problems:   SVD (spontaneous vaginal delivery)   Supervision of other normal pregnancy, antepartum   Maternal morbid obesity, antepartum ([place]   Rubella non-immune status, antepartum   BMI 40.0-44.9, adult ([place]  Additional problems: None    Discharge diagnosis: Term Pregnancy Delivered                                              Post partum procedures: None Augmentation: N/A Complications: None  Hospital course: Onset of Labor With Vaginal Delivery      31y.o. yo [id number] 317w2das admitted in Active Labor on 01/29/2022. Patient had an uncomplicated labor course as follows:  Membrane Rupture Time/Date: 10:00 PM ,01/28/2022   Delivery Method:Vaginal, Spontaneous  Episiotomy: None  Lacerations:  1st degree  Patient had an uncomplicated postpartum course.  She is ambulating, tolerating a regular diet, passing flatus, and urinating well. Patient is discharged home in stable condition on 01/31/22.  Newborn Data: Birth date:01/29/2022  Birth time:6:42 PM  Gender:Female  Living status:Living  Apgars:9 ,9  Weight:3300 g   Magnesium Sulfate received: No BMZ received: No Rhophylac:N/A MMR:Yes T-DaP: declined Flu: N/A Transfusion:No  Physical exam  Vitals:   01/30/22 0504 01/30/22 1028 01/30/22 1945 01/31/22 0451  BP: 108/65 105/63 107/63 106/61  Pulse: 81 77 78 71  Resp: '16 17 16 18  ' Temp: 98.3 F (36.8 C) 97.8 F (36.6 C) 98 F (36.7 C) 98.4 F (36.9 C)  TempSrc: Oral Axillary  Oral  SpO2: 95% 100%  99%  Weight:       Height:       General: alert Lochia: appropriate Uterine Fundus: firm Incision: N/A DVT Evaluation: No evidence of DVT seen on physical exam. Labs: Lab Results  Component Value Date   WBC 8.5 01/29/2022   HGB 12.0 01/29/2022   HCT 35.6 (L) 01/29/2022   MCV 90.8 01/29/2022   PLT 278 01/29/2022      Latest Ref Rng & Units 07/19/2021    9:26 AM  CMP  Glucose 70 - 99 mg/dL 95   BUN 6 - 20 mg/dL 6   Creatinine 0.57 - 1.00 mg/dL 0.71   Sodium 134 - 144 mmol/L 138   Potassium 3.5 - 5.2 mmol/L 4.0   Chloride 96 - 106 mmol/L 102   CO2 20 - 29 mmol/L 24   Calcium 8.7 - 10.2 mg/dL 9.8   Total Protein 6.0 - 8.5 g/dL 7.3   Total Bilirubin 0.0 - 1.2 mg/dL 0.4   Alkaline Phos 44 - 121 IU/L 59   AST 0 - 40 IU/L 16   ALT 0 - 32 IU/L 10    Edinburgh Score:    01/09/2017    8:51 AM  Edinburgh Postnatal Depression Scale Screening Tool  I have been able to laugh and see the funny side of things. 0  I have looked forward with enjoyment to things. 0  I have blamed myself unnecessarily when things  went wrong. 0  I have been anxious or worried for no good reason. 0  I have felt scared or panicky for no good reason. 0  Things have been getting on top of me. 0  I have been so unhappy that I have had difficulty sleeping. 0  I have felt sad or miserable. 0  I have been so unhappy that I have been crying. 0  The thought of harming myself has occurred to me. 0  Edinburgh Postnatal Depression Scale Total 0     After visit meds:  Allergies as of 01/31/2022       Reactions   Shellfish Allergy Anaphylaxis, Swelling   Throat swelling   Lactose Diarrhea   Prednisone Other (See Comments)   "flutters"  bradycardia        Medication List     TAKE these medications    acetaminophen 325 MG tablet Commonly known as: Tylenol Take 2 tablets (650 mg total) by mouth every 4 (four) hours as needed for up to 10 days (for pain scale < 4).   aspirin EC 81 MG tablet Take 1 tablet (81 mg total)  by mouth daily.   ferrous sulfate 325 (65 FE) MG tablet Take 325 mg by mouth daily with breakfast.   ibuprofen 600 MG tablet Commonly known as: ADVIL Take 1 tablet (600 mg total) by mouth every 6 (six) hours for 10 days.   measles, mumps & rubella vaccine injection Commonly known as: MMR Inject 0.5 mLs into the skin once for 1 dose.   PRENATAL GUMMIES PO Take by mouth.         Discharge home in stable condition Infant Feeding: Breast Infant Disposition:home with mother Discharge instruction: per After Visit Summary and Postpartum booklet. Activity: Advance as tolerated. Pelvic rest for 6 weeks.  Diet: routine diet Future Appointments: Future Appointments  Date Time Provider [place]  03/31/2022 10:55 AM Leftwich-Kirby, Kathie Dike, CNM [place] None   Follow up Visit:  Message sent on 9/27  Please schedule this patient for a In person postpartum visit in 6 weeks with the following provider: Any provider. Additional Postpartum F/U: n/a   Low risk pregnancy complicated by:  obesity Delivery mode:  Vaginal, Spontaneous  Anticipated Birth Control:  [name]   01/31/2022 Deloria Lair, DO  Fellow Attestation  I saw and evaluated the patient, performing the key elements of the service.I  personally performed or re-performed the history, physical exam, and medical decision making activities of this service and have verified that the service and findings are accurately documented in the resident's note. I developed the management plan that is described in the resident's note, and I agree with the content, with my edits above.    Gifford Shave, MD OB Fellow

## 2022-01-29 NOTE — H&P (Signed)
OBSTETRIC ADMISSION HISTORY AND PHYSICAL  Jamie [name] is a 31 y.o. female [phone] with IUP at [redacted]w[redacted]d presenting for loss of fluids since yesterday, 9/26, at 10:00 pm. She presented for a membrane sweep with her OB yesterday at 8:35 am. Later at 10 pm, she had a gush of fluid and contractions once every hour. She noted the fluid appeared cloudy and had a sweet odor. Today at 3:30 pm, she had another gush of fluid, prompting her to present to the MAU. On presentation her contractions were 4-5 minutes apart, and now they are even more frequent. Denies fever, abdominal pain, vaginal discharge. She reports +FMs. No LOF, VB, blurry vision, headaches, peripheral edema, or RUQ pain. She plans on breast feeding. She is unsure about birth control, considering [name].  Dating: By ultrasound --->  Estimated Date of Delivery: 02/03/22  Sono:    @[redacted]w[redacted]d , normal anatomy, cephalic presentation, 295 g, 62 %ile, EFW 0 lbs 10 oz   Prenatal History/Complications: - Morbid obesity  - Rubella non-immune   Past Medical History: History reviewed. No pertinent past medical history.  Past Surgical History: Past Surgical History:  Procedure Laterality Date   NO PAST SURGERIES      Obstetrical History: OB History     Gravida  4   Para  2   Term  2   Preterm      AB  1   Living  2      SAB  1   IAB      Ectopic      Multiple  0   Live Births  2           Social History: Social History   Socioeconomic History   Marital status: Single    Spouse name: Not on file   Number of children: Not on file   Years of education: Not on file   Highest education level: Not on file  Occupational History   Not on file  Tobacco Use   Smoking status: Never   Smokeless tobacco: Never  Vaping Use   Vaping Use: Never used  Substance and Sexual Activity   Alcohol use: Not Currently    Comment: socially, before pregnancy   Drug use: No   Sexual activity: Yes    Partners: Male    Birth  control/protection: None  Other Topics Concern   Not on file  Social History Narrative   Not on file   Social Determinants of Health   Financial Resource Strain: Not on file  Food Insecurity: Not on file  Transportation Needs: Not on file  Physical Activity: Not on file  Stress: Not on file  Social Connections: Not on file    Family History: Family History  Problem Relation Age of Onset   Heart murmur Mother    Hypertension Mother    Diabetes Father    Stroke Father    Heart disease Father    Heart failure Father    Arrhythmia Brother    Healthy Brother    Diabetes Maternal Grandfather    Heart disease Maternal Grandfather    Hypertension Maternal Grandfather    Heart attack Paternal Grandmother     Allergies: Allergies  Allergen Reactions   Shellfish Allergy Swelling    Throat    Lactose    Prednisone Other (See Comments)    "flutters"  bradycardia     Medications Prior to Admission  Medication Sig Dispense Refill Last Dose   aspirin EC 81 MG tablet Take  1 tablet (81 mg total) by mouth daily. 60 tablet 2 01/29/2022   ferrous sulfate 325 (65 FE) MG tablet Take 325 mg by mouth daily with breakfast.   01/28/2022   Prenatal MV & Min w/FA-DHA (PRENATAL GUMMIES PO) Take by mouth.   01/29/2022     Review of Systems:  All systems reviewed and negative except as stated in HPI  PE: Blood pressure 124/61, pulse 87, temperature 98 F (36.7 C), temperature source Oral, resp. rate 16, height 5\' 10"  (1.778 m), weight 133.9 kg, last menstrual period 04/06/2021, SpO2 97 %. General appearance: alert and cooperative Lungs: regular rate and effort Heart: regular rate  Abdomen: soft, non-tender Extremities: Homans sign is negative, no sign of DVT Presentation: cephalic EFM: 145 bpm, moderate variability,15 x 15 accels, no decels  Toco: contraction frequency - q64m  Dilation: 6 Effacement (%): 90 Station: -1 Exam by:: Holly Flippin RN SVE: N/A  Prenatal labs: ABO,  Rh: O/Positive/-- (03/17 0926) Antibody: Negative (03/17 0926) Rubella: <0.90 (03/17 0926) RPR: Non Reactive (07/12 0904)  HBsAg: Negative (03/17 0926)  HIV: Non Reactive (07/12 0904)  GBS: Negative/-- (09/05 0930)  2 hr GTT 100  Prenatal Transfer Tool  Maternal Diabetes: No Genetic Screening: Normal Maternal Ultrasounds/Referrals: Normal Fetal Ultrasounds or other Referrals:  Referred to Materal Fetal Medicine  Maternal Substance Abuse:  No Significant Maternal Medications:  None Significant Maternal Lab Results: Group B Strep negative  Results for orders placed or performed during the hospital encounter of 01/29/22 (from the past 24 hour(s))  POCT fern test   Collection Time: 01/29/22  5:22 PM  Result Value Ref Range   POCT Fern Test Positive = ruptured amniotic membanes     Patient Active Problem List   Diagnosis Date Noted   BMI 40.0-44.9, adult (HCC) 12/31/2021   Rubella non-immune status, antepartum 07/20/2021   Maternal morbid obesity, antepartum (HCC) 07/19/2021   Supervision of other normal pregnancy, antepartum 06/24/2021    Assessment: Jamie [name] is a 31 y.o. 34 at [redacted]w[redacted]d here for progressive loss of fluids and contractions since 10 pm yesterday, 9/26.  1. Labor: Expectant management for vaginal delivery.  2. FWB: Cat I  3. Pain: Epidural per request 4. GBS: negative   Plan: Admit to L&D.  10/26, Medical Student  01/29/2022, 6:05 PM

## 2022-01-29 NOTE — MAU Note (Signed)
Jamie [name] is a 31 y.o. at [redacted]w[redacted]d here in MAU reporting: LOF since last night, states when she uses the bathroom she still feels some fluid leaking out when shes done and feels gushes when shes laying down. Fluid is cloudy. No bleeding. Occasional contractions. +FM  Onset of complaint: last night  Pain score: 7/10  Vitals:   01/29/22 1702  BP: 129/70  Pulse: 79  Resp: 16  Temp: 98 F (36.7 C)  SpO2: 97%     FHT:151  Lab orders placed from triage: none

## 2022-01-29 NOTE — Telephone Encounter (Signed)
Preadmission screen  

## 2022-01-30 ENCOUNTER — Encounter (HOSPITAL_COMMUNITY): Payer: Self-pay | Admitting: Family Medicine

## 2022-01-30 LAB — RPR: RPR Ser Ql: NONREACTIVE

## 2022-01-30 NOTE — Progress Notes (Signed)
Post Partum Day 1 Subjective: no complaints, up ad lib, voiding, tolerating PO, and + flatus Patient reports generalized abdominal discomfort  Objective: Blood pressure 105/63, pulse 77, temperature 97.8 F (36.6 C), temperature source Axillary, resp. rate 17, height 5\' 10"  (1.778 m), weight 133.9 kg, last menstrual period 04/06/2021, SpO2 100 %, unknown if currently breastfeeding.  Physical Exam:  General: alert, cooperative, appears stated age, and no distress Lochia: appropriate Uterine Fundus: firm Incision: N/A DVT Evaluation: No evidence of DVT seen on physical exam.  Recent Labs    01/29/22 1805  HGB 12.0  HCT 35.6*    Assessment/Plan: Plan for discharge tomorrow Advised ambulation , Simethicone PRN for gas pain   LOS: 1 day   Darlina Rumpf, CNM 01/30/2022, 12:14 PM

## 2022-01-31 MED ORDER — MEASLES, MUMPS & RUBELLA VAC IJ SOLR: 0.5000 mL | Freq: Once | INTRAMUSCULAR | 0 refills | Status: AC

## 2022-01-31 MED ORDER — ACETAMINOPHEN 325 MG PO TABS: 650.0000 mg | ORAL_TABLET | ORAL | 0 refills | Status: AC | PRN

## 2022-01-31 MED ORDER — IBUPROFEN 600 MG PO TABS: 600.0000 mg | ORAL_TABLET | Freq: Four times a day (QID) | ORAL | 0 refills | Status: AC

## 2022-01-31 NOTE — Lactation Note (Signed)
This note was copied from a baby's chart. Lactation Consultation Note  Patient Name: Girl Elfrida Pixley ASTMH'D Date: 01/31/2022 Reason for consult: Follow-up assessment;Primapara;Term Age:32 hours  P3: Term infant at 39+2 weeks Feeding preference: Breast Weight loss: 5%  Birth parent requested lactation assistance.  Birth parent had baby latched when I arrived; reviewed positioning and body alignment.  Baby was clothed with a blanket cover.  Suggested STS until baby is latching and feeding well.  Suggested birth parent remove baby from the breast and I would observe a latch; parent willing.  Provided a few helpful techniques and birth parent able to latch and continue feeding.  Family has our op phone number for any further questions/concerns after discharge.  No support person present at this time.   Maternal Data    Feeding Mother's Current Feeding Choice: Breast Milk  LATCH Score Latch: Grasps breast easily, tongue down, lips flanged, rhythmical sucking.  Audible Swallowing: A few with stimulation  Type of Nipple: Everted at rest and after stimulation  Comfort (Breast/Nipple): Soft / non-tender  Hold (Positioning): Assistance needed to correctly position infant at breast and maintain latch.  LATCH Score: 8   Lactation Tools Discussed/Used    Interventions    Discharge Discharge Education: Engorgement and breast care  Consult Status Consult Status: Complete Date: 01/31/22 Follow-up type: Call as needed    Yavuz Kirby R Laquita Harlan 01/31/2022, 7:47 AM

## 2022-02-02 ENCOUNTER — Inpatient Hospital Stay (HOSPITAL_COMMUNITY): Admission: AD | Admit: 2022-02-02 | Payer: POS | Source: Home / Self Care | Admitting: Obstetrics and Gynecology

## 2022-02-02 ENCOUNTER — Inpatient Hospital Stay (HOSPITAL_COMMUNITY): Payer: POS

## 2022-02-04 ENCOUNTER — Encounter: Payer: Self-pay | Admitting: Obstetrics and Gynecology

## 2022-02-05 ENCOUNTER — Telehealth (HOSPITAL_COMMUNITY): Payer: Self-pay | Admitting: *Deleted

## 2022-02-05 NOTE — Telephone Encounter (Signed)
Attempted hospital discharge follow-up call. Left message for patient to return RN call with any questions or concerns. Erline Levine, RN, 02/05/22, [phone]

## 2022-03-31 ENCOUNTER — Ambulatory Visit (INDEPENDENT_AMBULATORY_CARE_PROVIDER_SITE_OTHER): Payer: POS | Admitting: Advanced Practice Midwife

## 2022-03-31 ENCOUNTER — Encounter: Payer: Self-pay | Admitting: Advanced Practice Midwife

## 2022-03-31 VITALS — BP 119/76 | HR 72 | Ht 70.0 in | Wt 275.6 lb

## 2022-03-31 DIAGNOSIS — R52 Pain, unspecified: Secondary | ICD-10-CM

## 2022-03-31 DIAGNOSIS — O9089 Other complications of the puerperium, not elsewhere classified: Secondary | ICD-10-CM

## 2022-03-31 DIAGNOSIS — G8929 Other chronic pain: Secondary | ICD-10-CM

## 2022-03-31 DIAGNOSIS — R5383 Other fatigue: Secondary | ICD-10-CM | POA: Diagnosis not present

## 2022-03-31 DIAGNOSIS — R42 Dizziness and giddiness: Secondary | ICD-10-CM | POA: Diagnosis not present

## 2022-03-31 DIAGNOSIS — M545 Low back pain, unspecified: Secondary | ICD-10-CM

## 2022-03-31 DIAGNOSIS — R102 Pelvic and perineal pain: Secondary | ICD-10-CM

## 2022-03-31 DIAGNOSIS — K5909 Other constipation: Secondary | ICD-10-CM

## 2022-03-31 NOTE — Progress Notes (Signed)
[facility] Partum Visit Note  Jamie [name] is a 31 y.o. [phone] female who presents for a postpartum visit. She is 8 weeks postpartum following a normal spontaneous vaginal delivery.  I have fully reviewed the prenatal and intrapartum course. The delivery was at 68 gestational weeks.  Anesthesia: none. Postpartum course has been good. Baby is doing well. Baby is feeding by breast. Bleeding no bleeding. Bowel function is  slow. Pt c/o constipation . Bladder function is normal. Patient is not sexually active. Contraception method is abstinence. Postpartum depression screening: negative.   Upstream - 03/31/22 1048       Pregnancy Intention Screening   Does the patient want to become pregnant in the next year? No    Does the patient's partner want to become pregnant in the next year? N/A    Would the patient like to discuss contraceptive options today? No            The pregnancy intention screening data noted above was reviewed. Potential methods of contraception were discussed. The patient elected to proceed with No data recorded.   Edinburgh Postnatal Depression Scale - 03/31/22 1047       Edinburgh Postnatal Depression Scale:  In the Past 7 Days   I have been able to laugh and see the funny side of things. 0    I have looked forward with enjoyment to things. 0    I have blamed myself unnecessarily when things went wrong. 0    I have been anxious or worried for no good reason. 0    I have felt scared or panicky for no good reason. 0    Things have been getting on top of me. 0    I have been so unhappy that I have had difficulty sleeping. 0    I have felt sad or miserable. 0    I have been so unhappy that I have been crying. 0    The thought of harming myself has occurred to me. 0    Edinburgh Postnatal Depression Scale Total 0             Health Maintenance Due  Topic Date Due   COVID-19 Vaccine (1) Never done    The following portions of the patient's history were  reviewed and updated as appropriate: allergies, current medications, past family history, past medical history, past social history, past surgical history, and problem list.  Review of Systems Pertinent items noted in HPI and remainder of comprehensive ROS otherwise negative.  Objective:  BP 119/76   Pulse 72   Ht _0  (1.778 m)   Wt 275 lb 9.6 oz (125 kg)   LMP 04/06/2021 (Exact Date)   BMI 39.54 kg/m    VS reviewed, nursing note reviewed,  Constitutional: well developed, well nourished, no distress HEENT: normocephalic CV: normal rate Pulm/chest wall: normal effort Abdomen: soft Neuro: alert and oriented x 3 Skin: warm, dry Psych: affect normal       Assessment:   1. Dizziness --Pt reports dizziness when standing started in pregnancy and continues occasionally.  - CBC - Comp Met (CMET)  2. Other fatigue  - TSH  3. Postpartum pain --Pt with pressure in vaginal area when standing or after lifting things --Low back pain  4. Chronic midline low back pain without sciatica  - Ambulatory referral to Physical Therapy  5. Pelvic pain in female  - Ambulatory referral to Physical Therapy  6. Postpartum care following vaginal delivery --Doing well,  bonding well with baby, good support at home  7. Other constipation --Using stool softeners and eating more fiber -Increase PO fluids with breastfeeding and add Mirilax daily PRN   Plan:   Essential components of care per ACOG recommendations:  1.  Mood and well being: Patient with negative depression screening today. Reviewed local resources for support.  - Patient tobacco use? No.   - hx of drug use? No.    2. Infant care and feeding:  -Patient currently breastmilk feeding? Yes. Reviewed importance of draining breast regularly to support lactation.  -Social determinants of health (SDOH) reviewed in EPIC. No concerns  3. Sexuality, contraception and birth spacing - Patient does not want a pregnancy in the next  year.   - Reviewed reproductive life planning. Reviewed contraceptive methods based on pt preferences and effectiveness.  Patient desired Abstinence today.   - Discussed birth spacing of 18 months  4. Sleep and fatigue -Encouraged family/partner/community support of 4 hrs of uninterrupted sleep to help with mood and fatigue  5. Physical Recovery  - Discussed patients delivery and complications. She describes her labor as good. - Patient had a Vaginal, no problems at delivery. Patient had a 1st degree laceration. Perineal healing reviewed. Patient expressed understanding - Patient has urinary incontinence? No. - Patient is safe to resume physical and sexual activity  6.  Health Maintenance - HM due items addressed Yes - Last pap smear  Diagnosis  Date Value Ref Range Status  07/19/2021   Final   - Negative for intraepithelial lesion or malignancy (NILM)   Pap smear not done at today's visit.  -Breast Cancer screening indicated? No.   7. Chronic Disease/Pregnancy Condition follow up: None  - PCP follow up  Fatima Blank, [place] for [place]

## 2022-04-01 LAB — COMPREHENSIVE METABOLIC PANEL
ALT: 17 IU/L (ref 0–32)
AST: 19 IU/L (ref 0–40)
Albumin/Globulin Ratio: 1.4 (ref 1.2–2.2)
Albumin: 4.3 g/dL (ref 3.9–4.9)
Alkaline Phosphatase: 99 IU/L (ref 44–121)
BUN/Creatinine Ratio: 11 (ref 9–23)
BUN: 10 mg/dL (ref 6–20)
Bilirubin Total: 0.4 mg/dL (ref 0.0–1.2)
CO2: 24 mmol/L (ref 20–29)
Calcium: 9.5 mg/dL (ref 8.7–10.2)
Chloride: 104 mmol/L (ref 96–106)
Creatinine, Ser: 0.9 mg/dL (ref 0.57–1.00)
Globulin, Total: 3.1 g/dL (ref 1.5–4.5)
Glucose: 87 mg/dL (ref 70–99)
Potassium: 4.1 mmol/L (ref 3.5–5.2)
Sodium: 140 mmol/L (ref 134–144)
Total Protein: 7.4 g/dL (ref 6.0–8.5)
eGFR: 87 mL/min/{1.73_m2} (ref >59)

## 2022-04-01 LAB — CBC
Hematocrit: 39.1 % (ref 34.0–46.6)
Hemoglobin: 12.8 g/dL (ref 11.1–15.9)
MCH: 29.4 pg (ref 26.6–33.0)
MCHC: 32.7 g/dL (ref 31.5–35.7)
MCV: 90 fL (ref 79–97)
Platelets: 319 10*3/uL (ref 150–450)
RBC: 4.35 x10E6/uL (ref 3.77–5.28)
RDW: 13.5 % (ref 11.7–15.4)
WBC: 6.1 10*3/uL (ref 3.4–10.8)

## 2022-04-01 LAB — TSH: TSH: 2.4 u[IU]/mL (ref 0.450–4.500)

## 2022-04-06 NOTE — Therapy (Incomplete)
OUTPATIENT PHYSICAL THERAPY FEMALE PELVIC EVALUATION   Patient Name: Jamie [name] MRN: [id number] DOB:10-Aug-1990, 31 y.o., female Today's Date: 04/06/2022  END OF SESSION:   No past medical history on file. Past Surgical History:  Procedure Laterality Date   NO PAST SURGERIES     Patient Active Problem List   Diagnosis Date Noted   Dizziness 03/31/2022   Other fatigue 03/31/2022   Chronic midline low back pain without sciatica 03/31/2022   Pelvic pain in female 03/31/2022   BMI 40.0-44.9, adult (HCC) 12/31/2021    PCP: NA  REFERRING PROVIDER: Hurshel Party, CNM  REFERRING DIAG: M54.50,G89.29 (ICD-10-CM) - Chronic midline low back pain without sciatica R10.2 (ICD-10-CM) - Pelvic pain in female  THERAPY DIAG:  No diagnosis found.  Rationale for Evaluation and Treatment: Rehabilitation  ONSET DATE: ***  SUBJECTIVE:                                                                                                                                                                                           04/07/22 SUBJECTIVE STATEMENT: *** Fluid intake: {Yes/No:304960894}   PAIN:  Are you having pain? {yes/no:20286} NPRS scale: ***/10 Pain location: {pelvic pain location:27098}  Pain type: {type:313116} Pain description: {PAIN DESCRIPTION:21022940}   Aggravating factors: *** Relieving factors: ***  PRECAUTIONS: None  WEIGHT BEARING RESTRICTIONS: No  FALLS:  Has patient fallen in last 6 months? No  LIVING ENVIRONMENT: Lives with: {[facility] lives with:25569::"lives with their family"} Lives in: {Lives in:25570}  OCCUPATION: ***  PLOF: Independent  PATIENT GOALS: ***  PERTINENT HISTORY:  *** Sexual abuse: {Yes/No:304960894}  BOWEL MOVEMENT: Pain with bowel movement: {yes/no:20286} Type of bowel movement:{PT BM type:27100} Fully empty rectum: {Yes/No:304960894} Leakage: {Yes/No:304960894} Pads: {Yes/No:304960894} Fiber supplement:  {Yes/No:304960894}  URINATION: Pain with urination: {yes/no:20286} Fully empty bladder: {Yes/No:304960894} Stream: {PT urination:27102} Urgency: {Yes/No:304960894} Frequency: *** Leakage: {PT leakage:27103} Pads: {Yes/No:304960894}  INTERCOURSE: Pain with intercourse: {pain with intercourse PA:27099} Ability to have vaginal penetration:  {Yes/No:304960894} Climax: *** Marinoff Scale: ***/3  PREGNANCY: Vaginal deliveries *** Tearing {Yes***/No:304960894} C-section deliveries *** Currently pregnant {Yes***/No:304960894}  PROLAPSE: {PT prolapse:27101}   OBJECTIVE:  04/07/22: DIAGNOSTIC FINDINGS:   COGNITION: Overall cognitive status: Within functional limits for tasks assessed     SENSATION: Light touch: Appears intact Proprioception: Appears intact  MUSCLE LENGTH:   LUMBAR SPECIAL TESTS:    FUNCTIONAL TESTS:    GAIT: Comments: ***  POSTURE: {posture:25561}   LUMBARAROM/PROM:  A/PROM A/PROM  eval  Flexion   Extension   Right lateral flexion   Left lateral flexion   Right rotation   Left rotation    (Blank rows = not tested)  LOWER EXTREMITY MMT:  MMT Right eval Left eval  Hip flexion    Hip extension    Hip abduction    Hip adduction    Hip internal rotation    Hip external rotation    Knee flexion    Knee extension    Ankle dorsiflexion    Ankle plantarflexion    Ankle inversion    Ankle eversion     PALPATION:   General  ***                External Perineal Exam ***                             Internal Pelvic Floor ***  Patient confirms identification and approves PT to assess internal pelvic floor and treatment {yes/no:20286}  PELVIC MMT:   MMT eval  Vaginal   Internal Anal Sphincter   External Anal Sphincter   Puborectalis   Diastasis Recti   (Blank rows = not tested)        TONE: ***  PROLAPSE: ***  TODAY'S TREATMENT:                                                                                                                               DATE: 04/07/22  EVAL  Manual:  Neuromuscular re-education: Core facilitation: Pelvic floor contraction training: Down training: Exercises: Stretches/mobility: Strengthening: Therapeutic activities: Self-care:     PATIENT EDUCATION:  Education details: *** Person educated: Patient Education method: Programmer, multimedia, Facilities manager, Actor cues, Verbal cues, and Handouts Education comprehension: {Education Comprehension:25206}  HOME EXERCISE PROGRAM: ***  ASSESSMENT:  CLINICAL IMPRESSION: Patient is a *** y.o. *** who was seen today for physical therapy evaluation and treatment for ***.   OBJECTIVE IMPAIRMENTS: {opptimpairments:25111}.   ACTIVITY LIMITATIONS: {activitylimitations:27494}  PARTICIPATION LIMITATIONS: {participationrestrictions:25113}  PERSONAL FACTORS: {Personal factors:25162} are also affecting patient's functional outcome.   REHAB POTENTIAL: Good  CLINICAL DECISION MAKING: Stable/uncomplicated  EVALUATION COMPLEXITY: Low   GOALS: Goals reviewed with patient? Yes  SHORT TERM GOALS: Target date: 05/05/22  Pt will be independent with HEP.   Baseline: Goal status: INITIAL  2.  *** Baseline:  Goal status: {GOALSTATUS:25110}  3.  *** Baseline:  Goal status: {GOALSTATUS:25110}  4.  *** Baseline:  Goal status: {GOALSTATUS:25110}  5.  *** Baseline:  Goal status: {GOALSTATUS:25110}  6.  *** Baseline:  Goal status: {GOALSTATUS:25110}  LONG TERM GOALS: Target date: 06/30/22  Pt will be independent with HEP.   Baseline:  Goal status: INITIAL  2.  *** Baseline:  Goal status: {GOALSTATUS:25110}  3.  *** Baseline:  Goal status: {GOALSTATUS:25110}  4.  *** Baseline:  Goal status: {GOALSTATUS:25110}  5.  *** Baseline:  Goal status: {GOALSTATUS:25110}  6.  *** Baseline:  Goal status: {GOALSTATUS:25110}  PLAN:  PT FREQUENCY: {rehab frequency:25116}  PT DURATION: {rehab  duration:25117}  PLANNED INTERVENTIONS: Therapeutic exercises, Therapeutic activity, Neuromuscular re-education, Balance training, Gait  training, Patient/Family education, Self Care, Joint mobilization, Dry Needling, Biofeedback, and Manual therapy  PLAN FOR NEXT SESSION: ***   Julio Alm, PT, DPT12/03/235:44 PM

## 2022-04-07 ENCOUNTER — Ambulatory Visit: Payer: POS

## 2022-08-17 IMAGING — US US OB < 14 WEEKS - US OB TV
1 series · 15 of 28 positions shown · non-contrast
Comparison: None.

CLINICAL DATA: Vaginal bleeding in early pregnancy.

EXAM:
OBSTETRIC <14 WK ULTRASOUND
TECHNIQUE: Transabdominal ultrasound was performed for evaluation of the
gestation as well as the maternal uterus and adnexal regions.

[Series 1: us ob < 14 weeks - us ob tv · 15 of 51 slices shown]
[im 1/51]
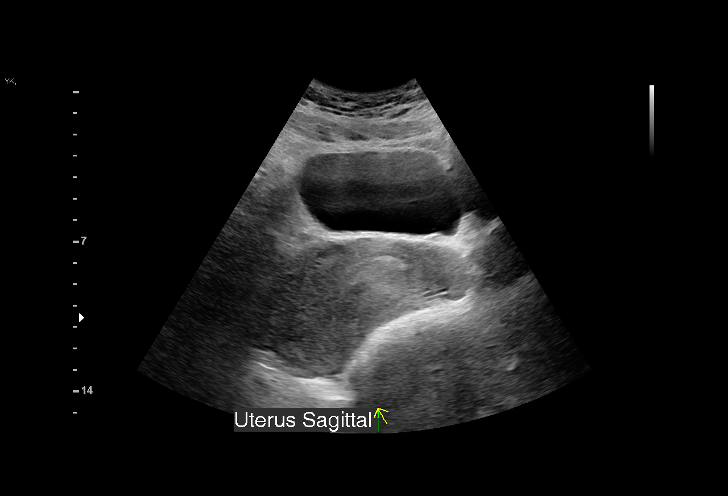
[im 4/51]
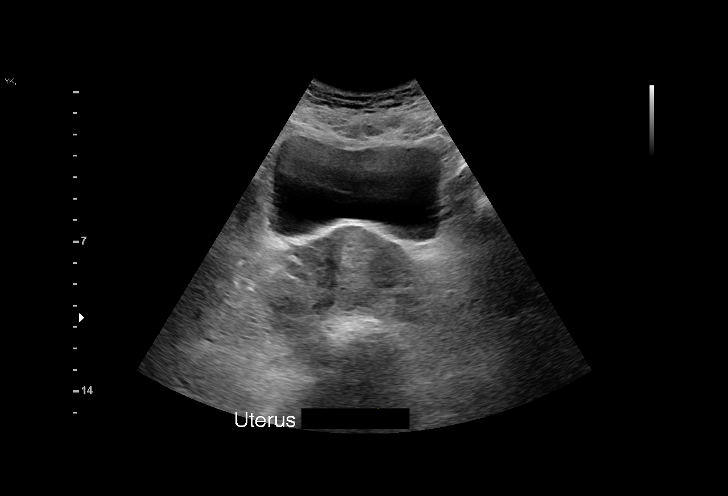
[im 8/51]
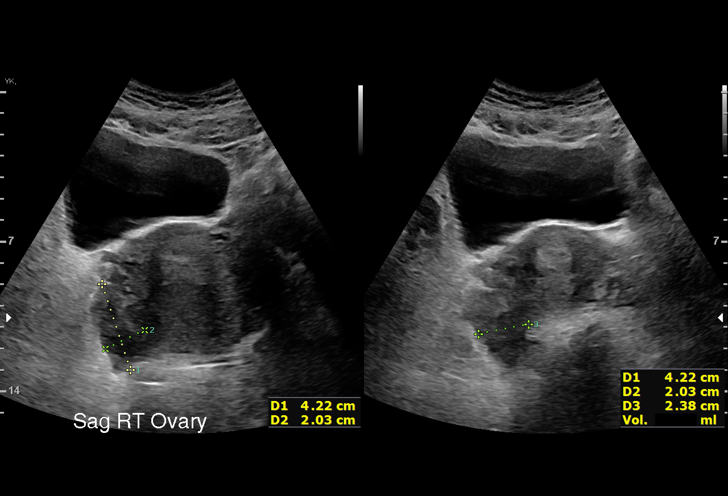
[im 12/51]
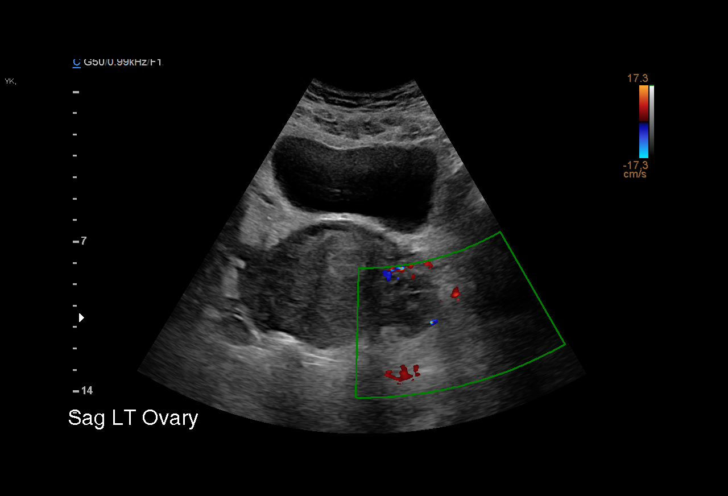
[im 15/51]
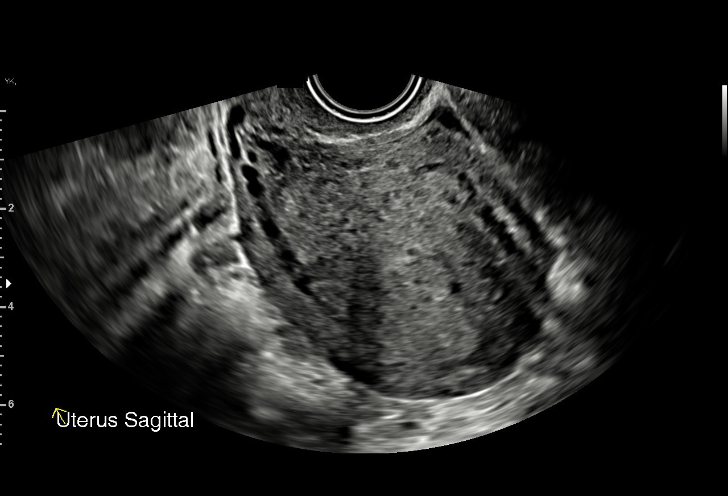
[im 19/51]
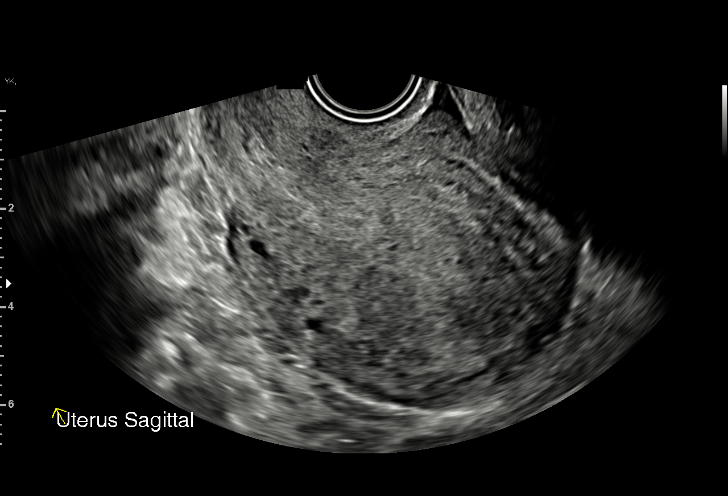
[im 23/51]
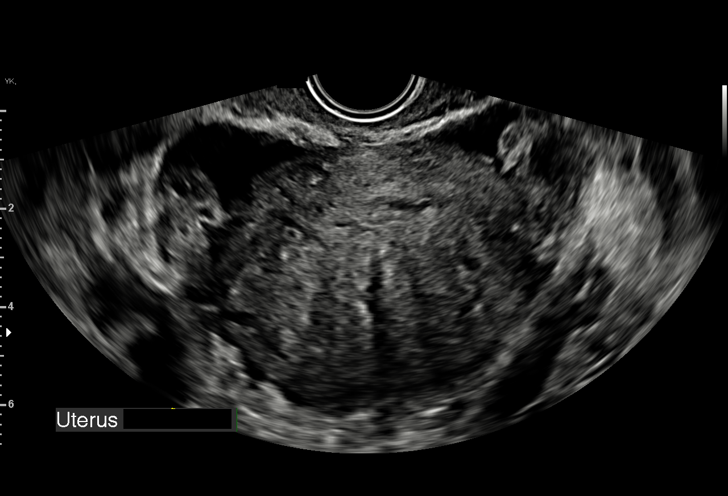
[im 26/51]
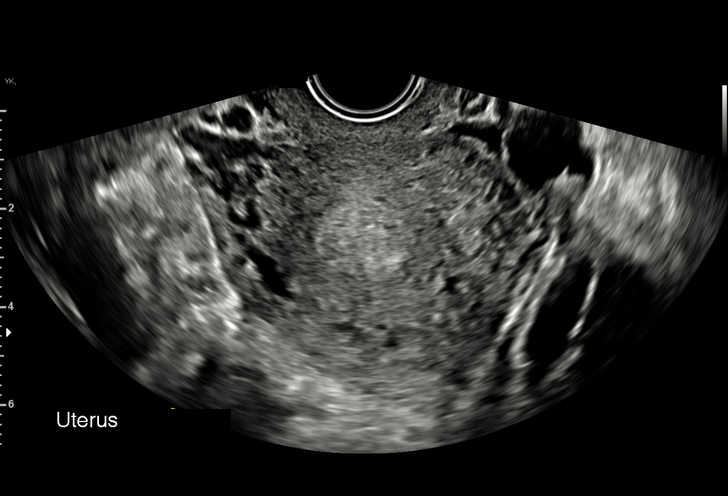
[im 28/51]
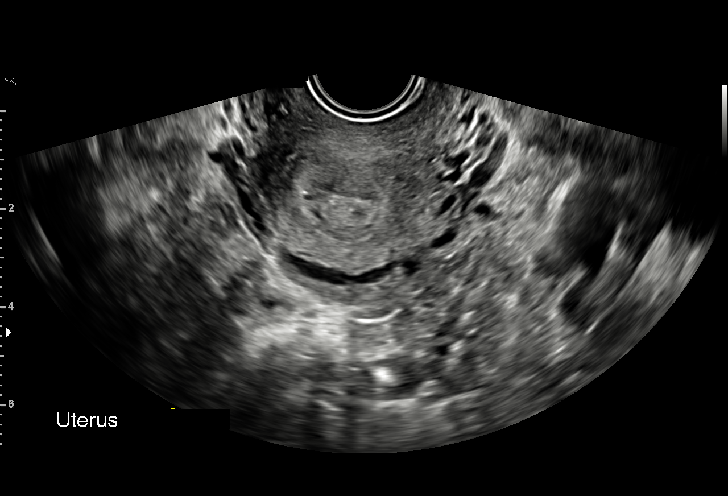
[im 32/51]
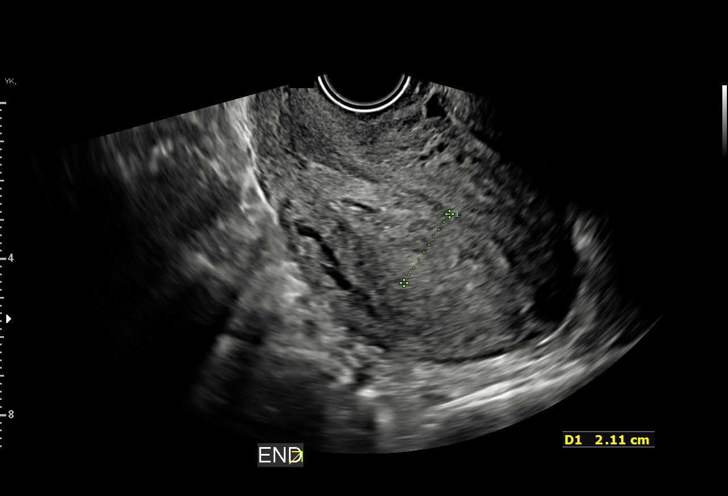
[im 36/51]
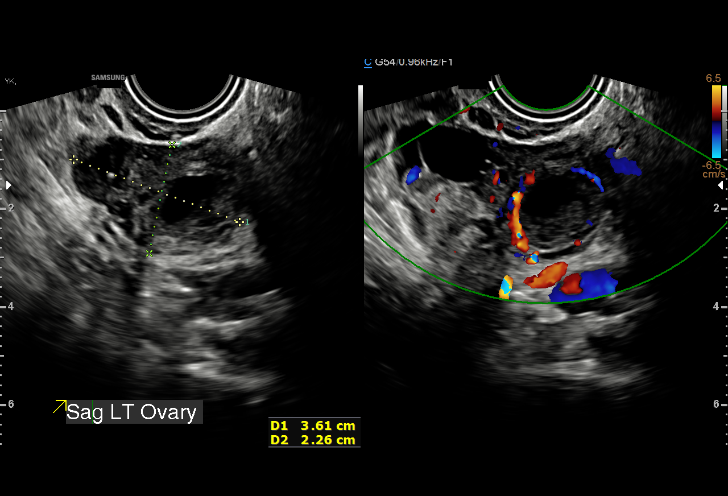
[im 39/51]
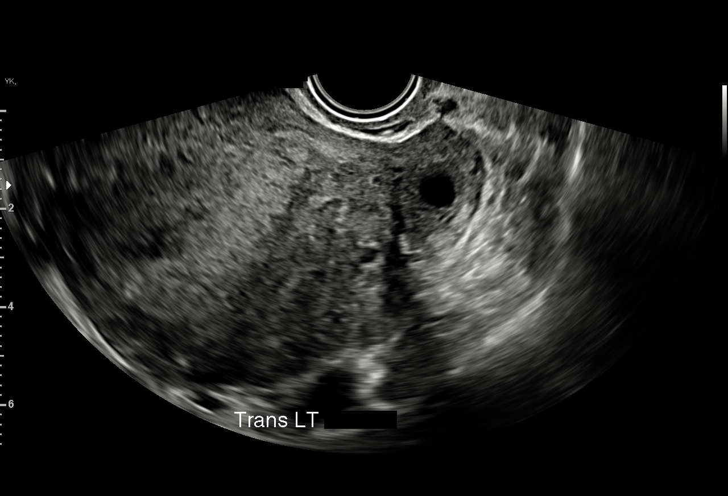
[im 43/51]
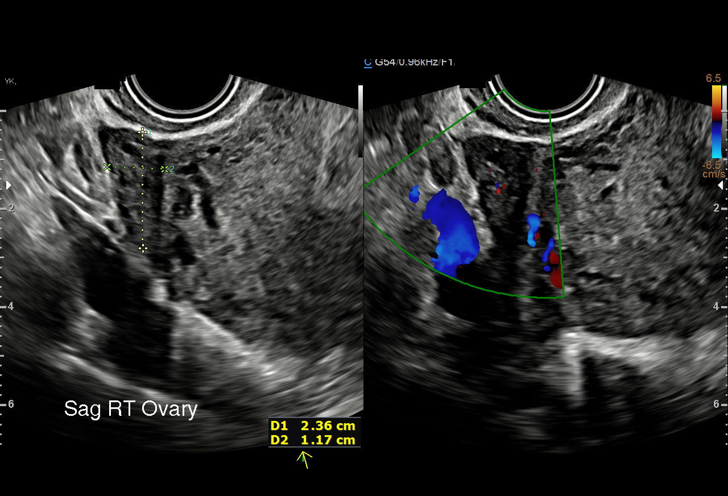
[im 47/51]
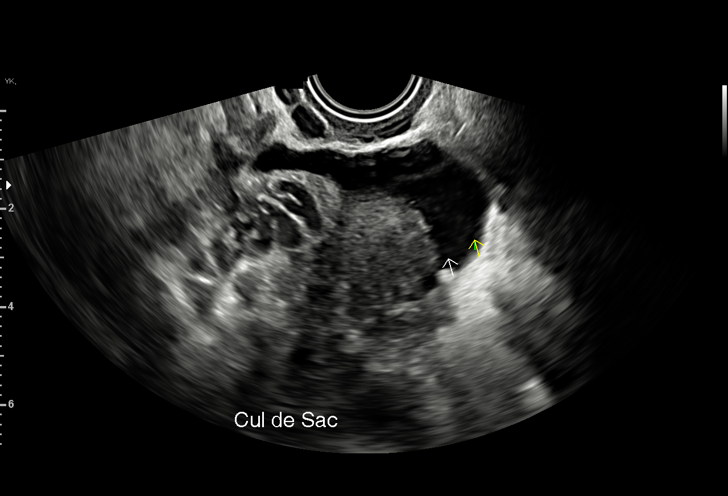
[im 51/51]
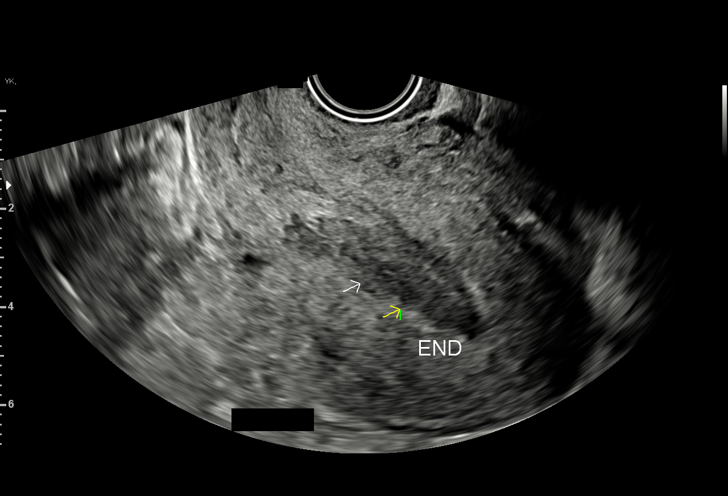

[15 of 28 positions shown; findings below may reference images not displayed]

FINDINGS: Intrauterine gestational sac: None

Yolk sac:  Not Visualized.

Embryo:  Not Visualized.

Cardiac Activity: Not Visualized.

Subchorionic hemorrhage:  None visualized.

Maternal uterus/adnexae: Normal appearance of the bilateral ovaries.
Mild free fluid in the cul-de-sac.
IMPRESSION: 1. No IUP is visualized. By definition, in the setting of a positive
pregnancy test, this reflects a pregnancy of unknown location.
Differential considerations include early normal IUP, abnormal
IUP/missed abortion, or nonvisualized ectopic pregnancy. Serial beta
HCG is suggested. Consider repeat pelvic ultrasound in 14 days.

2.  Mild free fluid in the pelvis.

## 2023-01-09 ENCOUNTER — Encounter (HOSPITAL_BASED_OUTPATIENT_CLINIC_OR_DEPARTMENT_OTHER): Payer: Self-pay | Admitting: Emergency Medicine

## 2023-01-09 ENCOUNTER — Emergency Department (HOSPITAL_BASED_OUTPATIENT_CLINIC_OR_DEPARTMENT_OTHER): Payer: POS

## 2023-01-09 ENCOUNTER — Emergency Department (HOSPITAL_BASED_OUTPATIENT_CLINIC_OR_DEPARTMENT_OTHER)
Admission: EM | Admit: 2023-01-09 | Discharge: 2023-01-09 | Disposition: A | Discharge status: Home / Self Care | Payer: POS | Attending: Emergency Medicine | Admitting: Emergency Medicine

## 2023-01-09 DIAGNOSIS — J189 Pneumonia, unspecified organism: Secondary | ICD-10-CM | POA: Insufficient documentation

## 2023-01-09 DIAGNOSIS — Z7982 Long term (current) use of aspirin: Secondary | ICD-10-CM | POA: Insufficient documentation

## 2023-01-09 DIAGNOSIS — Z1152 Encounter for screening for COVID-19: Secondary | ICD-10-CM | POA: Insufficient documentation

## 2023-01-09 DIAGNOSIS — R0602 Shortness of breath: Secondary | ICD-10-CM | POA: Diagnosis present

## 2023-01-09 HISTORY — DX: Unspecified asthma, uncomplicated: J45.909

## 2023-01-09 LAB — CBC
HCT: 37.6 % (ref 36.0–46.0)
Hemoglobin: 12.4 g/dL (ref 12.0–15.0)
MCH: 29.6 pg (ref 26.0–34.0)
MCHC: 33 g/dL (ref 30.0–36.0)
MCV: 89.7 fL (ref 80.0–100.0)
Platelets: 254 10*3/uL (ref 150–400)
RBC: 4.19 MIL/uL (ref 3.87–5.11)
RDW: 12.8 % (ref 11.5–15.5)
WBC: 6.4 10*3/uL (ref 4.0–10.5)
nRBC: 0 % (ref 0.0–0.2)

## 2023-01-09 LAB — RESP PANEL BY RT-PCR (RSV, FLU A&B, COVID)  RVPGX2
Influenza A by PCR: NEGATIVE
Influenza B by PCR: NEGATIVE
Resp Syncytial Virus by PCR: NEGATIVE
SARS Coronavirus 2 by RT PCR: NEGATIVE

## 2023-01-09 LAB — D-DIMER, QUANTITATIVE: D-Dimer, Quant: 0.92 ug{FEU}/mL — ABNORMAL HIGH (ref 0.00–0.50)

## 2023-01-09 LAB — TROPONIN I (HIGH SENSITIVITY): Troponin I (High Sensitivity): <2 ng/L (ref <18)

## 2023-01-09 LAB — BASIC METABOLIC PANEL
Anion gap: 7 (ref 5–15)
BUN: 11 mg/dL (ref 6–20)
CO2: 24 mmol/L (ref 22–32)
Calcium: 8.7 mg/dL — ABNORMAL LOW (ref 8.9–10.3)
Chloride: 101 mmol/L (ref 98–111)
Creatinine, Ser: 0.86 mg/dL (ref 0.44–1.00)
GFR, Estimated: >60 mL/min (ref >60)
Glucose, Bld: 107 mg/dL — ABNORMAL HIGH (ref 70–99)
Potassium: 3.6 mmol/L (ref 3.5–5.1)
Sodium: 132 mmol/L — ABNORMAL LOW (ref 135–145)

## 2023-01-09 MED ORDER — ALBUTEROL SULFATE HFA 108 (90 BASE) MCG/ACT IN AERS: 2.0000 | INHALATION_SPRAY | RESPIRATORY_TRACT | Status: DC | PRN

## 2023-01-09 MED ORDER — IOHEXOL 350 MG/ML SOLN
100.0000 mL | Freq: Once | INTRAVENOUS | Status: AC | PRN
  Administered 2023-01-09: 75 mL via INTRAVENOUS

## 2023-01-09 MED ORDER — ALBUTEROL SULFATE HFA 108 (90 BASE) MCG/ACT IN AERS
2.0000 | INHALATION_SPRAY | Freq: Once | RESPIRATORY_TRACT | Status: AC
  Administered 2023-01-09: 2 via RESPIRATORY_TRACT
  Filled 2023-01-09: qty 6.7

## 2023-01-09 MED ORDER — DOXYCYCLINE HYCLATE 100 MG PO CAPS: 100.0000 mg | ORAL_CAPSULE | Freq: Two times a day (BID) | ORAL | 0 refills | Status: DC

## 2023-01-09 MED ORDER — DOXYCYCLINE HYCLATE 100 MG PO TABS
100.0000 mg | ORAL_TABLET | Freq: Once | ORAL | Status: AC
  Administered 2023-01-09: 100 mg via ORAL
  Filled 2023-01-09: qty 1

## 2023-01-09 MED ORDER — AEROCHAMBER PLUS FLO-VU MEDIUM MISC
1.0000 | Freq: Once | Status: AC
  Administered 2023-01-09: 1
  Filled 2023-01-09: qty 1

## 2023-01-09 NOTE — ED Notes (Signed)
RT asked to assess pt BS. Pt respiratory status stable on RA w/no distress noted at this time. Pt BLBS clear posteriorly. Pt w/hx of reactive airway/asthma. Pt has never seen pulmonology for PFT.     01/09/23 2000  Therapy Vitals  Patient Position (if appropriate) Sitting  Respiratory Assessment  $ RT Protocol Assessment  Yes  Assessment Type Assess only  Respiratory Pattern Regular;Unlabored;Dyspnea at rest;Symmetrical  Chest Assessment Chest expansion symmetrical  Cough None  Bilateral Breath Sounds Clear  R Upper  Breath Sounds Clear  L Upper Breath Sounds Clear  R Lower Breath Sounds Clear  L Lower Breath Sounds Clear  Oxygen Therapy/Pulse Ox  O2 Device Room Air  O2 Therapy Room air

## 2023-01-09 NOTE — ED Triage Notes (Signed)
Sob, heaviness on chest, dizziness  cough Started yesterday

## 2023-01-09 NOTE — Discharge Instructions (Signed)
Follow-up with primary care doctor.  Return if symptoms worsen.  Use albuterol inhaler as needed.  Take next dose antibiotic tomorrow.

## 2023-01-09 NOTE — ED Provider Notes (Signed)
[place] EMERGENCY DEPARTMENT AT [facility] Provider Note   CSN: [id number] Arrival date & time: 01/09/23  1834     History  No chief complaint on file.   Jamie [name] is a 32 y.o. female.  Patient here with cough shortness of breath.  Symptoms for the last few days.  Nothing makes it worse or better.  History of asthma as a child.  No recent surgery travel.  Currently on antibiotics for cellulitis on her foot.  She denies any fever chills weakness numbness tingling.  But did feel little bit lightheaded.  No nausea vomiting diarrhea or abdominal pain.  No chest pain.  The history is provided by the patient.       Home Medications Prior to Admission medications   Medication Sig Start Date End Date Taking? Authorizing Provider  doxycycline (VIBRAMYCIN) 100 MG capsule Take 1 capsule (100 mg total) by mouth 2 (two) times daily. 01/09/23  Yes Ricardo Schubach, DO  aspirin EC 81 MG tablet Take 1 tablet (81 mg total) by mouth daily. Patient not taking: Reported on 03/31/2022 08/04/21   Gerrit Heck, CNM  ferrous sulfate 325 (65 FE) MG tablet Take 325 mg by mouth daily with breakfast. Patient not taking: Reported on 03/31/2022    [provider]  Prenatal MV & Min w/FA-DHA (PRENATAL GUMMIES PO) Take by mouth.    [provider]      Allergies    Shellfish allergy, Lactose, and Prednisone    Review of Systems   Review of Systems  Physical Exam Updated Vital Signs BP 115/64 (BP Location: Right Arm)   Pulse (!) 111   Temp 98.7 F (37.1 C)   Resp (!) 24   LMP 12/11/2022   SpO2 100%   Breastfeeding Yes  Physical Exam Vitals and nursing note reviewed.  Constitutional:      General: She is not in acute distress.    Appearance: She is well-developed. She is not ill-appearing.  HENT:     Head: Normocephalic and atraumatic.     Nose: Nose normal.     Mouth/Throat:     Mouth: Mucous membranes are moist.  Eyes:     Conjunctiva/sclera: Conjunctivae  normal.     Pupils: Pupils are equal, round, and reactive to light.  Cardiovascular:     Rate and Rhythm: Normal rate and regular rhythm.     Pulses: Normal pulses.     Heart sounds: Normal heart sounds. No murmur heard. Pulmonary:     Effort: Pulmonary effort is normal. No respiratory distress.     Breath sounds: Wheezing present.  Abdominal:     Palpations: Abdomen is soft.     Tenderness: There is no abdominal tenderness.  Musculoskeletal:        General: No swelling.     Cervical back: Neck supple.  Skin:    General: Skin is warm and dry.     Capillary Refill: Capillary refill takes less than 2 seconds.  Neurological:     General: No focal deficit present.     Mental Status: She is alert.  Psychiatric:        Mood and Affect: Mood normal.     ED Results / Procedures / Treatments   Labs (all labs ordered are listed, but only abnormal results are displayed) Labs Reviewed  BASIC METABOLIC PANEL - Abnormal; Notable for the following components:      Result Value   Sodium 132 (*)    Glucose, Bld 107 (*)  Calcium 8.7 (*)    All other components within normal limits  D-DIMER, QUANTITATIVE - Abnormal; Notable for the following components:   D-Dimer, Quant 0.92 (*)    All other components within normal limits  RESP PANEL BY RT-PCR (RSV, FLU A&B, COVID)  [id number]  CBC  TROPONIN I (HIGH SENSITIVITY)    EKG EKG Interpretation Date/Time:  Friday January 09 2023 18:46:59 EDT Ventricular Rate:  120 PR Interval:  140 QRS Duration:  68 QT Interval:  278 QTC Calculation: 392 R Axis:   44  Text Interpretation: Sinus tachycardia No previous ECGs available Confirmed by Virgina Norfolk [phone]) on 01/09/2023 7:53:59 PM  Radiology CT Angio Chest PE W and/or Wo Contrast  Result Date: 01/09/2023 CLINICAL DATA:  Shortness of breath with chest heaviness and dizziness. EXAM: CT ANGIOGRAPHY CHEST WITH CONTRAST TECHNIQUE: Multidetector CT imaging of the chest was performed using the  standard protocol during bolus administration of intravenous contrast. Multiplanar CT image reconstructions and MIPs were obtained to evaluate the vascular anatomy. RADIATION DOSE REDUCTION: This exam was performed according to the departmental dose-optimization program which includes automated exposure control, adjustment of the mA and/or kV according to patient size and/or use of iterative reconstruction technique. CONTRAST:  75mL OMNIPAQUE IOHEXOL 350 MG/ML SOLN COMPARISON:  None Available. FINDINGS: Cardiovascular: The thoracic aorta is normal in appearance. Satisfactory opacification of the pulmonary arteries to the segmental level. No evidence of pulmonary embolism. Normal heart size. No pericardial effusion. Mediastinum/Nodes: No enlarged mediastinal, hilar, or axillary lymph nodes. Thyroid gland, trachea, and esophagus demonstrate no significant findings. Lungs/Pleura: Mild patchy infiltrate is seen within the posterolateral aspect of the right lower lobe. No pleural effusion or pneumothorax is identified. Upper Abdomen: No acute abnormality. Musculoskeletal: No chest wall abnormality. No acute or significant osseous findings. Review of the MIP images confirms the above findings. IMPRESSION: 1. No evidence of pulmonary embolism. 2. Mild right lower lobe infiltrate. Electronically Signed   By: Aram Candela M.D.   On: 01/09/2023 20:51    Procedures Procedures    Medications Ordered in ED Medications  AeroChamber Plus Flo-Vu Medium MISC 1 each (has no administration in time range)  doxycycline (VIBRA-TABS) tablet 100 mg (has no administration in time range)  albuterol (VENTOLIN HFA) 108 (90 Base) MCG/ACT inhaler 2 puff (has no administration in time range)  iohexol (OMNIPAQUE) 350 MG/ML injection 100 mL (75 mLs Intravenous Contrast Given 01/09/23 2016)    ED Course/ Medical Decision Making/ A&P                                 Medical Decision Making Amount and/or Complexity of Data  Reviewed Labs: ordered. Radiology: ordered.  Risk Prescription drug management.   Jamie [name] is here with cough shortness of breath dizziness maybe some chest pain but now resolved.  History of maybe some reactive airway disease.  Overall she appears well.  Very faint wheeze on exam.  But no signs of respiratory distress.  With diagnosis ACS versus PE versus pneumonia versus viral process.  Will check CBC, BMP, COVID flu test, D-dimer, troponin.  EKG shows sinus rhythm.  No ischemic changes.  Per my review interpretation the labs troponins normal.  Have no concern for ACS.  COVID and flu test negative.  No significant anemia leukocytosis or electrolyte abnormality.  Dimer was elevated therefore PE scan was performed which showed right lower pneumonia.  Antibiotics to be started.  She is  not septic.  Well-appearing.Will continue antibiotics at home.  Understands return precautions.  Albuterol as needed.  Discharged in good condition.  No pulmonary embolism seen on CT.  This chart was dictated using voice recognition software.  Despite best efforts to proofread,  errors can occur which can change the documentation meaning.         Final Clinical Impression(s) / ED Diagnoses Final diagnoses:  Community acquired pneumonia, unspecified laterality    Rx / DC Orders ED Discharge Orders          Ordered    doxycycline (VIBRAMYCIN) 100 MG capsule  2 times daily        01/09/23 2113              Virgina Norfolk, DO 01/09/23 2116

## 2023-04-15 ENCOUNTER — Ambulatory Visit: Payer: Medicaid Other | Admitting: *Deleted

## 2023-04-15 DIAGNOSIS — Z3200 Encounter for pregnancy test, result unknown: Secondary | ICD-10-CM

## 2023-04-15 DIAGNOSIS — N926 Irregular menstruation, unspecified: Secondary | ICD-10-CM

## 2023-04-15 DIAGNOSIS — Z789 Other specified health status: Secondary | ICD-10-CM

## 2023-04-15 NOTE — Progress Notes (Signed)
Jamie [name] presents today for UPT. She has no unusual complaints  LMP: unsure- last known Sept     OBJECTIVE: Appears well, in no apparent distress.  OB History     Gravida  4   Para  3   Term  3   Preterm      AB  1   Living  3      SAB  1   IAB      Ectopic      Multiple  0   Live Births  3          Home UPT Result:Positive In-Office UPT result:positive  I have reviewed the patient's medical, obstetrical, social, and family histories, and medications.   ASSESSMENT: Positive pregnancy test  PLAN Prenatal care to be completed at: [facility] Quant ordered for unsure dating

## 2023-04-16 LAB — BETA HCG QUANT (REF LAB): hCG Quant: 172053 m[IU]/mL

## 2023-04-30 ENCOUNTER — Encounter: Payer: Self-pay | Admitting: Obstetrics and Gynecology

## 2023-04-30 ENCOUNTER — Other Ambulatory Visit (INDEPENDENT_AMBULATORY_CARE_PROVIDER_SITE_OTHER): Payer: 59

## 2023-04-30 ENCOUNTER — Other Ambulatory Visit (HOSPITAL_COMMUNITY)
Admission: RE | Admit: 2023-04-30 | Discharge: 2023-04-30 | Disposition: A | Discharge status: Home / Self Care | Payer: 59 | Source: Ambulatory Visit | Attending: Obstetrics and Gynecology | Admitting: Obstetrics and Gynecology

## 2023-04-30 ENCOUNTER — Ambulatory Visit (INDEPENDENT_AMBULATORY_CARE_PROVIDER_SITE_OTHER): Payer: 59 | Admitting: *Deleted

## 2023-04-30 VITALS — BP 133/76 | HR 98 | Wt 271.1 lb

## 2023-04-30 DIAGNOSIS — Z3480 Encounter for supervision of other normal pregnancy, unspecified trimester: Secondary | ICD-10-CM

## 2023-04-30 DIAGNOSIS — Z1339 Encounter for screening examination for other mental health and behavioral disorders: Secondary | ICD-10-CM

## 2023-04-30 DIAGNOSIS — Z3481 Encounter for supervision of other normal pregnancy, first trimester: Secondary | ICD-10-CM

## 2023-04-30 DIAGNOSIS — Z3A11 11 weeks gestation of pregnancy: Secondary | ICD-10-CM

## 2023-04-30 DIAGNOSIS — O3680X0 Pregnancy with inconclusive fetal viability, not applicable or unspecified: Secondary | ICD-10-CM

## 2023-04-30 DIAGNOSIS — Z3A10 10 weeks gestation of pregnancy: Secondary | ICD-10-CM | POA: Diagnosis not present

## 2023-04-30 DIAGNOSIS — O219 Vomiting of pregnancy, unspecified: Secondary | ICD-10-CM

## 2023-04-30 MED ORDER — PROMETHAZINE HCL 25 MG PO TABS
25.0000 mg | ORAL_TABLET | Freq: Four times a day (QID) | ORAL | 2 refills | Status: DC | PRN
Start: 2023-04-30 — End: 2023-09-15

## 2023-04-30 NOTE — Patient Instructions (Signed)

## 2023-04-30 NOTE — Progress Notes (Signed)
New OB Intake  I connected with [name]  on 04/30/23 at  3:10 PM EST by In Person Visit and verified that I am speaking with the correct person using two identifiers. Nurse is located at [facility] and pt is located at [place].  I discussed the limitations, risks, security and privacy concerns of performing an evaluation and management service by telephone and the availability of in person appointments. I also discussed with the patient that there may be a patient responsible charge related to this service. The patient expressed understanding and agreed to proceed.  I explained I am completing New OB Intake today. We discussed EDD of 11/04/2023, by Last Menstrual Period. Pt is [id number]. I reviewed her allergies, medications and Medical/Surgical/OB history.    Patient Active Problem List   Diagnosis Date Noted   Supervision of other normal pregnancy, antepartum 04/30/2023   Dizziness 03/31/2022   Other fatigue 03/31/2022   Chronic midline low back pain without sciatica 03/31/2022   Pelvic pain in female 03/31/2022   BMI 40.0-44.9, adult (HCC) 12/31/2021    Concerns addressed today  Delivery Plans Plans to deliver at [facility]. Discussed the nature of our practice with multiple providers including residents and students. Due to the size of the practice, the delivering provider may not be the same as those providing prenatal care.   Patient is not interested in water birth. Offered upcoming OB visit with CNM to discuss further.  MyChart/Babyscripts MyChart access verified. I explained pt will have some visits in office and some virtually. Babyscripts instructions given and order placed. Patient verifies receipt of registration text/e-mail. Account successfully created and app downloaded. If patient is a candidate for Optimized scheduling, add to sticky note.   Blood Pressure Cuff/Weight Scale Pt has BP cuff at home Explained after first prenatal appt pt will check weekly and document in  Babyscripts. Patient does not have weight scale; patient may purchase if they desire to track weight weekly in Babyscripts.  Anatomy US Explained first scheduled [place] will be around 19 weeks. Anatomy US scheduled for TBD at TBD.  Interested in [place]? If yes, send referral and doula dot phrase.   Is patient a candidate for Babyscripts Optimization? No, due to Risk Factors   First visit review I reviewed new OB appt with patient. Explained pt will be seen by Sharen Counter, CNM at first visit. Discussed Avelina Laine genetic screening with patient. Request Panorama. Routine prenatal labs  collected at today's visit.    Last Pap Diagnosis  Date Value Ref Range Status  07/19/2021   Final   - Negative for intraepithelial lesion or malignancy (NILM)    Harrel Lemon, RN 04/30/2023  3:47 PM

## 2023-05-01 LAB — CBC/D/PLT+RPR+RH+ABO+RUBIGG...
Antibody Screen: NEGATIVE
Basophils Absolute: 0 10*3/uL (ref 0.0–0.2)
Basos: 0 %
EOS (ABSOLUTE): 0.2 10*3/uL (ref 0.0–0.4)
Eos: 2 %
HCV Ab: NONREACTIVE
HIV Screen 4th Generation wRfx: NONREACTIVE
Hematocrit: 36.5 % (ref 34.0–46.6)
Hemoglobin: 11.9 g/dL (ref 11.1–15.9)
Hepatitis B Surface Ag: NEGATIVE
Immature Grans (Abs): 0 10*3/uL (ref 0.0–0.1)
Immature Granulocytes: 0 %
Lymphocytes Absolute: 2.8 10*3/uL (ref 0.7–3.1)
Lymphs: 34 %
MCH: 30.2 pg (ref 26.6–33.0)
MCHC: 32.6 g/dL (ref 31.5–35.7)
MCV: 93 fL (ref 79–97)
Monocytes Absolute: 0.5 10*3/uL (ref 0.1–0.9)
Monocytes: 6 %
Neutrophils Absolute: 4.7 10*3/uL (ref 1.4–7.0)
Neutrophils: 58 %
Platelets: 331 10*3/uL (ref 150–450)
RBC: 3.94 x10E6/uL (ref 3.77–5.28)
RDW: 13.2 % (ref 11.7–15.4)
RPR Ser Ql: NONREACTIVE
Rh Factor: POSITIVE
Rubella Antibodies, IGG: 2.22 {index} (ref >0.99)
WBC: 8.2 10*3/uL (ref 3.4–10.8)

## 2023-05-01 LAB — COMPREHENSIVE METABOLIC PANEL
ALT: 8 [IU]/L (ref 0–32)
AST: 13 [IU]/L (ref 0–40)
Albumin: 4.3 g/dL (ref 3.9–4.9)
Alkaline Phosphatase: 64 [IU]/L (ref 44–121)
BUN/Creatinine Ratio: 9 (ref 9–23)
BUN: 7 mg/dL (ref 6–20)
Bilirubin Total: 0.2 mg/dL (ref 0.0–1.2)
CO2: 23 mmol/L (ref 20–29)
Calcium: 9.3 mg/dL (ref 8.7–10.2)
Chloride: 101 mmol/L (ref 96–106)
Creatinine, Ser: 0.75 mg/dL (ref 0.57–1.00)
Globulin, Total: 2.7 g/dL (ref 1.5–4.5)
Glucose: 83 mg/dL (ref 70–99)
Potassium: 3.9 mmol/L (ref 3.5–5.2)
Sodium: 138 mmol/L (ref 134–144)
Total Protein: 7 g/dL (ref 6.0–8.5)
eGFR: 108 mL/min/{1.73_m2} (ref >59)

## 2023-05-01 LAB — HEMOGLOBIN A1C
Est. average glucose Bld gHb Est-mCnc: 111 mg/dL
Hgb A1c MFr Bld: 5.5 % (ref 4.8–5.6)

## 2023-05-01 LAB — HCV INTERPRETATION

## 2023-05-02 LAB — URINE CULTURE, OB REFLEX

## 2023-05-02 LAB — CULTURE, OB URINE

## 2023-05-04 LAB — CERVICOVAGINAL ANCILLARY ONLY
Chlamydia: NEGATIVE
Comment: NEGATIVE
Comment: NORMAL
Neisseria Gonorrhea: NEGATIVE

## 2023-05-06 LAB — PANORAMA PRENATAL TEST FULL PANEL:PANORAMA TEST PLUS 5 ADDITIONAL MICRODELETIONS: FETAL FRACTION: 10.3

## 2023-05-06 NOTE — L&D Delivery Note (Signed)
 OB/GYN Faculty Practice Delivery Note  Jamie [name] is a 32 y.o. [id number] s/p SVD at [redacted]w[redacted]d. She was admitted for IOL for BMI.   ROM: 5h 61m with clear fluid GBS Status:  Negative/-- (06/25 1016) Maximum Maternal Temperature: 98.83F  Labor Progress: Initial SVE: 3/60-70-2. She then progressed to complete.   Delivery Date/Time: [phone] SVD Delivery: Called to room and patient was complete and pushing. Head delivered DOA with compound hand. No nuchal cord present. Shoulder and body delivered in usual fashion. Infant with spontaneous cry, placed on mother's abdomen, dried and stimulated. Cord clamped x 2 after 1-minute delay, and cut by FOB. Cord blood drawn. Placenta delivered spontaneously with gentle cord traction. Fundus firm with massage and Pitocin . Labia, perineum, vagina, and cervix inspected with second degree laceration repaired in usual, running fashion. Mom and baby doing well.   Baby Weight: pending  Placenta: 3 vessel, intact. Sent to L&D Complications: None Lacerations: as above EBL: 177 mL Analgesia: Epidural   Infant:  APGAR (1 MIN): 9  APGAR (5 MINS): 9   Augustin JAYSON Slade, MD [facility] Family Medicine Fellow, [facility] for [facility], [facility] 11/18/2023, 10:00 AM

## 2023-05-18 ENCOUNTER — Encounter: Payer: Self-pay | Admitting: *Deleted

## 2023-05-26 ENCOUNTER — Ambulatory Visit: Payer: Medicaid Other | Admitting: Advanced Practice Midwife

## 2023-05-26 ENCOUNTER — Encounter: Payer: Self-pay | Admitting: Advanced Practice Midwife

## 2023-05-26 VITALS — BP 127/73 | HR 95 | Wt 270.0 lb

## 2023-05-26 DIAGNOSIS — Z3A14 14 weeks gestation of pregnancy: Secondary | ICD-10-CM

## 2023-05-26 DIAGNOSIS — M5430 Sciatica, unspecified side: Secondary | ICD-10-CM

## 2023-05-26 DIAGNOSIS — O09892 Supervision of other high risk pregnancies, second trimester: Secondary | ICD-10-CM | POA: Diagnosis not present

## 2023-05-26 DIAGNOSIS — Z3480 Encounter for supervision of other normal pregnancy, unspecified trimester: Secondary | ICD-10-CM

## 2023-05-26 DIAGNOSIS — O26892 Other specified pregnancy related conditions, second trimester: Secondary | ICD-10-CM

## 2023-05-26 DIAGNOSIS — Z6841 Body Mass Index (BMI) 40.0 and over, adult: Secondary | ICD-10-CM | POA: Diagnosis not present

## 2023-05-26 DIAGNOSIS — O26893 Other specified pregnancy related conditions, third trimester: Secondary | ICD-10-CM

## 2023-05-26 DIAGNOSIS — R102 Pelvic and perineal pain: Secondary | ICD-10-CM

## 2023-05-26 MED ORDER — ACETAMINOPHEN 325 MG PO TABS
1000.0000 mg | ORAL_TABLET | Freq: Three times a day (TID) | ORAL | 0 refills | Status: DC | PRN
Start: 2023-05-26 — End: 2023-09-15

## 2023-05-26 NOTE — Progress Notes (Signed)
History:   Jamie [name] is a 33 y.o. [id number] at [redacted]w[redacted]d by early ultrasound being seen today for her first obstetrical visit.  Her obstetrical history is significant for obesity and short interval  pregnacy  . Patient does intend to breast feed. Pregnancy history fully reviewed.  Patient reports backache and sciatic pain with pelvic pressure and job concerns with heavy lifting requesting accomodation letter  .      HISTORY: OB History  Gravida Para Term Preterm AB Living  5 3 3  0 1 3  SAB IAB Ectopic Multiple Live Births  1 0 0 0 3    # Outcome Date GA Lbr Len/2nd Weight Sex Type Anes PTL Lv  5 Current           4 Term 01/29/22 [redacted]w[redacted]d 03:09 / 00:03 7 lb 4.4 oz (3.3 kg) F Vag-Spont None  LIV     Name: Hofbauer,GIRL Meko     Apgar1: 9  Apgar5: 9  3 SAB 12/04/20 [redacted]w[redacted]d         2 Term 01/05/17 [redacted]w[redacted]d 06:25 / 00:03 8 lb 13.8 oz (4.02 kg) M Vag-Spont Local  LIV     Name: Yeo,BOY Moesha     Apgar1: 8  Apgar5: 9  1 Term 05/22/13 [redacted]w[redacted]d 09:35 / 00:18 7 lb 4.8 oz (3.311 kg) M Vag-Spont None  LIV     Name: Cordial,BOY Porter     Apgar1: 8  Apgar5: 9    Last pap smear was done 07/19/21 and was normal  Past Medical History:  Diagnosis Date   Asthma    Past Surgical History:  Procedure Laterality Date   NO PAST SURGERIES     Family History  Problem Relation Age of Onset   Heart murmur Mother    Hypertension Mother    Diabetes Father    Stroke Father    Heart disease Father    Heart failure Father    Arrhythmia Brother    Healthy Brother    Diabetes Maternal Grandfather    Heart disease Maternal Grandfather    Hypertension Maternal Grandfather    Heart attack Paternal Grandmother    Social History   Tobacco Use   Smoking status: Never   Smokeless tobacco: Never  Vaping Use   Vaping status: Never Used  Substance Use Topics   Alcohol use: Not Currently    Comment: socially, before pregnancy   Drug use: No   Allergies  Allergen Reactions   Shellfish Allergy Anaphylaxis and  Swelling    Throat swelling   Lactose Diarrhea   Prednisone Other (See Comments)    "flutters"  bradycardia    Current Outpatient Medications on File Prior to Visit  Medication Sig Dispense Refill   Prenatal MV & Min w/FA-DHA (PRENATAL GUMMIES PO) Take by mouth.     aspirin EC 81 MG tablet Take 1 tablet (81 mg total) by mouth daily. (Patient not taking: Reported on 03/31/2022) 60 tablet 2   doxycycline (VIBRAMYCIN) 100 MG capsule Take 1 capsule (100 mg total) by mouth 2 (two) times daily. (Patient not taking: Reported on 05/26/2023) 20 capsule 0   ferrous sulfate 325 (65 FE) MG tablet Take 325 mg by mouth daily with breakfast. (Patient not taking: Reported on 05/26/2023)     promethazine (PHENERGAN) 25 MG tablet Take 1 tablet (25 mg total) by mouth every 6 (six) hours as needed for nausea or vomiting. (Patient not taking: Reported on 05/26/2023) 30 tablet 2   No current facility-administered medications  on file prior to visit.    Review of Systems Pertinent items noted in HPI and remainder of comprehensive ROS otherwise negative. Physical Exam:   Vitals:   05/26/23 0856  BP: 127/73  Pulse: 95  Weight: 270 lb (122.5 kg)   Fetal Heart Rate (bpm): 161  Constitutional: Well-developed, well-nourished pregnant female in no acute distress.  HEENT: PERRLA Skin: normal color and turgor, no rash Cardiovascular: normal rate & rhythm, warm and well perfused Respiratory: normal effort, no problems with respiration noted GI: Abd soft, non-distended MS: Extremities nontender, no edema, normal ROM Neurologic: Alert and oriented x 4.  GU: no CVA tenderness Pelvic:Exam deferred  Assessment:    Pregnancy: [id number] Patient Active Problem List   Diagnosis Date Noted   Supervision of other normal pregnancy, antepartum 04/30/2023   Dizziness 03/31/2022   Other fatigue 03/31/2022   Chronic midline low back pain without sciatica 03/31/2022   Pelvic pain in female 03/31/2022   BMI 40.0-44.9,  adult (HCC) 12/31/2021     Plan:   1. Supervision of other normal pregnancy, antepartum (Primary) - Labs reviewed - PNV   2. BMI 40.0-44.9, adult (HCC) - Early HgbA1C 5.6  3. Short interval between pregnancies affecting pregnancy in second trimester, antepartum - 01/30/23 NSVD  4. Pelvic pressure in pregnancy, third trimester Pregnancy accomodation letter given &  I have approved standard pregnancy restrictions at this time   5. Sciatica in pregnancy  - Discussed PT vs massage therapy - Patient unsure at this time , declined referral to PT - Warm compresses  - Tylenol prn  - Initial labs drawn. (12/26)  - Continue prenatal vitamins.  - Problem list reviewed and updated.  - Genetic Screening discussed, Panorama results reviewed.  - Ultrasound discussed; fetal anatomic survey:  scheduled 06/26/23 .  - Anticipatory guidance about prenatal visits given including labs, ultrasounds, and testing. - Discussed usage of Babyscripts and virtual visits as additional source of managing and completing prenatal visits in midst of coronavirus and pandemic.    - Encouraged to complete MyChart Registration for her ability to review results, send requests, and have questions addressed.   - The nature of [place] [facility] for [facility] Healthcare/Faculty Practice with multiple MDs and Advanced Practice Providers was explained to patient; also emphasized that residents, students are part of our team.  - Routine obstetric precautions reviewed. Encouraged to seek out care at office or emergency room [facility] preferred) for urgent and/or emergent concerns.     Future Appointments  Date Time Provider Department Center  06/23/2023  9:35 AM Hurshel Party, CNM [facility] None  06/26/2023  1:15 PM [facility] NURSE [facility]  06/26/2023  1:30 PM [facility] US1 [facility]  08/11/2023 10:00 AM Karamalegos, Netta Neat, DO [facility]EC    Marcell Barlow, MSN, [facility],  Center for [employer]

## 2023-05-26 NOTE — Patient Instructions (Signed)
We highly recommend childbirth education to help you plan for labor and begin practicing coping skills (which will be needed with or without pain meds).  [place] Childbirth Education Options: Sign up by visiting [email or web address]  Childbirth ~ Self-Paced eClass (English and Spanish) This online class offers you the freedom to complete a childbirth education series in the comfort of your own home at your own pace.  Childbirth Class (In-Person 4-Week Series  or on Saturdays, Virtual 4-Week Series ~ [place]) This interactive in-person class series will help you and your partner prepare for your birth experience. Topics include: Labor & Birth, Comfort Measures, Breathing Techniques, Massage, Medical Interventions, Pain Management Options, Cesarean Birth, Postpartum Care, and Newborn Care  Comfort Techniques for Labor ~ In-Person Class ([place]) This interactive class is designed for parents-to-be who want to learn & practice hands-on skills to help relieve some of the discomfort of labor and encourage their babies to rotate toward the best position for birth. Moms and their partners will be able to try a variety of labor positions with birth balls and rebozos as well as practice breathing, relaxation, and visualization techniques.  Natural Childbirth Class (In-Person 5-Week Series, In-Person on Saturdays or Virtual 5-Week Series ~ [place]) This class series is designed for expectant parents who want to learn and practice natural methods of coping with the process of labor and childbirth.  Cesarean Birth Self-Paced eClass (English and Spanish) This online course provides comprehensive information you can trust as you prepare for a possible cesarean birth. In this class, you'll learn how to make your birth and recovery comfortable and joyful through instructive video clips, animations, and activities.  Waterbirth ~ Virtual Class Interested in a waterbirth? In addition to a consultation  with your credentialed waterbirth provider, this free, informational online class will help you discover whether waterbirth is the right fit for you. Not all obstetrical practices offer waterbirth, so check with your healthcare provider.  Tour (Self-Paced Video) - [facility] [place] Watch our 4 minute video tour of [facility] located in [place].   [place] Parenting Education Options:  Pregnancy 101 (Virtual) Congratulations on your pregnancy! This class is geared toward moms in their first trimester, but everyone is welcome. We are excited to guide you through all aspects of supporting a healthy pregnancy. You will learn what to expect at routine prenatal care appointments, common postpartum adjustments, basic infant safety, and breastfeeding.  Successful Partnering & Parenting ~ In-Person Workshop ([place]) This workshop inspires and equips partners of all economic levels, ages, and cultures to confidently care for their infants, support the birthing persons, and navigate their own transformations into new partners and parents. Learning activities are geared towards supporting partner, but moms are welcome to attend.  'Baby & Me' Parenting Group (Virtual on Wednesdays at 11am) Enjoy this time discussing newborn & infant parenting topics and family adjustment issues with other new parents in a relaxed environment. Each week brings a new speaker or baby-centered activity. This group offers support and connection to parents as they journey through the adjustments and struggles of that sometimes overwhelming first year after the birth of a child.  Baby Safety, CPR, & Choking Class ~ Virtual This life-saving information is meant to encourage parents as they learn important safety and prevention tips as well as infant CPR and relief of choking.  Breastfeeding Class (In-Person in [place] or Virtual) Families learn what to expect in the  first days and weeks of breastfeeding your   newborn. IF YOU ARE AN EMPLOYEE TAKING THIS CLASS FOR CREDIT, DO NOT register yourself. Please e-mail [email or web address].   Breastfeeding Self-Paced eClass (English & Spanish) Families learn what to expect in the first days and weeks of breastfeeding your newborn.  Caring for Baby ~ In-Person, Virtual or Self-Paced Class This in-person class is for both expectant and adoptive parents who want to learn and practice the most up-to-date newborn care for their babies. Focus is on birth through the first six weeks of life.  CPR & Choking Relief for Infants & Children ~ In-Person Class ([place]) This in-person course is designed for any parent, expectant parent, or adult who cares for infants or children. Participants learn and demonstrate cardiopulmonary resuscitation and choking relief procedures for both infants and children.  Grandparent Love ~ In-Person Class Grandparents will learn the most updated infant care and safety recommendations. They will discover ways to support their own children during the transition into the parenting role and receive tips on communicating with the new parents.  [place] Parenting Support Group Options:  Bereavement Grief Support Group (Pregnancy/Infant Loss) - Virtual This is an ongoing experience that meets once a month and is designed to help you honor the past, assist you in discovering tools to strengthen you today, and aid you in developing hope for the future.  Breastfeeding & Pumping Support Group (In-Person on Thursdays at 12pm or Virtual on Tuesdays at 5pm) Join us in-person each Thursday starting June 1st, 2023 at 12pm! This support group is free for all families looking for breastfeeding and/or pumping support.   Community-Based Childbirth Education Options:  [place] [facility] Classes:  Childbirth education classes can help you get ready for a positive parenting experience. You  can also meet other expectant parents and get free stuff for your baby. Each class runs for five weeks on the same night and costs $45 for the mother-to-be and her support person. Medicaid covers the cost if you are eligible. Call [phone] to register.  [facility] [place] The [employer] offers a variety of programs for the [place] community and is another great way to get connected. Please go to [email or web address] for more information.  Childbirth With A Twist! Be informed of your options, get educated on birth, understand what your body is doing, learn how to cope, and have a lot of fun and laughs all while doing it either from the comfort of your couch OR in our cozy office and classroom space near the [place] airport. If you are taking a virtual class, then class is taught LIVE, so you can ask questions and receive answers in real-time from an experienced doula and childbirth educator.  This virtual childbirth education class will meet for five instruction times online.  Although we are based in [place], [place], this virtual class is open to anyone in the world. Please visit: [email or web address] for more information.  Books We Love: The Doula Guide to Childbirth by Ananda Lowe and Rachel Zimmerman The First-Time Parent's Childbirth Handbook by Dr. Stephanie Mitchell, CNM The Birth Partner by Penny Simkin  

## 2023-06-22 DIAGNOSIS — O99210 Obesity complicating pregnancy, unspecified trimester: Secondary | ICD-10-CM | POA: Insufficient documentation

## 2023-06-23 ENCOUNTER — Encounter: Payer: Self-pay | Admitting: Advanced Practice Midwife

## 2023-06-23 ENCOUNTER — Ambulatory Visit: Payer: Medicaid Other | Admitting: Advanced Practice Midwife

## 2023-06-23 VITALS — BP 132/68 | HR 93 | Wt 269.0 lb

## 2023-06-23 DIAGNOSIS — O26892 Other specified pregnancy related conditions, second trimester: Secondary | ICD-10-CM

## 2023-06-23 DIAGNOSIS — L259 Unspecified contact dermatitis, unspecified cause: Secondary | ICD-10-CM

## 2023-06-23 DIAGNOSIS — Z3A18 18 weeks gestation of pregnancy: Secondary | ICD-10-CM | POA: Diagnosis not present

## 2023-06-23 DIAGNOSIS — Z3480 Encounter for supervision of other normal pregnancy, unspecified trimester: Secondary | ICD-10-CM | POA: Diagnosis not present

## 2023-06-23 DIAGNOSIS — M549 Dorsalgia, unspecified: Secondary | ICD-10-CM

## 2023-06-23 DIAGNOSIS — O99891 Other specified diseases and conditions complicating pregnancy: Secondary | ICD-10-CM | POA: Diagnosis not present

## 2023-06-23 DIAGNOSIS — R12 Heartburn: Secondary | ICD-10-CM

## 2023-06-23 MED ORDER — FAMOTIDINE 20 MG PO TABS
20.0000 mg | ORAL_TABLET | Freq: Two times a day (BID) | ORAL | 1 refills | Status: DC
Start: 2023-06-23 — End: 2023-09-15

## 2023-06-23 NOTE — Progress Notes (Signed)
 C/O itching with rash. Then goes away. Started last month. Had flu. Given Tamaflu from walkin clinic. Concerned about pregnancy so did not take it.

## 2023-06-23 NOTE — Progress Notes (Signed)
 PRENATAL VISIT NOTE  Subjective:  Jamie [name] is a 33 y.o. [id number] at [redacted]w[redacted]d being seen today for ongoing prenatal care.  She is currently monitored for the following issues for this low-risk pregnancy and has BMI 40.0-44.9, adult (HCC); Supervision of other normal pregnancy, antepartum; and Obesity affecting pregnancy on their problem list.  Patient reports backache, heartburn, and abdominal cramping .  Contractions: Not present. Vag. Bleeding: None.  Movement: Present. Denies leaking of fluid.   The following portions of the patient's history were reviewed and updated as appropriate: allergies, current medications, past family history, past medical history, past social history, past surgical history and problem list.   Objective:   Vitals:   06/23/23 0950  BP: 132/68  Pulse: 93  Weight: 122 kg    Fetal Status: Fetal Heart Rate (bpm): 156   Movement: Present     General:  Alert, oriented and cooperative. Patient is in no acute distress.  Skin: Skin is warm and dry. No rash noted.   Cardiovascular: Normal heart rate noted  Respiratory: Normal respiratory effort, no problems with respiration noted  Abdomen: Soft, gravid, appropriate for gestational age.  Pain/Pressure: Absent     Pelvic: Cervical exam deferred        Extremities: Normal range of motion.  Edema: None  Mental Status: Normal mood and affect. Normal behavior. Normal judgment and thought content.   Assessment and Plan:  Pregnancy: [id number] at [redacted]w[redacted]d 1. Supervision of other normal pregnancy, antepartum Anatomy scan on 2/21 - AFP, Serum, Open Spina Bifida --Anticipatory guidance about next visits/weeks of pregnancy given.   2. Pregnancy with 18 completed weeks gestation   3. Back pain affecting pregnancy in second trimester (Primary) -had back pain prior to pregnancy, previously saw a chiropractor which was helpful. No problems with back pain in last pregnancy but has been bothering her more in the past month.  Happens at nighttime & when waking up. Does use maternity belt. Discussed returning to chiropractor. Consider PT referral if need -Rest/ice/heat/warm bath/increase PO fluids/Tylenol/pregnancy support belt   4. Heartburn during pregnancy in second trimester -hx of heartburn but has been worse recently with mid abdominal pain & upper chest burning, some lower abdominal cramping as well. Will try pepcid BID, patient to follow up sooner if no improvement or worsening  5. Contact dermatitis, unspecified contact dermatitis type, unspecified trigger -rash with itching throughout the day over the past 1.5 months mainly on arms and mid back. Prior excoriations visible. Discussed switching back to sensitive detergent, Benadryl or Zyrtec prn, and Cerave itch relief lotion. Will reach out sooner if no improvement.   Preterm labor symptoms and general obstetric precautions including but not limited to vaginal bleeding, contractions, leaking of fluid and fetal movement were reviewed in detail with the patient. Please refer to After Visit Summary for other counseling recommendations.   No follow-ups on file.  Future Appointments  Date Time Provider Department Center  06/26/2023  1:15 PM [facility]URSE [facility]  06/26/2023  1:30 PM [facility] US1 [facility]  07/21/2023  9:55 AM [place], Wilmer Floor, CNM [facility] None  08/11/2023 10:00 AM Althea Charon Netta Neat, DO [facility]    [place], [facility] 06/23/23   CNM attestation:  I have seen and examined this patient; I agree with above documentation in the NP student's note.   Jamie [name] is a 33 y.o. [id number] in the [facility] office for routine prenatal visit for low risk pregnancy. See problem list below. +FM, denies LOF, VB, contractions,  vaginal discharge.  Patient Active Problem List   Diagnosis Date Noted   Obesity affecting pregnancy 06/22/2023   Supervision of other normal pregnancy, antepartum 04/30/2023   BMI 40.0-44.9, adult (HCC)  12/31/2021     ROS, labs, PMH reviewed  PE: BP 132/68   Pulse 93   Wt 269 lb (122 kg)   LMP 01/28/2023   BMI 38.60 kg/m  Gen: calm comfortable, well appearing Resp: normal effort, no distress Abd: gravid appropriate for gestational age  Fundal height: n/a FHT by doppler: 156  Plan: - fetal kick counts reinforced, preterm labor precautions -    1. Supervision of other normal pregnancy, antepartum (Primary)  - AFP, Serum, Open Spina Bifida   2. Pregnancy with 18 completed weeks gestation   3. Back pain affecting pregnancy in second trimester --Pt with pain related to work, lifting for [employer] job. Regular accommodations letter already sent, and pt has additional paperwork for more details.   --I am limiting patient to her regular work hours, and no overtime, and adding that she can have bathroom breaks as needed.   4. Heartburn during pregnancy in second trimester  - famotidine (PEPCID) 20 MG tablet; Take 1 tablet (20 mg total) by mouth 2 (two) times daily.  Dispense: 60 tablet; Refill: 1  5. Contact dermatitis, unspecified contact dermatitis type, unspecified trigger     Sharen Counter, CNM 5:31 PM

## 2023-06-23 NOTE — Progress Notes (Signed)
 C/O stomach and back pain. Rash all over body with itching. Had flu. Went to [employer] clinic. Given Tamaflu but did not take. Had episode brownish discharge. Wears back brace.

## 2023-06-25 LAB — AFP, SERUM, OPEN SPINA BIFIDA
AFP MoM: 1.47
AFP Value: 51.7 ng/mL
Gest. Age on Collection Date: 18.4 wk
Maternal Age At EDD: 34.3 a
OSBR Risk 1 IN: 5931
Test Results:: NEGATIVE
Weight: 270 [lb_av]

## 2023-06-26 ENCOUNTER — Ambulatory Visit: Payer: 59

## 2023-06-26 ENCOUNTER — Ambulatory Visit (HOSPITAL_BASED_OUTPATIENT_CLINIC_OR_DEPARTMENT_OTHER): Payer: Medicaid Other | Admitting: Maternal & Fetal Medicine

## 2023-06-26 ENCOUNTER — Ambulatory Visit: Payer: Self-pay

## 2023-06-26 ENCOUNTER — Ambulatory Visit: Payer: Medicaid Other | Attending: Obstetrics and Gynecology

## 2023-06-26 ENCOUNTER — Other Ambulatory Visit: Payer: Self-pay | Admitting: *Deleted

## 2023-06-26 VITALS — BP 110/67

## 2023-06-26 DIAGNOSIS — Z362 Encounter for other antenatal screening follow-up: Secondary | ICD-10-CM

## 2023-06-26 DIAGNOSIS — Z6841 Body Mass Index (BMI) 40.0 and over, adult: Secondary | ICD-10-CM

## 2023-06-26 DIAGNOSIS — O99212 Obesity complicating pregnancy, second trimester: Secondary | ICD-10-CM | POA: Insufficient documentation

## 2023-06-26 DIAGNOSIS — O99210 Obesity complicating pregnancy, unspecified trimester: Secondary | ICD-10-CM | POA: Diagnosis present

## 2023-06-26 DIAGNOSIS — Z3480 Encounter for supervision of other normal pregnancy, unspecified trimester: Secondary | ICD-10-CM

## 2023-06-26 DIAGNOSIS — O321XX0 Maternal care for breech presentation, not applicable or unspecified: Secondary | ICD-10-CM | POA: Insufficient documentation

## 2023-06-26 DIAGNOSIS — Z3A19 19 weeks gestation of pregnancy: Secondary | ICD-10-CM | POA: Diagnosis not present

## 2023-06-26 DIAGNOSIS — E669 Obesity, unspecified: Secondary | ICD-10-CM | POA: Diagnosis not present

## 2023-06-26 DIAGNOSIS — Z363 Encounter for antenatal screening for malformations: Secondary | ICD-10-CM | POA: Insufficient documentation

## 2023-06-26 NOTE — Progress Notes (Signed)
 Patient information  Patient Name: Jamie [name]  Patient MRN:   [id number]  Referring practice: MFM Referring Provider: [facility]  MFM CONSULT  Jamie [name] is a 33 y.o. [id number] at [redacted]w[redacted]d here for ultrasound and consultation. Patient Active Problem List   Diagnosis Date Noted   Obesity affecting pregnancy 06/22/2023   Supervision of other normal pregnancy, antepartum 04/30/2023   BMI 40.0-44.9, adult (HCC) 12/31/2021    Kelsee Styron is doing well today with no acute concerns.    RE elevated BMI: I discussed the potential complications associated with obesity in pregnancy.  These complications include but are not limited to increased risk of excessive maternal weight gain, fetal growth abnormalities, fetal congenital disorders, inability to visualize fetal anatomic structures on ultrasound, gestational diabetes, hypertensive disorders of pregnancy, operative birth including cesarean delivery or assisted vaginal delivery, delayed wound healing and many long-term health complications.  I discussed the need for continued growth ultrasounds and possibly antenatal testing depending upon how the pregnancy course progresses.  Maternal weight gain should be limited to 10 to 20 pounds during the pregnancy.  While normal weight loss may occur during the first and early second trimester, efforts to actively lose weight with the use of medication is not recommended during pregnancy.  A whole food diet and regular exercise of at least 15 to 30 minutes of moderately strenuous activity is recommended in the absence of any contraindications. Weight loss with the use of medications is not recommended during pregnancy.  If other existing comorbidities are present then 81 mg of aspirin should be considered for preeclampsia risk reduction.   Sonographic findings Single intrauterine pregnancy at 19w 0d  Fetal cardiac activity:  Observed and appears normal. Presentation: Breech. The anatomic structures  that were well seen appear normal without evidence of soft markers. Due to poor acoustic windows some structures remain suboptimally visualized. Fetal biometry shows the estimated fetal weight at the 65 percentile.  Amniotic fluid: Within normal limits.  MVP: 5.63 cm. Placenta: Anterior. Adnexa: No abnormality visualized. Cervical length: 3.2 cm.  There are limitations of prenatal ultrasound such as the inability to detect certain abnormalities due to poor visualization. Various factors such as fetal position, gestational age and maternal body habitus may increase the difficulty in visualizing the fetal anatomy.    Recommendations - EDD should be 11/20/2023 based on  U/S C R L  (04/30/23). - Follow up ultrasound in 4-6 weeks to attempt visualization of the anatomy not seen and reassess the fetal growth -Baseline preeclampsia labs: CMP, CBC and urine protein/creatinine ratio if not previously completed.  -Early glucose screening due to multiple risk factors. -Aspirin 81 mg for preeclampsia prophylaxis -Limit weight gain to 20 lbs or less  -Serial growth ultrasounds starting around 28 weeks to monitor for fetal growth restriction -Antenatal testing to start around 34 weeks due to the increased risk of stillbirth and high risk pregnancy -Delivery timing pending clinical course -Continue routine prenatal care with referring OB provider  Review of Systems: A review of systems was performed and was negative except per HPI   Vitals and Physical Exam    06/26/2023    1:17 PM 06/23/2023    9:50 AM 05/26/2023    8:56 AM  Vitals with BMI  Weight  269 lbs 270 lbs  Systolic 110 132 147  Diastolic 67 68 73  Pulse  93 95    Sitting comfortably on the sonogram table Nonlabored breathing Normal rate and rhythm Abdomen is nontender  Past pregnancies OB History  Gravida Para Term Preterm AB Living  5 3 3  0 1 3  SAB IAB Ectopic Multiple Live Births  1 0 0 0 3    # Outcome Date GA Lbr Len/2nd  Weight Sex Type Anes PTL Lv  5 Current           4 Term 01/29/22 [redacted]w[redacted]d 03:09 / 00:03 7 lb 4.4 oz (3.3 kg) F Vag-Spont None  LIV  3 SAB 12/04/20 [redacted]w[redacted]d         2 Term 01/05/17 [redacted]w[redacted]d 06:25 / 00:03 8 lb 13.8 oz (4.02 kg) M Vag-Spont Local  LIV  1 Term 05/22/13 [redacted]w[redacted]d 09:35 / 00:18 7 lb 4.8 oz (3.311 kg) M Vag-Spont None  LIV     I spent 20 minutes reviewing the patients chart, including labs and images as well as counseling the patient about her medical conditions. Greater than 50% of the time was spent in direct face-to-face patient counseling.  Braxton Feathers  MFM, [facility]   06/26/2023  3:47 PM

## 2023-07-17 ENCOUNTER — Encounter (HOSPITAL_COMMUNITY): Payer: Self-pay | Admitting: Obstetrics and Gynecology

## 2023-07-17 ENCOUNTER — Inpatient Hospital Stay (HOSPITAL_COMMUNITY)
Admission: AD | Admit: 2023-07-17 | Discharge: 2023-07-17 | Disposition: A | Discharge status: Home / Self Care | Attending: Obstetrics and Gynecology | Admitting: Obstetrics and Gynecology

## 2023-07-17 DIAGNOSIS — O218 Other vomiting complicating pregnancy: Secondary | ICD-10-CM | POA: Insufficient documentation

## 2023-07-17 DIAGNOSIS — A084 Viral intestinal infection, unspecified: Secondary | ICD-10-CM

## 2023-07-17 DIAGNOSIS — O26892 Other specified pregnancy related conditions, second trimester: Secondary | ICD-10-CM

## 2023-07-17 DIAGNOSIS — Z3A22 22 weeks gestation of pregnancy: Secondary | ICD-10-CM | POA: Diagnosis not present

## 2023-07-17 DIAGNOSIS — R112 Nausea with vomiting, unspecified: Secondary | ICD-10-CM | POA: Diagnosis present

## 2023-07-17 DIAGNOSIS — Z3689 Encounter for other specified antenatal screening: Secondary | ICD-10-CM

## 2023-07-17 LAB — COMPREHENSIVE METABOLIC PANEL
ALT: 13 U/L (ref 0–44)
AST: 17 U/L (ref 15–41)
Albumin: 3 g/dL — ABNORMAL LOW (ref 3.5–5.0)
Alkaline Phosphatase: 54 U/L (ref 38–126)
Anion gap: 10 (ref 5–15)
BUN: 5 mg/dL — ABNORMAL LOW (ref 6–20)
CO2: 21 mmol/L — ABNORMAL LOW (ref 22–32)
Calcium: 8.4 mg/dL — ABNORMAL LOW (ref 8.9–10.3)
Chloride: 102 mmol/L (ref 98–111)
Creatinine, Ser: 0.64 mg/dL (ref 0.44–1.00)
GFR, Estimated: >60 mL/min (ref >60)
Glucose, Bld: 85 mg/dL (ref 70–99)
Potassium: 3.5 mmol/L (ref 3.5–5.1)
Sodium: 133 mmol/L — ABNORMAL LOW (ref 135–145)
Total Bilirubin: 0.6 mg/dL (ref 0.0–1.2)
Total Protein: 6.5 g/dL (ref 6.5–8.1)

## 2023-07-17 LAB — URINALYSIS, ROUTINE W REFLEX MICROSCOPIC
Bilirubin Urine: NEGATIVE
Glucose, UA: NEGATIVE mg/dL
Hgb urine dipstick: NEGATIVE
Ketones, ur: 20 mg/dL — AB
Nitrite: NEGATIVE
Protein, ur: 30 mg/dL — AB
Specific Gravity, Urine: 1.024 (ref 1.005–1.030)
pH: 5 (ref 5.0–8.0)

## 2023-07-17 LAB — CBC WITH DIFFERENTIAL/PLATELET
Abs Immature Granulocytes: 0.05 10*3/uL (ref 0.00–0.07)
Basophils Absolute: 0 10*3/uL (ref 0.0–0.1)
Basophils Relative: 0 %
Eosinophils Absolute: 0 10*3/uL (ref 0.0–0.5)
Eosinophils Relative: 0 %
HCT: 32.2 % — ABNORMAL LOW (ref 36.0–46.0)
Hemoglobin: 10.9 g/dL — ABNORMAL LOW (ref 12.0–15.0)
Immature Granulocytes: 1 %
Lymphocytes Relative: 15 %
Lymphs Abs: 1 10*3/uL (ref 0.7–4.0)
MCH: 31.1 pg (ref 26.0–34.0)
MCHC: 33.9 g/dL (ref 30.0–36.0)
MCV: 91.7 fL (ref 80.0–100.0)
Monocytes Absolute: 0.4 10*3/uL (ref 0.1–1.0)
Monocytes Relative: 6 %
Neutro Abs: 5 10*3/uL (ref 1.7–7.7)
Neutrophils Relative %: 78 %
Platelets: 246 10*3/uL (ref 150–400)
RBC: 3.51 MIL/uL — ABNORMAL LOW (ref 3.87–5.11)
RDW: 13.9 % (ref 11.5–15.5)
WBC: 6.5 10*3/uL (ref 4.0–10.5)
nRBC: 0 % (ref 0.0–0.2)

## 2023-07-17 LAB — AMYLASE: Amylase: 49 U/L (ref 28–100)

## 2023-07-17 LAB — LIPASE, BLOOD: Lipase: 19 U/L (ref 11–51)

## 2023-07-17 MED ORDER — ONDANSETRON HCL 4 MG/2ML IJ SOLN
4.0000 mg | Freq: Once | INTRAMUSCULAR | Status: AC
  Administered 2023-07-17: 4 mg via INTRAVENOUS
  Filled 2023-07-17: qty 2

## 2023-07-17 MED ORDER — FAMOTIDINE IN NACL 20-0.9 MG/50ML-% IV SOLN
20.0000 mg | Freq: Once | INTRAVENOUS | Status: AC
  Administered 2023-07-17: 20 mg via INTRAVENOUS
  Filled 2023-07-17: qty 50

## 2023-07-17 MED ORDER — ONDANSETRON 4 MG PO TBDP: 4.0000 mg | ORAL_TABLET | Freq: Three times a day (TID) | ORAL | 0 refills | Status: DC | PRN

## 2023-07-17 MED ORDER — LACTATED RINGERS IV BOLUS
1000.0000 mL | Freq: Once | INTRAVENOUS | Status: AC
  Administered 2023-07-17: 1000 mL via INTRAVENOUS

## 2023-07-17 NOTE — MAU Note (Signed)
.  Jamie [name] is a 33 y.o. at [redacted]w[redacted]d here in MAU reporting: having N/V/D  since 3:30 this morning.  Not able to keep anything down since last night. Stated now she is having bad back and rib pain "everything hurts" Had not felt baby move much but reports baby is moving more now since she has been in the lobby. Reports her son hand n/v no diarrhea 2 days ago.  LMP:  Onset of complaint: 330am Pain score: 8  There were no vitals filed for this visit.   FHT: 169  Lab orders placed from triage: u/a

## 2023-07-17 NOTE — MAU Provider Note (Signed)
 Chief Complaint:  Diarrhea and Emesis  HPI   Event Date/Time   First Provider Initiated Contact with Patient 07/17/23 1537     Jamie [name] is a 33 y.o. [id number] at [redacted]w[redacted]d who presents to maternity admissions reporting nausea/vomiting since 0330, her 10yo son has had n/v/d since yesterday. Also having body aches and wasn't feeling the baby move until she got to MAU. No other physical complaints.  Pregnancy Course: Receives care at [facility], prenatal records reviewed.  Past Medical History:  Diagnosis Date   Asthma    OB History  Gravida Para Term Preterm AB Living  5 3 3  0 1 3  SAB IAB Ectopic Multiple Live Births  1 0 0 0 3    # Outcome Date GA Lbr Len/2nd Weight Sex Type Anes PTL Lv  5 Current           4 Term 01/29/22 [redacted]w[redacted]d 03:09 / 00:03 7 lb 4.4 oz (3.3 kg) F Vag-Spont None  LIV  3 SAB 12/04/20 [redacted]w[redacted]d         2 Term 01/05/17 [redacted]w[redacted]d 06:25 / 00:03 8 lb 13.8 oz (4.02 kg) M Vag-Spont Local  LIV  1 Term 05/22/13 [redacted]w[redacted]d 09:35 / 00:18 7 lb 4.8 oz (3.311 kg) M Vag-Spont None  LIV   Past Surgical History:  Procedure Laterality Date   NO PAST SURGERIES     Family History  Problem Relation Age of Onset   Heart murmur Mother    Hypertension Mother    Diabetes Father    Stroke Father    Heart disease Father    Heart failure Father    Arrhythmia Brother    Healthy Brother    Diabetes Maternal Grandfather    Heart disease Maternal Grandfather    Hypertension Maternal Grandfather    Heart attack Paternal Grandmother    Social History   Tobacco Use   Smoking status: Never   Smokeless tobacco: Never  Vaping Use   Vaping status: Never Used  Substance Use Topics   Alcohol use: Not Currently    Comment: socially, before pregnancy   Drug use: No   Allergies  Allergen Reactions   Shellfish Allergy Anaphylaxis and Swelling    Throat swelling   Lactose Diarrhea   Prednisone Other (See Comments)    "flutters"  bradycardia    Medications Prior to Admission  Medication Sig  Dispense Refill Last Dose/Taking   aspirin EC 81 MG tablet Take 1 tablet (81 mg total) by mouth daily. 60 tablet 2 07/16/2023 Evening   Prenatal MV & Min w/FA-DHA (PRENATAL GUMMIES PO) Take by mouth.   07/16/2023 Evening   acetaminophen (TYLENOL) 325 MG tablet Take 3 tablets (975 mg total) by mouth every 8 (eight) hours as needed for mild pain (pain score 1-3). (Patient not taking: Reported on 06/26/2023) 270 tablet 0    doxycycline (VIBRAMYCIN) 100 MG capsule Take 1 capsule (100 mg total) by mouth 2 (two) times daily. (Patient not taking: Reported on 06/26/2023) 20 capsule 0    famotidine (PEPCID) 20 MG tablet Take 1 tablet (20 mg total) by mouth 2 (two) times daily. (Patient not taking: Reported on 07/17/2023) 60 tablet 1 Not Taking   ferrous sulfate 325 (65 FE) MG tablet Take 325 mg by mouth daily with breakfast. (Patient not taking: Reported on 05/26/2023)      promethazine (PHENERGAN) 25 MG tablet Take 1 tablet (25 mg total) by mouth every 6 (six) hours as needed for nausea or vomiting. (Patient not taking:  Reported on 06/26/2023) 30 tablet 2    I have reviewed patient's Past Medical Hx, Surgical Hx, Family Hx, Social Hx, medications and allergies.   ROS  Pertinent items noted in HPI and remainder of comprehensive ROS otherwise negative.   PHYSICAL EXAM  Patient Vitals for the past 24 hrs:  BP Temp Pulse Resp Height Weight  07/17/23 1529 (!) 101/52 -- 94 -- -- --  07/17/23 1458 111/68 99.1 F (37.3 C) 98 18 5\' 10"  (1.778 m) 269 lb (122 kg)   Constitutional: Well-developed, well-nourished female in mild physical distress.  Cardiovascular: normal rate & rhythm, warm and well-perfused Respiratory: normal effort, no problems with respiration noted GI: Abd soft, non-tender, non-distended MS: Extremities nontender, no edema, normal ROM Neurologic: Alert and oriented x 4.  GU: no CVA tenderness Pelvic: exam deferred  FHR: 169:    Labs: Results for orders placed or performed during the  hospital encounter of 07/17/23 (from the past 24 hours)  Urinalysis, Routine w reflex microscopic -Urine, Clean Catch     Status: Abnormal   Collection Time: 07/17/23  3:33 PM  Result Value Ref Range   Color, Urine YELLOW YELLOW   APPearance HAZY (A) CLEAR   Specific Gravity, Urine 1.024 1.005 - 1.030   pH 5.0 5.0 - 8.0   Glucose, UA NEGATIVE NEGATIVE mg/dL   Hgb urine dipstick NEGATIVE NEGATIVE   Bilirubin Urine NEGATIVE NEGATIVE   Ketones, ur 20 (A) NEGATIVE mg/dL   Protein, ur 30 (A) NEGATIVE mg/dL   Nitrite NEGATIVE NEGATIVE   Leukocytes,Ua SMALL (A) NEGATIVE   RBC / HPF 0-5 0 - 5 RBC/hpf   WBC, UA 11-20 0 - 5 WBC/hpf   Bacteria, UA FEW (A) NONE SEEN   Squamous Epithelial / HPF 6-10 0 - 5 /HPF   Mucus PRESENT   CBC with Differential/Platelet     Status: Abnormal   Collection Time: 07/17/23  4:28 PM  Result Value Ref Range   WBC 6.5 4.0 - 10.5 K/uL   RBC 3.51 (L) 3.87 - 5.11 MIL/uL   Hemoglobin 10.9 (L) 12.0 - 15.0 g/dL   HCT 16.1 (L) 09.6 - 04.5 %   MCV 91.7 80.0 - 100.0 fL   MCH 31.1 26.0 - 34.0 pg   MCHC 33.9 30.0 - 36.0 g/dL   RDW 40.9 81.1 - 91.4 %   Platelets 246 150 - 400 K/uL   nRBC 0.0 0.0 - 0.2 %   Neutrophils Relative % 78 %   Neutro Abs 5.0 1.7 - 7.7 K/uL   Lymphocytes Relative 15 %   Lymphs Abs 1.0 0.7 - 4.0 K/uL   Monocytes Relative 6 %   Monocytes Absolute 0.4 0.1 - 1.0 K/uL   Eosinophils Relative 0 %   Eosinophils Absolute 0.0 0.0 - 0.5 K/uL   Basophils Relative 0 %   Basophils Absolute 0.0 0.0 - 0.1 K/uL   Immature Granulocytes 1 %   Abs Immature Granulocytes 0.05 0.00 - 0.07 K/uL  Comprehensive metabolic panel     Status: Abnormal   Collection Time: 07/17/23  4:28 PM  Result Value Ref Range   Sodium 133 (L) 135 - 145 mmol/L   Potassium 3.5 3.5 - 5.1 mmol/L   Chloride 102 98 - 111 mmol/L   CO2 21 (L) 22 - 32 mmol/L   Glucose, Bld 85 70 - 99 mg/dL   BUN 5 (L) 6 - 20 mg/dL   Creatinine, Ser 7.82 0.44 - 1.00 mg/dL   Calcium 8.4 (L) 8.9 -  10.3  mg/dL   Total Protein 6.5 6.5 - 8.1 g/dL   Albumin 3.0 (L) 3.5 - 5.0 g/dL   AST 17 15 - 41 U/L   ALT 13 0 - 44 U/L   Alkaline Phosphatase 54 38 - 126 U/L   Total Bilirubin 0.6 0.0 - 1.2 mg/dL   GFR, Estimated >74 >25 mL/min   Anion gap 10 5 - 15  Amylase     Status: None   Collection Time: 07/17/23  4:28 PM  Result Value Ref Range   Amylase 49 28 - 100 U/L  Lipase, blood     Status: None   Collection Time: 07/17/23  4:28 PM  Result Value Ref Range   Lipase 19 11 - 51 U/L   Imaging:  No results found.  MDM & MAU COURSE  MDM: Moderate  MAU Course: Orders Placed This Encounter  Procedures   Urinalysis, Routine w reflex microscopic -Urine, Clean Catch   CBC with Differential/Platelet   Comprehensive metabolic panel   Amylase   Lipase, blood   Discharge patient   Meds ordered this encounter  Medications   lactated ringers bolus 1,000 mL   ondansetron (ZOFRAN) injection 4 mg   famotidine (PEPCID) IVPB 20 mg premix   ondansetron (ZOFRAN-ODT) 4 MG disintegrating tablet    Sig: Take 1 tablet (4 mg total) by mouth every 8 (eight) hours as needed for nausea or vomiting.    Dispense:  15 tablet    Refill:  0   Nausea meds given with good relief of nausea, body aches better after fluids in. No episodes of emesis while in MAU, po test successful. Advised bland return to eating as tolerated.  ASSESSMENT   1. Viral gastroenteritis   2. NST (non-stress test) reactive   3. [redacted] weeks gestation of pregnancy    PLAN  Discharge home in stable condition with return precautions.     Follow-up Information     [facility] Follow up.   Why: as scheduled for ongoing prenatal care Contact information: [address] [place] [address] [phone]                Allergies as of 07/17/2023       Reactions   Shellfish Allergy Anaphylaxis, Swelling   Throat swelling   Lactose Diarrhea   Prednisone Other (See Comments)    "flutters"  bradycardia        Medication List     STOP taking these medications    doxycycline 100 MG capsule Commonly known as: VIBRAMYCIN       TAKE these medications    acetaminophen 325 MG tablet Commonly known as: Tylenol Take 3 tablets (975 mg total) by mouth every 8 (eight) hours as needed for mild pain (pain score 1-3).   aspirin EC 81 MG tablet Take 1 tablet (81 mg total) by mouth daily.   famotidine 20 MG tablet Commonly known as: Pepcid Take 1 tablet (20 mg total) by mouth 2 (two) times daily.   ferrous sulfate 325 (65 FE) MG tablet Take 325 mg by mouth daily with breakfast.   ondansetron 4 MG disintegrating tablet Commonly known as: ZOFRAN-ODT Take 1 tablet (4 mg total) by mouth every 8 (eight) hours as needed for nausea or vomiting.   PRENATAL GUMMIES PO Take by mouth.   promethazine 25 MG tablet Commonly known as: PHENERGAN Take 1 tablet (25 mg total) by mouth every 6 (six) hours as needed for nausea  or vomiting.       Edd Arbour, CNM, MSN, IBCLC Certified Nurse Midwife, [facility]

## 2023-07-21 ENCOUNTER — Encounter: Payer: Self-pay | Admitting: Advanced Practice Midwife

## 2023-07-21 ENCOUNTER — Ambulatory Visit (INDEPENDENT_AMBULATORY_CARE_PROVIDER_SITE_OTHER): Payer: Medicaid Other | Admitting: Advanced Practice Midwife

## 2023-07-21 VITALS — BP 113/70 | HR 84 | Wt 270.8 lb

## 2023-07-21 DIAGNOSIS — Z3A22 22 weeks gestation of pregnancy: Secondary | ICD-10-CM | POA: Diagnosis not present

## 2023-07-21 DIAGNOSIS — Z3480 Encounter for supervision of other normal pregnancy, unspecified trimester: Secondary | ICD-10-CM | POA: Diagnosis not present

## 2023-07-21 DIAGNOSIS — R21 Rash and other nonspecific skin eruption: Secondary | ICD-10-CM

## 2023-07-21 DIAGNOSIS — L259 Unspecified contact dermatitis, unspecified cause: Secondary | ICD-10-CM | POA: Diagnosis not present

## 2023-07-21 NOTE — Progress Notes (Signed)
   PRENATAL VISIT NOTE  Subjective:  Jamie [name] is a 33 y.o. G5P3013 at [redacted]w[redacted]d being seen today for ongoing prenatal care.  She is currently monitored for the following issues for this low-risk pregnancy and has BMI 40.0-44.9, adult (HCC); Supervision of other normal pregnancy, antepartum; and Obesity affecting pregnancy on their problem list.  Patient reports  intermittent rash, generalized, worse on left hand today .  Contractions: Not present. Vag. Bleeding: None.  Movement: Present. Denies leaking of fluid.   The following portions of the patient's history were reviewed and updated as appropriate: allergies, current medications, past family history, past medical history, past social history, past surgical history and problem list.   Objective:   Vitals:   07/21/23 1001  BP: 113/70  Pulse: 84  Weight: 270 lb 12.8 oz (122.8 kg)    Fetal Status: Fetal Heart Rate (bpm): 154 Fundal Height: 24 cm Movement: Present     General:  Alert, oriented and cooperative. Patient is in no acute distress.  Skin: Skin is warm and dry. No rash noted.   Cardiovascular: Normal heart rate noted  Respiratory: Normal respiratory effort, no problems with respiration noted  Abdomen: Soft, gravid, appropriate for gestational age.  Pain/Pressure: Present     Pelvic: Cervical exam deferred        Extremities: Normal range of motion.  Edema: None  Mental Status: Normal mood and affect. Normal behavior. Normal judgment and thought content.   Assessment and Plan:  Pregnancy: G5P3013 at 104w4d 1. Supervision of other normal pregnancy, antepartum (Primary) --Anticipatory guidance about next visits/weeks of pregnancy given.   2. [redacted] weeks gestation of pregnancy   3. Rash --Generalized, pt using sensitive skin detergents and soaps. Pt with food allergies, consider elimination diet.    --OTC hydrocortisone cream TID PRN, f/u if worsening - Ambulatory referral to Dermatology  4. Contact dermatitis and  eczema  - Ambulatory referral to Dermatology   Preterm labor symptoms and general obstetric precautions including but not limited to vaginal bleeding, contractions, leaking of fluid and fetal movement were reviewed in detail with the patient. Please refer to After Visit Summary for other counseling recommendations.   No follow-ups on file.  Future Appointments  Date Time Provider Department Center  08/04/2023  3:30 PM [facility]  08/11/2023 10:00 AM Smitty Cords, DO [facility]  08/18/2023  9:55 AM [place], Wilmer Floor, CNM [facility] None    Sharen Counter, CNM

## 2023-07-21 NOTE — Progress Notes (Signed)
 Pt presents for ROB visit. Pt c/o itchy rash all over.

## 2023-08-04 ENCOUNTER — Other Ambulatory Visit: Payer: Self-pay | Admitting: *Deleted

## 2023-08-04 ENCOUNTER — Ambulatory Visit (HOSPITAL_BASED_OUTPATIENT_CLINIC_OR_DEPARTMENT_OTHER): Admitting: Maternal & Fetal Medicine

## 2023-08-04 ENCOUNTER — Ambulatory Visit: Payer: Medicaid Other | Attending: Maternal & Fetal Medicine

## 2023-08-04 DIAGNOSIS — Z6841 Body Mass Index (BMI) 40.0 and over, adult: Secondary | ICD-10-CM | POA: Diagnosis present

## 2023-08-04 DIAGNOSIS — Z3A24 24 weeks gestation of pregnancy: Secondary | ICD-10-CM | POA: Diagnosis present

## 2023-08-04 DIAGNOSIS — E669 Obesity, unspecified: Secondary | ICD-10-CM | POA: Diagnosis not present

## 2023-08-04 DIAGNOSIS — Z362 Encounter for other antenatal screening follow-up: Secondary | ICD-10-CM | POA: Diagnosis present

## 2023-08-04 DIAGNOSIS — O99212 Obesity complicating pregnancy, second trimester: Secondary | ICD-10-CM

## 2023-08-04 DIAGNOSIS — O99210 Obesity complicating pregnancy, unspecified trimester: Secondary | ICD-10-CM

## 2023-08-04 NOTE — Progress Notes (Signed)
   Patient information  Patient Name: Jamie [name]  Patient MRN:   [id number]  Referring practice: MFM Referring Provider: [place] [facility]  MFM CONSULT  MAELLE SHEAFFER is a 33 y.o. [id number] at [redacted]w[redacted]d here for ultrasound and consultation. Patient Active Problem List   Diagnosis Date Noted   Obesity affecting pregnancy 06/22/2023   Supervision of other normal pregnancy, antepartum 04/30/2023   BMI 40.0-44.9, adult (HCC) 12/31/2021    Jamie [name] has a pregnancy with the complications mentioned in the problem list. During today's visit we focused on the following concerns:   I discussed the ultrasound findings with the patient.  Currently the estimated fetal weight is in the 83rd percentile.  I encouraged the patient to continue proper diet and exercise for pregnancy and limit weight gain to approximately 10 to 20 pounds by the end of pregnancy.  Due to elevated BMI I discussed the need for antenatal testing to start around 34 weeks until the end of her pregnancy.   Sonographic findings Single intrauterine pregnancy at 24w 4d.  Fetal cardiac activity:  Observed and appears normal. Presentation: Cephalic. Interval fetal anatomy appears normal. Fetal biometry shows the estimated fetal weight at the 83 percentile. Amniotic fluid volume: Within normal limits. MVP: 7.44 cm. Placenta: Anterior.  Recommendations 1. Serial growth ultrasounds every 4-6 weeks until delivery 2. Antenatal testing to start around 34 weeks    Review of Systems: A review of systems was performed and was negative except per HPI   Vitals and Physical Exam    08/04/2023    3:08 PM 07/21/2023   10:01 AM 07/17/2023    6:18 PM  Vitals with BMI  Weight  270 lbs 13 oz   BMI  38.86   Systolic 114 113 130  Diastolic 58 70 70  Pulse 85 84 92    Sitting comfortably on the sonogram table Nonlabored breathing Normal rate and rhythm Abdomen is nontender  Past pregnancies OB History  Gravida Para Term Preterm AB  Living  5 3 3  0 1 3  SAB IAB Ectopic Multiple Live Births  1 0 0 0 3    # Outcome Date GA Lbr Len/2nd Weight Sex Type Anes PTL Lv  5 Current           4 Term 01/29/22 [redacted]w[redacted]d 03:09 / 00:03 7 lb 4.4 oz (3.3 kg) F Vag-Spont None  LIV  3 SAB 12/04/20 [redacted]w[redacted]d         2 Term 01/05/17 [redacted]w[redacted]d 06:25 / 00:03 8 lb 13.8 oz (4.02 kg) M Vag-Spont Local  LIV  1 Term 05/22/13 [redacted]w[redacted]d 09:35 / 00:18 7 lb 4.8 oz (3.311 kg) M Vag-Spont None  LIV     I spent 10 minutes reviewing the patients chart, including labs and images as well as counseling the patient about her medical conditions. Greater than 50% of the time was spent in direct face-to-face patient counseling.  Braxton Feathers, DO Maternal fetal medicine, [place]   08/04/2023  4:20 PM

## 2023-08-11 ENCOUNTER — Ambulatory Visit: Payer: Self-pay | Admitting: Family Medicine

## 2023-08-15 ENCOUNTER — Inpatient Hospital Stay (HOSPITAL_COMMUNITY)
Admission: AD | Admit: 2023-08-15 | Discharge: 2023-08-15 | Disposition: A | Discharge status: Home / Self Care | Attending: Obstetrics & Gynecology | Admitting: Obstetrics & Gynecology

## 2023-08-15 ENCOUNTER — Encounter (HOSPITAL_COMMUNITY): Payer: Self-pay | Admitting: Obstetrics & Gynecology

## 2023-08-15 DIAGNOSIS — Z3A26 26 weeks gestation of pregnancy: Secondary | ICD-10-CM | POA: Insufficient documentation

## 2023-08-15 DIAGNOSIS — R2242 Localized swelling, mass and lump, left lower limb: Secondary | ICD-10-CM | POA: Diagnosis not present

## 2023-08-15 DIAGNOSIS — O26892 Other specified pregnancy related conditions, second trimester: Secondary | ICD-10-CM | POA: Insufficient documentation

## 2023-08-15 DIAGNOSIS — Z888 Allergy status to other drugs, medicaments and biological substances status: Secondary | ICD-10-CM | POA: Insufficient documentation

## 2023-08-15 DIAGNOSIS — W57XXXA Bitten or stung by nonvenomous insect and other nonvenomous arthropods, initial encounter: Secondary | ICD-10-CM | POA: Diagnosis not present

## 2023-08-15 DIAGNOSIS — S70361A Insect bite (nonvenomous), right thigh, initial encounter: Secondary | ICD-10-CM | POA: Diagnosis not present

## 2023-08-15 DIAGNOSIS — R1013 Epigastric pain: Secondary | ICD-10-CM | POA: Insufficient documentation

## 2023-08-15 DIAGNOSIS — R21 Rash and other nonspecific skin eruption: Secondary | ICD-10-CM | POA: Insufficient documentation

## 2023-08-15 DIAGNOSIS — O9A212 Injury, poisoning and certain other consequences of external causes complicating pregnancy, second trimester: Secondary | ICD-10-CM | POA: Diagnosis not present

## 2023-08-15 DIAGNOSIS — R079 Chest pain, unspecified: Secondary | ICD-10-CM | POA: Insufficient documentation

## 2023-08-15 LAB — URINALYSIS, ROUTINE W REFLEX MICROSCOPIC
Bilirubin Urine: NEGATIVE
Glucose, UA: NEGATIVE mg/dL
Hgb urine dipstick: NEGATIVE
Ketones, ur: NEGATIVE mg/dL
Nitrite: NEGATIVE
Protein, ur: NEGATIVE mg/dL
Specific Gravity, Urine: 1.019 (ref 1.005–1.030)
pH: 6 (ref 5.0–8.0)

## 2023-08-15 MED ORDER — CEPHALEXIN 500 MG PO CAPS: 500.0000 mg | ORAL_CAPSULE | Freq: Four times a day (QID) | ORAL | 0 refills | Status: AC

## 2023-08-15 NOTE — MAU Note (Signed)
.  Jamie [name] is a 33 y.o. at [redacted]w[redacted]d here in MAU reporting: Hard raised lump on her right leg. She reports the area began to itch and she reports she then scratched the area and felt the hard lump on her leg. She reports the area was red and hard to touch. She reports the area also burned. She reports she has applied Benadryl cream twice with minimal relief. Unsure if she was bit by a bug. She reports the area is warm to touch. Not painful. She reports today she has felt increasingly winded with activity. Reports epigastric pain with fetal movement. Denies VB or LOF. +FM.   Works at the [employer] around a lot of boxed.   Onset of complaint: Yesterday Pain score: Denies pain.  Vitals:   08/15/23 1333  BP: 117/70  Pulse: 98  Resp: 18  Temp: 98.4 F (36.9 C)  SpO2: 100%      FHT: 144 initial external Lab orders placed from triage: ED EKG

## 2023-08-15 NOTE — MAU Provider Note (Signed)
 History     CSN: [id number]  Arrival date and time: 08/15/23 1310   Event Date/Time   First Provider Initiated Contact with Patient 08/15/23 1710      Chief Complaint  Patient presents with   Lump on leg   Possble bug bite   HPI [name] is a 33 y.o. year old G5P3013 female at [redacted]w[redacted]d weeks gestation who presents to MAU reporting yesterday she felt like she was bitten on her leg while she was in [place] . She noticed a raised lump on her LT leg. The lump was itchy so she scratched it. At that time she noticed the lump was hard when she scratched it. She reports the lump was red and hot to the touch. She reported a burning sensation. She applied Benadryl cream topically twice with minimal relief. She isn't sure if it was a bug bite or a spider bite; she did not see any puncture wounds. She reports increased being winded with activity. She reports epigastric pain with FM. She denies VB or LOF. She reports (+) FM. She receives [facility] with [facility]; next appt is 08/18/2023.   OB History     Gravida  5   Para  3   Term  3   Preterm  0   AB  1   Living  3      SAB  1   IAB  0   Ectopic  0   Multiple  0   Live Births  3           Past Medical History:  Diagnosis Date   Asthma     Past Surgical History:  Procedure Laterality Date   NO PAST SURGERIES      Family History  Problem Relation Age of Onset   Heart murmur Mother    Hypertension Mother    Diabetes Father    Stroke Father    Heart disease Father    Heart failure Father    Arrhythmia Brother    Healthy Brother    Diabetes Maternal Grandfather    Heart disease Maternal Grandfather    Hypertension Maternal Grandfather    Heart attack Paternal Grandmother     Social History   Tobacco Use   Smoking status: Never   Smokeless tobacco: Never  Vaping Use   Vaping status: Never Used  Substance Use Topics   Alcohol use: Not Currently    Comment: socially, before pregnancy   Drug use: No     Allergies:  Allergies  Allergen Reactions   Shellfish Allergy Anaphylaxis and Swelling    Throat swelling   Lactose Diarrhea   Prednisone Other (See Comments)    "flutters"  bradycardia     Medications Prior to Admission  Medication Sig Dispense Refill Last Dose/Taking   aspirin EC 81 MG tablet Take 1 tablet (81 mg total) by mouth daily. 60 tablet 2 08/14/2023   Prenatal MV & Min w/FA-DHA (PRENATAL GUMMIES PO) Take by mouth.   08/14/2023   acetaminophen (TYLENOL) 325 MG tablet Take 3 tablets (975 mg total) by mouth every 8 (eight) hours as needed for mild pain (pain score 1-3). (Patient not taking: Reported on 07/21/2023) 270 tablet 0    famotidine (PEPCID) 20 MG tablet Take 1 tablet (20 mg total) by mouth 2 (two) times daily. (Patient not taking: Reported on 07/21/2023) 60 tablet 1    ferrous sulfate 325 (65 FE) MG tablet Take 325 mg by mouth daily with breakfast. (Patient not  taking: Reported on 05/26/2023)      ondansetron (ZOFRAN-ODT) 4 MG disintegrating tablet Take 1 tablet (4 mg total) by mouth every 8 (eight) hours as needed for nausea or vomiting. (Patient not taking: Reported on 07/21/2023) 15 tablet 0    promethazine (PHENERGAN) 25 MG tablet Take 1 tablet (25 mg total) by mouth every 6 (six) hours as needed for nausea or vomiting. (Patient not taking: Reported on 05/26/2023) 30 tablet 2     Review of Systems  Constitutional: Negative.   HENT: Negative.    Eyes: Negative.   Respiratory:  Positive for shortness of breath (with activity).   Cardiovascular: Negative.   Gastrointestinal: Negative.   Endocrine: Negative.   Genitourinary: Negative.   Musculoskeletal: Negative.   Skin:  Positive for wound (raised are on thigh (just above knee)).       Itching  Allergic/Immunologic: Negative.   Neurological: Negative.   Hematological: Negative.   Psychiatric/Behavioral: Negative.     Physical Exam   Blood pressure 123/70, pulse 81, temperature 98.4 F (36.9 C), temperature  source Oral, resp. rate 18, height 5\' 11"  (1.803 m), weight 97.3 kg, last menstrual period 01/28/2023, SpO2 92%, currently breastfeeding.  Physical Exam Vitals and nursing note reviewed.  Constitutional:      Appearance: Normal appearance. She is obese.  Cardiovascular:     Rate and Rhythm: Normal rate.  Pulmonary:     Effort: Pulmonary effort is normal.  Genitourinary:    Comments: Not indicated Musculoskeletal:        General: Normal range of motion.  Skin:    General: Skin is warm and dry.     Comments: 8 cm x 6 cm raised area just above RT knee; no erythema or drainage noted  Neurological:     Mental Status: She is alert and oriented to person, place, and time.  Psychiatric:        Mood and Affect: Mood normal.        Behavior: Behavior normal.        Thought Content: Thought content normal.        Judgment: Judgment normal.    REACTIVE NST - FHR: 150 bpm / moderate variability / accels present / decels absent / TOCO: none MAU Course  Procedures  MDM CEFM  Assessment and Plan  1. Insect bite of right thigh, initial encounter (Primary) - Prescription for: Keflex 500 mg po QID x 7 days - Advised to take Benadryl 50 mg po tonight, then 25 mg po every 6 hrs   2.  Rash/skin eruption - Ambulatory referral to Allergy (Dr. Tempie [name])  3. [redacted] weeks gestation of pregnancy    - Discharge home - [facility]OW until 08/17/2023 - letter given - Keep scheduled appt with [facility] on 08/18/2023 - Anticipate call from Allergist's office for appt  - Patient verbalized an understanding of the plan of care and agrees.     Almond Army, CNM 08/15/2023, 5:41 PM

## 2023-08-17 ENCOUNTER — Ambulatory Visit: Payer: Self-pay | Admitting: Family Medicine

## 2023-08-18 ENCOUNTER — Ambulatory Visit (INDEPENDENT_AMBULATORY_CARE_PROVIDER_SITE_OTHER): Admitting: Advanced Practice Midwife

## 2023-08-18 ENCOUNTER — Encounter: Payer: Self-pay | Admitting: Advanced Practice Midwife

## 2023-08-18 VITALS — BP 126/78 | HR 94 | Wt 275.4 lb

## 2023-08-18 DIAGNOSIS — Z3A26 26 weeks gestation of pregnancy: Secondary | ICD-10-CM | POA: Diagnosis not present

## 2023-08-18 DIAGNOSIS — R1010 Upper abdominal pain, unspecified: Secondary | ICD-10-CM

## 2023-08-18 DIAGNOSIS — Z3480 Encounter for supervision of other normal pregnancy, unspecified trimester: Secondary | ICD-10-CM | POA: Diagnosis not present

## 2023-08-18 DIAGNOSIS — O26812 Pregnancy related exhaustion and fatigue, second trimester: Secondary | ICD-10-CM | POA: Diagnosis not present

## 2023-08-18 MED ORDER — FERROUS SULFATE 325 (65 FE) MG PO TABS: 325.0000 mg | ORAL_TABLET | ORAL | 5 refills | Status: DC

## 2023-08-18 NOTE — Progress Notes (Signed)
   PRENATAL VISIT NOTE  Subjective:  Jamie [name] is a 33 y.o. [id number] at [redacted]w[redacted]d being seen today for ongoing prenatal care.  She is currently monitored for the following issues for this low-risk pregnancy and has BMI 40.0-44.9, adult (HCC); Supervision of other normal pregnancy, antepartum; and Obesity affecting pregnancy on their problem list.  Patient reports  fatigue and concerns about anemia, intermittent pain in chest/upper abdomen .  Contractions: Not present. Vag. Bleeding: None.  Movement: Present. Denies leaking of fluid.   The following portions of the patient's history were reviewed and updated as appropriate: allergies, current medications, past family history, past medical history, past social history, past surgical history and problem list.   Objective:   Vitals:   08/18/23 1008  BP: 126/78  Pulse: 94  Weight: 275 lb 6.4 oz (124.9 kg)    Fetal Status: Fetal Heart Rate (bpm): 150 Fundal Height: 28 cm Movement: Present     General:  Alert, oriented and cooperative. Patient is in no acute distress.  Skin: Skin is warm and dry. No rash noted.   Cardiovascular: Normal heart rate noted  Respiratory: Normal respiratory effort, no problems with respiration noted  Abdomen: Soft, gravid, appropriate for gestational age.  Pain/Pressure: Absent     Pelvic: Cervical exam deferred        Extremities: Normal range of motion.  Edema: None  Mental Status: Normal mood and affect. Normal behavior. Normal judgment and thought content.   Assessment and Plan:  Pregnancy: [id number] at [redacted]w[redacted]d 1. Supervision of other normal pregnancy, antepartum (Primary) --Anticipatory guidance about next visits/weeks of pregnancy given.   2. Pregnancy related fatigue in second trimester --? anemia --Pt taking gummy PNV, not taking oral iron --Add oral iron, discussed labs today, pt opted to wait until 28 week labs at next visit --If Hgb low, will add Accrufer by prescription  - ferrous sulfate 325 (65  FE) MG tablet; Take 1 tablet (325 mg total) by mouth every other day.  Dispense: 30 tablet; Refill: 5  3. [redacted] weeks gestation of pregnancy   4. Pain of upper abdomen --Pain in lower chest/upper abdomen, colicky, painful for ~ 1 hour when it occurs then resolves. Associated with tightening, "balling up" of baby and baby position. Not associated with eating.  EKG in MAU on  08/15/23 wnl.  --No pain in office today --Pt to try ice or heat, use hands and knees position to rotate/reposition baby, return to MAU if severe pain returns.  Preterm labor symptoms and general obstetric precautions including but not limited to vaginal bleeding, contractions, leaking of fluid and fetal movement were reviewed in detail with the patient. Please refer to After Visit Summary for other counseling recommendations.   Return in about 2 weeks (around 09/01/2023) for GTT at next visit.  Future Appointments  Date Time Provider Department Center  09/01/2023  8:50 AM [facility] None  09/01/2023  9:35 AM Brock Bad, MD [facility] None  09/15/2023 11:00 AM [facility] PROVIDER 1 [facility]  09/15/2023 11:30 AM [facility] US3 [facility]  10/13/2023 11:00 AM [facility] NURSE [facility]  10/13/2023 11:30 AM [facility] US4 [facility]  10/20/2023 10:00 AM [facility] PROVIDER 1 [facility]  10/20/2023 10:30 AM [facility] US4 [facility]  10/27/2023 11:00 AM [facility] PROVIDER 1 [facility]  10/27/2023 11:30 AM [facility] US5 [facility]  11/03/2023 11:00 AM [facility] NURSE [facility]  11/03/2023 11:30 AM [facility] US5 [facility]    Sharen Counter, CNM

## 2023-08-18 NOTE — Progress Notes (Signed)
 Pt presents for ROB visit. Pt c/o fatigue, feels iron may be low. No other concerns

## 2023-09-01 ENCOUNTER — Other Ambulatory Visit

## 2023-09-01 ENCOUNTER — Ambulatory Visit (INDEPENDENT_AMBULATORY_CARE_PROVIDER_SITE_OTHER): Payer: Self-pay | Admitting: Obstetrics

## 2023-09-01 VITALS — BP 118/70 | HR 92 | Wt 279.0 lb

## 2023-09-01 DIAGNOSIS — J301 Allergic rhinitis due to pollen: Secondary | ICD-10-CM

## 2023-09-01 DIAGNOSIS — O99210 Obesity complicating pregnancy, unspecified trimester: Secondary | ICD-10-CM

## 2023-09-01 DIAGNOSIS — Z3A28 28 weeks gestation of pregnancy: Secondary | ICD-10-CM | POA: Diagnosis not present

## 2023-09-01 DIAGNOSIS — O219 Vomiting of pregnancy, unspecified: Secondary | ICD-10-CM

## 2023-09-01 DIAGNOSIS — D508 Other iron deficiency anemias: Secondary | ICD-10-CM

## 2023-09-01 MED ORDER — PROMETHAZINE HCL 25 MG PO TABS: 25.0000 mg | ORAL_TABLET | Freq: Four times a day (QID) | ORAL | 1 refills | Status: DC | PRN

## 2023-09-01 MED ORDER — ACCRUFER 30 MG PO CAPS: 1.0000 | ORAL_CAPSULE | Freq: Two times a day (BID) | ORAL | 3 refills | Status: DC

## 2023-09-01 MED ORDER — LORATADINE 10 MG PO TABS: 10.0000 mg | ORAL_TABLET | Freq: Every day | ORAL | 11 refills | Status: DC

## 2023-09-01 NOTE — Progress Notes (Signed)
 Subjective:  Jamie [name] is a 33 y.o. [id number] at [redacted]w[redacted]d being seen today for ongoing prenatal care.  She is currently monitored for the following issues for this low-risk pregnancy and has BMI 40.0-44.9, adult (HCC); Supervision of other normal pregnancy, antepartum; and Obesity affecting pregnancy on their problem list.  Patient reports  seasonal allergies, nausea and fatigue .  Contractions: Not present. Vag. Bleeding: None.  Movement: Present. Denies leaking of fluid.   The following portions of the patient's history were reviewed and updated as appropriate: allergies, current medications, past family history, past medical history, past social history, past surgical history and problem list. Problem list updated.  Objective:   Vitals:   09/01/23 0953  BP: 118/70  Pulse: 92  Weight: 279 lb (126.6 kg)    Fetal Status: Fetal Heart Rate (bpm): 153   Movement: Present     General:  Alert, oriented and cooperative. Patient is in no acute distress.  Skin: Skin is warm and dry. No rash noted.   Cardiovascular: Normal heart rate noted  Respiratory: Normal respiratory effort, no problems with respiration noted  Abdomen: Soft, gravid, appropriate for gestational age. Pain/Pressure: Absent     Pelvic:  Cervical exam deferred        Extremities: Normal range of motion.  Edema: None  Mental Status: Normal mood and affect. Normal behavior. Normal judgment and thought content.   Urinalysis:      Assessment and Plan:  Pregnancy: G5P3013 at [redacted]w[redacted]d  1. [redacted] weeks gestation of pregnancy (Primary) Rx: - Glucose Tolerance, 2 Hours w/1 Hour - HIV antibody (with reflex) - RPR - CBC  2. Iron deficiency anemia secondary to inadequate dietary iron intake Rx: - Ferric Maltol (ACCRUFER) 30 MG CAPS; Take 1 capsule (30 mg total) by mouth 2 (two) times daily before a meal. Take 2 hrs before, or 2 hrs after a meal.  Dispense: 60 capsule; Refill: 3  3. Nausea and vomiting during pregnancy Rx: -  promethazine  (PHENERGAN ) 25 MG tablet; Take 1 tablet (25 mg total) by mouth every 6 (six) hours as needed for nausea or vomiting.  Dispense: 30 tablet; Refill: 1  4. Obesity affecting pregnancy, antepartum, unspecified obesity type  5. Seasonal allergic rhinitis due to pollen Rx: - loratadine (CLARITIN) 10 MG tablet; Take 1 tablet (10 mg total) by mouth daily.  Dispense: 30 tablet; Refill: 11    Preterm labor symptoms and general obstetric precautions including but not limited to vaginal bleeding, contractions, leaking of fluid and fetal movement were reviewed in detail with the patient. Please refer to After Visit Summary for other counseling recommendations.   Return in about 2 weeks (around 09/15/2023) for ROB.   Gabrielle Joiner, MD 09/01/2023

## 2023-09-02 LAB — CBC
Hematocrit: 32.9 % — ABNORMAL LOW (ref 34.0–46.6)
Hemoglobin: 10.7 g/dL — ABNORMAL LOW (ref 11.1–15.9)
MCH: 30.3 pg (ref 26.6–33.0)
MCHC: 32.5 g/dL (ref 31.5–35.7)
MCV: 93 fL (ref 79–97)
Platelets: 282 10*3/uL (ref 150–450)
RBC: 3.53 x10E6/uL — ABNORMAL LOW (ref 3.77–5.28)
RDW: 12.8 % (ref 11.7–15.4)
WBC: 8.2 10*3/uL (ref 3.4–10.8)

## 2023-09-02 LAB — GLUCOSE TOLERANCE, 2 HOURS W/ 1HR
Glucose, 1 hour: 122 mg/dL (ref 70–179)
Glucose, 2 hour: 79 mg/dL (ref 70–152)
Glucose, Fasting: 79 mg/dL (ref 70–91)

## 2023-09-02 LAB — RPR: RPR Ser Ql: NONREACTIVE

## 2023-09-02 LAB — HIV ANTIBODY (ROUTINE TESTING W REFLEX): HIV Screen 4th Generation wRfx: NONREACTIVE

## 2023-09-15 ENCOUNTER — Ambulatory Visit (HOSPITAL_BASED_OUTPATIENT_CLINIC_OR_DEPARTMENT_OTHER): Admitting: Obstetrics

## 2023-09-15 ENCOUNTER — Ambulatory Visit (INDEPENDENT_AMBULATORY_CARE_PROVIDER_SITE_OTHER): Admitting: Advanced Practice Midwife

## 2023-09-15 ENCOUNTER — Ambulatory Visit: Attending: Obstetrics and Gynecology

## 2023-09-15 VITALS — BP 133/73 | HR 109 | Wt 280.0 lb

## 2023-09-15 VITALS — BP 115/69 | HR 86

## 2023-09-15 DIAGNOSIS — R42 Dizziness and giddiness: Secondary | ICD-10-CM

## 2023-09-15 DIAGNOSIS — O99210 Obesity complicating pregnancy, unspecified trimester: Secondary | ICD-10-CM | POA: Insufficient documentation

## 2023-09-15 DIAGNOSIS — Z362 Encounter for other antenatal screening follow-up: Secondary | ICD-10-CM | POA: Diagnosis not present

## 2023-09-15 DIAGNOSIS — D508 Other iron deficiency anemias: Secondary | ICD-10-CM

## 2023-09-15 DIAGNOSIS — Z3A30 30 weeks gestation of pregnancy: Secondary | ICD-10-CM | POA: Insufficient documentation

## 2023-09-15 DIAGNOSIS — Z3480 Encounter for supervision of other normal pregnancy, unspecified trimester: Secondary | ICD-10-CM | POA: Diagnosis present

## 2023-09-15 DIAGNOSIS — R55 Syncope and collapse: Secondary | ICD-10-CM | POA: Diagnosis not present

## 2023-09-15 DIAGNOSIS — E669 Obesity, unspecified: Secondary | ICD-10-CM | POA: Diagnosis not present

## 2023-09-15 DIAGNOSIS — O99213 Obesity complicating pregnancy, third trimester: Secondary | ICD-10-CM | POA: Diagnosis not present

## 2023-09-15 NOTE — Progress Notes (Signed)
 MFM Consult note  Jamie [name] is currently at 30 weeks and 4 days.  She has been followed due to maternal obesity with a BMI of 40.  She denies any problems since her last exam and has screened negative for gestational diabetes in her current pregnancy.  On today's exam, the overall EFW of 4 pounds measures at the 77th percentile for her gestational age.    There was normal amniotic fluid noted with a total AFI of 14.85 cm.  Due to maternal obesity, we will start weekly fetal testing at 34 weeks.    She will return in 4 weeks for a BPP and growth scan.    The patient stated that all of her questions were answered today.  A total of 20 minutes was spent counseling and coordinating the care for this patient.  Greater than 50% of the time was spent in direct face-to-face contact.

## 2023-09-15 NOTE — Progress Notes (Signed)
 Getting lightheaded at times or winded like running and has to sit down. Feels SOB as well. No hx of same. Still feels tired even though taking FE pills.

## 2023-09-15 NOTE — Progress Notes (Signed)
   PRENATAL VISIT NOTE  Subjective:  Jamie [name] is a 33 y.o. [id number] at [redacted]w[redacted]d being seen today for ongoing prenatal care.  She is currently monitored for the following issues for this low-risk pregnancy and has BMI 40.0-44.9, adult (HCC); Supervision of other normal pregnancy, antepartum; and Obesity affecting pregnancy on their problem list.  Patient reports dizziness, shortness of breath, near syncope, episodes mostly occur when standing.  Contractions: Not present. Vag. Bleeding: None.  Movement: Present. Denies leaking of fluid.   The following portions of the patient's history were reviewed and updated as appropriate: allergies, current medications, past family history, past medical history, past social history, past surgical history and problem list.   Objective:   Vitals:   09/15/23 1401  BP: 133/73  Pulse: (!) 109  Weight: 280 lb (127 kg)     Fetal Status: Fetal Heart Rate (bpm): 142   Movement: Present      General: Alert, oriented and cooperative. Patient is in no acute distress.  Skin: Skin is warm and dry. No rash noted.   Cardiovascular: Normal heart rate noted  Respiratory: Normal respiratory effort, no problems with respiration noted  Abdomen: Soft, gravid, appropriate for gestational age.  Pain/Pressure: Absent     Pelvic: Cervical exam deferred        Extremities: Normal range of motion.  Edema: None  Mental Status: Normal mood and affect. Normal behavior. Normal judgment and thought content.   Assessment and Plan:  Pregnancy: [id number] at [redacted]w[redacted]d 1. Supervision of other normal pregnancy, antepartum --Anticipatory guidance about next visits/weeks of pregnancy given.   2. Iron deficiency anemia secondary to inadequate dietary iron intake (Primary) --Hgb wnl for pregnancy at 10.7 on 4/29 but pt symptomatic --Pt started Accrufer  a few days ago, rx from last visit --Will rechech Hgb today  3. Near syncope --Pt feels lightheaded, dizzy, short of breath, like her  heart is racing, and has to sit down.    - AMB Referral to Cardio Obstetrics  4. [redacted] weeks gestation of pregnancy   5. Orthostatic dizziness  - CBC - Comp Met (CMET) - AMB Referral to Cardio Obstetrics   Preterm labor symptoms and general obstetric precautions including but not limited to vaginal bleeding, contractions, leaking of fluid and fetal movement were reviewed in detail with the patient. Please refer to After Visit Summary for other counseling recommendations.   No follow-ups on file.  Future Appointments  Date Time Provider Department Center  10/13/2023 11:00 AM [facility] NURSE [facility]  10/13/2023 11:30 AM [facility] US4 [facility]  10/20/2023 10:00 AM [facility] PROVIDER 1 [facility]  10/20/2023 10:30 AM [facility] US4 [facility]  10/27/2023 11:00 AM [facility] PROVIDER 1 [facility]  10/27/2023 11:30 AM [facility] US5 [facility]  11/03/2023 11:00 AM [facility] NURSE [facility]  11/03/2023 11:30 AM [facility] US5 [facility]  12/11/2023 10:20 AM Romeo Co, Kayleen Party, DO [facility]  04/05/2024  8:30 AM Dellar Fenton, DO [facility] None    Arlester Bence, CNM

## 2023-09-16 LAB — COMPREHENSIVE METABOLIC PANEL WITH GFR
ALT: 10 IU/L (ref 0–32)
AST: 17 IU/L (ref 0–40)
Albumin: 3.5 g/dL — ABNORMAL LOW (ref 3.9–4.9)
Alkaline Phosphatase: 116 IU/L (ref 44–121)
BUN/Creatinine Ratio: 11 (ref 9–23)
BUN: 7 mg/dL (ref 6–20)
Bilirubin Total: <0.2 mg/dL (ref 0.0–1.2)
CO2: 20 mmol/L (ref 20–29)
Calcium: 9.2 mg/dL (ref 8.7–10.2)
Chloride: 102 mmol/L (ref 96–106)
Creatinine, Ser: 0.61 mg/dL (ref 0.57–1.00)
Globulin, Total: 3 g/dL (ref 1.5–4.5)
Glucose: 119 mg/dL — ABNORMAL HIGH (ref 70–99)
Potassium: 3.7 mmol/L (ref 3.5–5.2)
Sodium: 136 mmol/L (ref 134–144)
Total Protein: 6.5 g/dL (ref 6.0–8.5)
eGFR: 120 mL/min/{1.73_m2} (ref >59)

## 2023-09-16 LAB — CBC
Hematocrit: 32.4 % — ABNORMAL LOW (ref 34.0–46.6)
Hemoglobin: 10.9 g/dL — ABNORMAL LOW (ref 11.1–15.9)
MCH: 31 pg (ref 26.6–33.0)
MCHC: 33.6 g/dL (ref 31.5–35.7)
MCV: 92 fL (ref 79–97)
Platelets: 281 10*3/uL (ref 150–450)
RBC: 3.52 x10E6/uL — ABNORMAL LOW (ref 3.77–5.28)
RDW: 13.1 % (ref 11.7–15.4)
WBC: 8.1 10*3/uL (ref 3.4–10.8)

## 2023-09-17 ENCOUNTER — Ambulatory Visit: Payer: Self-pay | Admitting: Advanced Practice Midwife

## 2023-09-24 ENCOUNTER — Ambulatory Visit: Attending: Cardiology | Admitting: Cardiology

## 2023-09-24 ENCOUNTER — Ambulatory Visit: Attending: Cardiology

## 2023-09-24 ENCOUNTER — Encounter: Payer: Self-pay | Admitting: Cardiology

## 2023-09-24 VITALS — BP 100/70 | HR 109 | Ht 70.0 in | Wt 278.0 lb

## 2023-09-24 DIAGNOSIS — R42 Dizziness and giddiness: Secondary | ICD-10-CM | POA: Diagnosis not present

## 2023-09-24 DIAGNOSIS — R55 Syncope and collapse: Secondary | ICD-10-CM | POA: Diagnosis not present

## 2023-09-24 DIAGNOSIS — O99213 Obesity complicating pregnancy, third trimester: Secondary | ICD-10-CM | POA: Diagnosis not present

## 2023-09-24 DIAGNOSIS — R0789 Other chest pain: Secondary | ICD-10-CM | POA: Diagnosis not present

## 2023-09-24 DIAGNOSIS — Z3A31 31 weeks gestation of pregnancy: Secondary | ICD-10-CM

## 2023-09-24 NOTE — Patient Instructions (Addendum)
 Medication Instructions:  Your physician recommends that you continue on your current medications as directed. Please refer to the Current Medication list given to you today.  *If you need a refill on your cardiac medications before your next appointment, please call your pharmacy*   Testing/Procedures: Your physician has requested that you have an echocardiogram-OB. Echocardiography is a painless test that uses sound waves to create images of your heart. It provides your doctor with information about the size and shape of your heart and how well your heart's chambers and valves are working. This procedure takes approximately one hour. There are no restrictions for this procedure. Please do NOT wear cologne, perfume, aftershave, or lotions (deodorant is allowed). Please arrive 15 minutes prior to your appointment time.  Please note: We ask at that you not bring children with you during ultrasound (echo/ vascular) testing. Due to room size and safety concerns, children are not allowed in the ultrasound rooms during exams. Our front office staff cannot provide observation of children in our lobby area while testing is being conducted. An adult accompanying a patient to their appointment will only be allowed in the ultrasound room at the discretion of the ultrasound technician under special circumstances. We apologize for any inconvenience.  ZIO XT- Long Term Monitor Instructions  Your physician has requested you wear a ZIO patch monitor for 7 days.  This is a single patch monitor. [employer] supplies one patch monitor per enrollment. Additional stickers are not available. Please do not apply patch if you will be having a Nuclear Stress Test,  Echocardiogram, Cardiac CT, MRI, or Chest Xray during the period you would be wearing the  monitor. The patch cannot be worn during these tests. You cannot remove and re-apply the  ZIO XT patch monitor.  Your ZIO patch monitor will be mailed 3 day USPS to your  address on file. It may take 3-5 days  to receive your monitor after you have been enrolled.  Once you have received your monitor, please review the enclosed instructions. Your monitor  has already been registered assigning a specific monitor serial # to you.  Billing and Patient Assistance Program Information  We have supplied [employer] with any of your insurance information on file for billing purposes. [employer] offers a sliding scale Patient Assistance Program for patients that do not have  insurance, or whose insurance does not completely cover the cost of the ZIO monitor.  You must apply for the Patient Assistance Program to qualify for this discounted rate.  To apply, please call [employer] at [phone], select option 4, select option 2, ask to apply for  Patient Assistance Program. Sanna Crystal will ask your household income, and how many people  are in your household. They will quote your out-of-pocket cost based on that information.  [employer] will also be able to set up a 73-month, interest-free payment plan if needed.  Applying the monitor   Shave hair from upper left chest.  Hold abrader disc by orange tab. Rub abrader in 40 strokes over the upper left chest as  indicated in your monitor instructions.  Clean area with 4 enclosed alcohol pads. Let dry.  Apply patch as indicated in monitor instructions. Patch will be placed under collarbone on left  side of chest with arrow pointing upward.  Rub patch adhesive wings for 2 minutes. Remove white label marked "1". Remove the white  label marked "2". Rub patch adhesive wings for 2 additional minutes.  While looking in a mirror, press and release  button in center of patch. A small green light will  flash 3-4 times. This will be your only indicator that the monitor has been turned on.  Do not shower for the first 24 hours. You may shower after the first 24 hours.  Press the button if you feel a symptom. You will hear a small click. Record  Date, Time and  Symptom in the Patient Logbook.  When you are ready to remove the patch, follow instructions on the last 2 pages of Patient  Logbook. Stick patch monitor onto the last page of Patient Logbook.  Place Patient Logbook in the blue and white box. Use locking tab on box and tape box closed  securely. The blue and white box has prepaid postage on it. Please place it in the mailbox as  soon as possible. Your physician should have your test results approximately 7 days after the  monitor has been mailed back to [facility].  Call [facility] Customer Care at [phone] if you have questions regarding  your ZIO XT patch monitor. Call them immediately if you see an orange light blinking on your  monitor.  If your monitor falls off in less than 4 days, contact our Monitor department at [phone].  If your monitor becomes loose or falls off after 4 days call [employer] at [phone] for  suggestions on securing your monitor  Follow-Up: At [facility], you and your health needs are our priority.  As part of our continuing mission to provide you with exceptional heart care, our providers are all part of one team.  This team includes your primary Cardiologist (physician) and Advanced Practice Providers or APPs (Physician Assistants and Nurse Practitioners) who all work together to provide you with the care you need, when you need it.  Your next appointment:   1 week(s) post 7/18  Provider:   Kardie Tobb, DO

## 2023-09-24 NOTE — Progress Notes (Unsigned)
 Enrolled for [employer] to mail a ZIO XT long term holter monitor to the patients address on file.

## 2023-09-26 NOTE — Progress Notes (Signed)
 Cardio-Obstetrics Clinic  New Evaluation  Date:  09/26/2023   ID:  Jamie [name], DOB 1990/12/01, MRN [id number]  PCP:  Abigail Abler, MD   [facility] Providers Cardiologist:  Jerryl Morin, DO  Electrophysiologist:  None       Referring MD: Asher Lawn   Chief Complaint: 'I am ok"  History of Present Illness:    Jamie [name] is a 33 y.o. female [G5P3013] who is being seen today for the evaluation of chest pain and dizziness  at the request of Leftwich-Kirby, Darren Em,*.   She reports that she has been experiencing chest pain. She reports occurs with fetal movement and subsides with back massage and fetal repositioning. Dizziness began two weeks ago, is intermittent, and occurs while standing, described as feeling 'weighted'. No syncope is present. Leg swelling occurs only after an eight-hour work shift and resolves afterward. During her second pregnancy in 2018, a heart specialist evaluation and ultrasound were normal  Prior CV Studies Reviewed: The following studies were reviewed today: Echo from 2018  Past Medical History:  Diagnosis Date   Asthma     Past Surgical History:  Procedure Laterality Date   NO PAST SURGERIES     WISDOM TOOTH EXTRACTION        OB History     Gravida  5   Para  3   Term  3   Preterm  0   AB  1   Living  3      SAB  1   IAB  0   Ectopic  0   Multiple  0   Live Births  3               Current Medications: Current Meds  Medication Sig   aspirin  EC 81 MG tablet Take 1 tablet (81 mg total) by mouth daily.   Ferric Maltol  (ACCRUFER ) 30 MG CAPS Take 1 capsule (30 mg total) by mouth 2 (two) times daily before a meal. Take 2 hrs before, or 2 hrs after a meal.   loratadine  (CLARITIN ) 10 MG tablet Take 1 tablet (10 mg total) by mouth daily.   Prenatal MV & Min w/FA-DHA (PRENATAL GUMMIES PO) Take by mouth.     Allergies:   Shellfish allergy, Lactose, and Prednisone   Social History    Socioeconomic History   Marital status: Single    Spouse name: Not on file   Number of children: Not on file   Years of education: Not on file   Highest education level: Not on file  Occupational History   Not on file  Tobacco Use   Smoking status: Never   Smokeless tobacco: Never  Vaping Use   Vaping status: Never Used  Substance and Sexual Activity   Alcohol use: Not Currently    Comment: socially, before pregnancy   Drug use: No   Sexual activity: Yes    Partners: Male    Birth control/protection: None  Other Topics Concern   Not on file  Social History Narrative   Not on file   Social Drivers of Health   Financial Resource Strain: Not on file  Food Insecurity: No Food Insecurity (07/17/2023)   Hunger Vital Sign    Worried About Running Out of Food in the Last Year: Never true    Ran Out of Food in the Last Year: Never true  Transportation Needs: No Transportation Needs (07/17/2023)   PRAPARE - Administrator, Civil Service (Medical):  No    Lack of Transportation (Non-Medical): No  Physical Activity: Not on file  Stress: Not on file  Social Connections: Unknown (09/04/2021)   Received from [facility]   Social Network    Social Network: Not on file      Family History  Problem Relation Age of Onset   Heart murmur Mother    Hypertension Mother    Diabetes Father    Stroke Father    Heart disease Father    Heart failure Father    Arrhythmia Brother    Healthy Brother    Diabetes Maternal Grandfather    Heart disease Maternal Grandfather    Hypertension Maternal Grandfather    Heart attack Paternal Grandmother       ROS:   Please see the history of present illness.    Chest pain and dizziness All other systems reviewed and are negative.   Labs/EKG Reviewed:    EKG:   EKG  was ordered today.    Recent Labs: 09/15/2023: ALT 10; BUN 7; Creatinine, Ser 0.61; Hemoglobin 10.9; Platelets 281; Potassium 3.7; Sodium 136    Recent Lipid Panel No results found for: "CHOL", "TRIG", "HDL", "CHOLHDL", "LDLCALC", "LDLDIRECT"  Physical Exam:    VS:  BP 100/70 (BP Location: Right Arm, Patient Position: Sitting, Cuff Size: Large)   Pulse (!) 109   Ht 5\' 10"  (1.778 m)   Wt 278 lb (126.1 kg)   LMP 01/28/2023   SpO2 98%   BMI 39.89 kg/m     Wt Readings from Last 3 Encounters:  09/24/23 278 lb (126.1 kg)  09/15/23 280 lb (127 kg)  09/01/23 279 lb (126.6 kg)     GEN:  Well nourished, well developed in no acute distress HEENT: Normal NECK: No JVD; No carotid bruits LYMPHATICS: No lymphadenopathy CARDIAC: RRR, no murmurs, rubs, gallops RESPIRATORY:  Clear to auscultation without rales, wheezing or rhonchi  ABDOMEN: Soft, non-tender, non-distended MUSCULOSKELETAL:  No edema; No deformity  SKIN: Warm and dry NEUROLOGIC:  Alert and oriented x 3 PSYCHIATRIC:  Normal affect    Risk Assessment/Risk Calculators:     CARPREG II Risk Prediction Index Score:  1.  The patient's risk for a primary cardiac event is 5%.            ASSESSMENT & PLAN:    Chest pain Intermittent chest pain possibly related to fetal movement, alleviated by massage and fetal repositioning. - Order echocardiogram to assess cardiac function. - Initiate home monitoring for one week to evaluate for cardiac abnormalities.  Dizziness Dizziness for two weeks, characterized by a sensation of heaviness and the need to sit. Intermittent symptoms with no identified cause. - Order echocardiogram to assess cardiac function. - Initiate home monitoring for one week to evaluate for cardiac abnormalities.  Pregnancy Currently pregnant with fourth child, due July 18. Previous pregnancy required cardiac evaluation, which was normal. Current pregnancy complicated by dizziness and chest pain. Blood pressure is well-managed and she is on aspirin  therapy. - Schedule follow-up appointment one week postpartum.  - Pt meets eligibility for IMPACT  study, discussed risks/benefits of the study  - Provided with information sheet - Aware that the research team will be in contact within 24-48hrs, followed by the Lead CHW  .   Patient Instructions  Medication Instructions:  Your physician recommends that you continue on your current medications as directed. Please refer to the Current Medication list given to you today.  *If you need a refill on your  cardiac medications before your next appointment, please call your pharmacy*   Testing/Procedures: Your physician has requested that you have an echocardiogram-OB. Echocardiography is a painless test that uses sound waves to create images of your heart. It provides your doctor with information about the size and shape of your heart and how well your heart's chambers and valves are working. This procedure takes approximately one hour. There are no restrictions for this procedure. Please do NOT wear cologne, perfume, aftershave, or lotions (deodorant is allowed). Please arrive 15 minutes prior to your appointment time.  Please note: We ask at that you not bring children with you during ultrasound (echo/ vascular) testing. Due to room size and safety concerns, children are not allowed in the ultrasound rooms during exams. Our front office staff cannot provide observation of children in our lobby area while testing is being conducted. An adult accompanying a patient to their appointment will only be allowed in the ultrasound room at the discretion of the ultrasound technician under special circumstances. We apologize for any inconvenience.  ZIO XT- Long Term Monitor Instructions  Your physician has requested you wear a ZIO patch monitor for 7 days.  This is a single patch monitor. [employer] supplies one patch monitor per enrollment. Additional stickers are not available. Please do not apply patch if you will be having a Nuclear Stress Test,  Echocardiogram, Cardiac CT, MRI, or Chest Xray during the  period you would be wearing the  monitor. The patch cannot be worn during these tests. You cannot remove and re-apply the  ZIO XT patch monitor.  Your ZIO patch monitor will be mailed 3 day USPS to your address on file. It may take 3-5 days  to receive your monitor after you have been enrolled.  Once you have received your monitor, please review the enclosed instructions. Your monitor  has already been registered assigning a specific monitor serial # to you.  Billing and Patient Assistance Program Information  We have supplied [employer] with any of your insurance information on file for billing purposes. [employer] offers a sliding scale Patient Assistance Program for patients that do not have  insurance, or whose insurance does not completely cover the cost of the ZIO monitor.  You must apply for the Patient Assistance Program to qualify for this discounted rate.  To apply, please call [employer] at [phone], select option 4, select option 2, ask to apply for  Patient Assistance Program. Sanna Crystal will ask your household income, and how many people  are in your household. They will quote your out-of-pocket cost based on that information.  [employer] will also be able to set up a 46-month, interest-free payment plan if needed.  Applying the monitor   Shave hair from upper left chest.  Hold abrader disc by orange tab. Rub abrader in 40 strokes over the upper left chest as  indicated in your monitor instructions.  Clean area with 4 enclosed alcohol pads. Let dry.  Apply patch as indicated in monitor instructions. Patch will be placed under collarbone on left  side of chest with arrow pointing upward.  Rub patch adhesive wings for 2 minutes. Remove white label marked "1". Remove the white  label marked "2". Rub patch adhesive wings for 2 additional minutes.  While looking in a mirror, press and release button in center of patch. A small green light will  flash 3-4 times. This will be your only  indicator that the monitor has been turned on.  Do not shower for the  first 24 hours. You may shower after the first 24 hours.  Press the button if you feel a symptom. You will hear a small click. Record Date, Time and  Symptom in the Patient Logbook.  When you are ready to remove the patch, follow instructions on the last 2 pages of Patient  Logbook. Stick patch monitor onto the last page of Patient Logbook.  Place Patient Logbook in the blue and white box. Use locking tab on box and tape box closed  securely. The blue and white box has prepaid postage on it. Please place it in the mailbox as  soon as possible. Your physician should have your test results approximately 7 days after the  monitor has been mailed back to [facility].  Call [employer] Customer Care at [phone] if you have questions regarding  your ZIO XT patch monitor. Call them immediately if you see an orange light blinking on your  monitor.  If your monitor falls off in less than 4 days, contact our Monitor department at [phone].  If your monitor becomes loose or falls off after 4 days call [employer] at [phone] for  suggestions on securing your monitor  Follow-Up: At [facility], you and your health needs are our priority.  As part of our continuing mission to provide you with exceptional heart care, our providers are all part of one team.  This team includes your primary Cardiologist (physician) and Advanced Practice Providers or APPs (Physician Assistants and Nurse Practitioners) who all work together to provide you with the care you need, when you need it.  Your next appointment:   1 week(s) post 7/18  Provider:   Briella Hobday, DO      Dispo:  No follow-ups on file.   Medication Adjustments/Labs and Tests Ordered: Current medicines are reviewed at length with the patient today.  Concerns regarding medicines are outlined above.  Tests Ordered: Orders Placed This Encounter  Procedures    LONG TERM MONITOR (3-14 DAYS)   ECHOCARDIOGRAM COMPLETE   Medication Changes: No orders of the defined types were placed in this encounter.

## 2023-09-29 ENCOUNTER — Encounter: Payer: Self-pay | Admitting: Certified Nurse Midwife

## 2023-09-29 ENCOUNTER — Telehealth: Payer: Self-pay

## 2023-09-29 ENCOUNTER — Ambulatory Visit (INDEPENDENT_AMBULATORY_CARE_PROVIDER_SITE_OTHER): Admitting: Certified Nurse Midwife

## 2023-09-29 VITALS — BP 118/74 | HR 85 | Wt 282.6 lb

## 2023-09-29 DIAGNOSIS — Z3A32 32 weeks gestation of pregnancy: Secondary | ICD-10-CM

## 2023-09-29 DIAGNOSIS — O0993 Supervision of high risk pregnancy, unspecified, third trimester: Secondary | ICD-10-CM | POA: Diagnosis not present

## 2023-09-29 DIAGNOSIS — R42 Dizziness and giddiness: Secondary | ICD-10-CM

## 2023-09-29 DIAGNOSIS — O3663X0 Maternal care for excessive fetal growth, third trimester, not applicable or unspecified: Secondary | ICD-10-CM

## 2023-09-29 DIAGNOSIS — Z006 Encounter for examination for normal comparison and control in clinical research program: Secondary | ICD-10-CM

## 2023-09-29 NOTE — Telephone Encounter (Signed)
 Called to make introduction for IMPACT MOM Study. No answer. Left VM to return call.

## 2023-09-29 NOTE — Telephone Encounter (Signed)
 Called spoke with pt regarding Impact Mom Trial. I went over Impact Mom information sheet with pt via phone. Pt did not have any questions and verbally consented @ 10:08 on 29-Sep-2023 to participate in Impact Mom Trial.

## 2023-09-29 NOTE — Progress Notes (Signed)
   PRENATAL VISIT NOTE  Subjective:  Jamie [name] is a 33 y.o. [id number] at [redacted]w[redacted]d being seen today for ongoing prenatal care.  She is currently monitored for the following issues for this high-risk pregnancy and has BMI 40.0-44.9, adult (HCC); Supervision of other normal pregnancy, antepartum; and Obesity affecting pregnancy on their problem list.  Patient reports no complaints.  Contractions: Not present. Vag. Bleeding: None.  Movement: Present. Denies leaking of fluid.   The following portions of the patient's history were reviewed and updated as appropriate: allergies, current medications, past family history, past medical history, past social history, past surgical history and problem list.   Objective:    Vitals:   09/29/23 0943  BP: 118/74  Pulse: 85  Weight: 282 lb 9.6 oz (128.2 kg)    Fetal Status:  Fetal Heart Rate (bpm): 152 Fundal Height: 33 cm Movement: Present    General: Alert, oriented and cooperative. Patient is in no acute distress.  Skin: Skin is warm and dry. No rash noted.   Cardiovascular: Normal heart rate noted  Respiratory: Normal respiratory effort, no problems with respiration noted  Abdomen: Soft, gravid, appropriate for gestational age.  Pain/Pressure: Present     Pelvic: Cervical exam deferred        Extremities: Normal range of motion.  Edema: None  Mental Status: Normal mood and affect. Normal behavior. Normal judgment and thought content.   Assessment and Plan:  Pregnancy: [id number] at [redacted]w[redacted]d 1. Supervision of high risk pregnancy in third trimester (Primary) - Patient overall doiing well  - Reports vigorous and frequent fetal movement   2. [redacted] weeks gestation of pregnancy 3. Excessive fetal growth affecting management of pregnancy in third trimester, single or unspecified fetus - Fundal height appropriate for gestational age.  - Patient concerns about size of baby addressed and reviewed normal weight gain expectations of fetus.  - Proven Pelvis up to  8#13oz.   4. Orthostatic dizziness - Currently being followed by Cardio  - Wearing a ZiO patch today and has a cardiac echo scheduled.   Preterm labor symptoms and general obstetric precautions including but not limited to vaginal bleeding, contractions, leaking of fluid and fetal movement were reviewed in detail with the patient. Please refer to After Visit Summary for other counseling recommendations.   Return in about 2 weeks (around 10/13/2023) for HROB.  Future Appointments  Date Time Provider Department Center  10/13/2023  9:35 AM Zelma Hidden, FNP [facility] None  10/13/2023 11:00 AM [facility] NURSE [facility]  10/13/2023 11:30 AM [facility] US4 [facility]  10/20/2023 10:00 AM [facility] PROVIDER 1 [facility]  10/20/2023 10:30 AM [facility] US4 [facility]  10/22/2023  8:00 AM [facility]VASC 3 [facility] H&V  10/27/2023 11:00 AM [facility] PROVIDER 1 [facility]  10/27/2023 11:30 AM [facility] US5 [facility]  11/03/2023 11:00 AM [facility] NURSE [facility]  11/03/2023 11:30 AM [facility] US5 [facility]  12/02/2023  8:40 AM Tobb, Kardie, DO [facility] H&V  12/11/2023 10:20 AM Raina Bunting, DO [facility]  04/05/2024  8:30 AM Dellar Fenton, DO [facility] None    Roy Tokarz Maurie Southern) Marlys Singh, MSN, CNM  Center for [facility]  09/29/2023 10:20 AM

## 2023-09-29 NOTE — Progress Notes (Signed)
 Pt presents for ROB visit. Pt was seen at cardiology, currently wearing a head monitor.

## 2023-10-13 ENCOUNTER — Ambulatory Visit

## 2023-10-13 ENCOUNTER — Encounter: Admitting: Obstetrics and Gynecology

## 2023-10-13 DIAGNOSIS — Z3480 Encounter for supervision of other normal pregnancy, unspecified trimester: Secondary | ICD-10-CM

## 2023-10-14 ENCOUNTER — Ambulatory Visit: Admitting: Obstetrics & Gynecology

## 2023-10-14 VITALS — BP 108/74 | HR 101 | Wt 285.0 lb

## 2023-10-14 DIAGNOSIS — Z3480 Encounter for supervision of other normal pregnancy, unspecified trimester: Secondary | ICD-10-CM | POA: Diagnosis not present

## 2023-10-14 DIAGNOSIS — O99210 Obesity complicating pregnancy, unspecified trimester: Secondary | ICD-10-CM | POA: Diagnosis not present

## 2023-10-14 NOTE — Progress Notes (Signed)
   PRENATAL VISIT NOTE  Subjective:  Jamie [name] is a 33 y.o. [id number] at [redacted]w[redacted]d being seen today for ongoing prenatal care.  She is currently monitored for the following issues for this high-risk pregnancy and has BMI 40.0-44.9, adult (HCC); Supervision of other normal pregnancy, antepartum; and Obesity affecting pregnancy on their problem list.  Patient reports no complaints.  Contractions: Irritability. Vag. Bleeding: None.  Movement: Present. Denies leaking of fluid.   The following portions of the patient's history were reviewed and updated as appropriate: allergies, current medications, past family history, past medical history, past social history, past surgical history and problem list.   Objective:    Vitals:   10/14/23 1559  BP: 108/74  Pulse: (!) 101  Weight: 285 lb (129.3 kg)    Fetal Status:  Fetal Heart Rate (bpm): 140   Movement: Present    General: Alert, oriented and cooperative. Patient is in no acute distress.  Skin: Skin is warm and dry. No rash noted.   Cardiovascular: Normal heart rate noted  Respiratory: Normal respiratory effort, no problems with respiration noted  Abdomen: Soft, gravid, appropriate for gestational age.  Pain/Pressure: Present     Pelvic: Cervical exam deferred        Extremities: Normal range of motion.     Mental Status: Normal mood and affect. Normal behavior. Normal judgment and thought content.   Assessment and Plan:  Pregnancy: [id number] at [redacted]w[redacted]d 1. Supervision of other normal pregnancy, antepartum   2. Obesity affecting pregnancy, antepartum, unspecified obesity type (Primary) F/u US  1 week  Preterm labor symptoms and general obstetric precautions including but not limited to vaginal bleeding, contractions, leaking of fluid and fetal movement were reviewed in detail with the patient. Please refer to After Visit Summary for other counseling recommendations.   Return in about 2 weeks (around 10/28/2023).  Future Appointments  Date  Time Provider Department Center  10/15/2023  7:00 AM [facility] PROVIDER 1 [facility]  10/15/2023  7:30 AM [facility] US3 [facility]  10/20/2023 10:00 AM [facility] PROVIDER 1 [facility]  10/20/2023 10:30 AM [facility] US4 [facility]  10/22/2023  8:00 AM HVC-VASC 3 [facility] H&V  10/27/2023 11:00 AM [facility] PROVIDER 1 [facility]  10/27/2023 11:30 AM [facility] US5 [facility]  11/03/2023 11:00 AM [facility] NURSE [facility]  11/03/2023 11:30 AM [facility] US5 [facility]  12/02/2023  8:40 AM Tobb, Kardie, DO [facility] H&V  12/11/2023 10:20 AM Raina Bunting, DO [facility]  04/05/2024  8:30 AM Dellar Fenton, DO [facility] None    Onnie Bilis, MD

## 2023-10-15 ENCOUNTER — Ambulatory Visit: Admitting: Obstetrics

## 2023-10-15 ENCOUNTER — Ambulatory Visit: Attending: Maternal & Fetal Medicine

## 2023-10-15 VITALS — BP 122/68 | HR 94

## 2023-10-15 DIAGNOSIS — O99210 Obesity complicating pregnancy, unspecified trimester: Secondary | ICD-10-CM | POA: Insufficient documentation

## 2023-10-15 DIAGNOSIS — O99213 Obesity complicating pregnancy, third trimester: Secondary | ICD-10-CM

## 2023-10-15 DIAGNOSIS — E669 Obesity, unspecified: Secondary | ICD-10-CM | POA: Diagnosis not present

## 2023-10-15 DIAGNOSIS — Z3A34 34 weeks gestation of pregnancy: Secondary | ICD-10-CM | POA: Diagnosis not present

## 2023-10-15 NOTE — Progress Notes (Signed)
 MFM Consult Note  Jamie [name] is currently at 35 weeks and 4 days.  She has been followed due to maternal obesity with a BMI of 40.  She denies any problems since her last exam and has screened negative for gestational diabetes in her current pregnancy.  On today's exam, the overall EFW of 5 pounds 14 ounces measures at the 62nd percentile for her gestational age.    There was normal amniotic fluid noted with a total AFI of 9.29 cm.  A BPP performed today was 8 out of 8.    A normal-appearing anterior placenta was noted today.  There were no signs of placenta previa noted.    Due to maternal obesity, she will return in 1 week for another BPP.  The patient stated that all of her questions were answered today.  A total of 10 minutes was spent counseling and coordinating the care for this patient.  Greater than 50% of the time was spent in direct face-to-face contact.

## 2023-10-20 ENCOUNTER — Ambulatory Visit: Attending: Obstetrics and Gynecology

## 2023-10-20 ENCOUNTER — Other Ambulatory Visit: Payer: Self-pay | Admitting: *Deleted

## 2023-10-20 ENCOUNTER — Ambulatory Visit: Admitting: Obstetrics and Gynecology

## 2023-10-20 VITALS — BP 116/65 | HR 96

## 2023-10-20 DIAGNOSIS — O99213 Obesity complicating pregnancy, third trimester: Secondary | ICD-10-CM

## 2023-10-20 DIAGNOSIS — Z3480 Encounter for supervision of other normal pregnancy, unspecified trimester: Secondary | ICD-10-CM | POA: Diagnosis present

## 2023-10-20 DIAGNOSIS — E66813 Obesity, class 3: Secondary | ICD-10-CM | POA: Diagnosis not present

## 2023-10-20 DIAGNOSIS — Z3A35 35 weeks gestation of pregnancy: Secondary | ICD-10-CM | POA: Diagnosis present

## 2023-10-20 DIAGNOSIS — E669 Obesity, unspecified: Secondary | ICD-10-CM | POA: Diagnosis not present

## 2023-10-20 DIAGNOSIS — Z362 Encounter for other antenatal screening follow-up: Secondary | ICD-10-CM | POA: Insufficient documentation

## 2023-10-20 NOTE — Progress Notes (Signed)
 Maternal-Fetal Medicine Consultation Name: Jamie [name] MRN: [id number]  G5 P3013 at 35w 4d gestation.  Patient is here for antenatal testing. Pregravid BMI of 40.  Patient does not have gestational diabetes.  Blood pressure today at our office is 116/65 mmHg.  Ultrasound Amniotic fluid is normal good fetal activity seen.  Cephalic presentation.  Antenatal testing is reassuring.  BPP 8/8. I reassured the patient of the findings.  Pregravid BMI Grade 3 obesity is independently associated with increased risk of stillbirth (2.5- to 3-fold), but the absolute risk is very small.  I discussed protocol of weekly antenatal testing from [redacted] weeks gestation until delivery. I counseled the patient that reassuring antenatal testing is associated with good outcomes after stillbirth risk of less than 1 per 1,000 births.  Recommendations - Patient has an appointment for BPP next week. - NST in 2 weeks.  Consultation including face-to-face (more than 50%) counseling 10 minutes.

## 2023-10-22 ENCOUNTER — Ambulatory Visit (HOSPITAL_COMMUNITY)
Admission: RE | Admit: 2023-10-22 | Discharge: 2023-10-22 | Disposition: A | Discharge status: Home / Self Care | Source: Ambulatory Visit | Attending: Cardiology | Admitting: Cardiology

## 2023-10-22 DIAGNOSIS — R0789 Other chest pain: Secondary | ICD-10-CM

## 2023-10-22 LAB — ECHOCARDIOGRAM COMPLETE
AR max vel: 2.26 cm2
AV Area VTI: 2.36 cm2
AV Area mean vel: 2.23 cm2
AV Mean grad: 5 mmHg
AV Peak grad: 9.5 mmHg
Ao pk vel: 1.54 m/s
Area-P 1/2: 5.2 cm2
MV M vel: 0.94 m/s
MV Peak grad: 3.5 mmHg
S' Lateral: 2.98 cm

## 2023-10-23 ENCOUNTER — Ambulatory Visit: Payer: Self-pay | Admitting: Cardiology

## 2023-10-27 ENCOUNTER — Ambulatory Visit: Admitting: Obstetrics

## 2023-10-27 ENCOUNTER — Other Ambulatory Visit: Payer: Self-pay | Admitting: *Deleted

## 2023-10-27 ENCOUNTER — Ambulatory Visit: Attending: Obstetrics and Gynecology

## 2023-10-27 VITALS — BP 117/72 | HR 88

## 2023-10-27 DIAGNOSIS — Z3A36 36 weeks gestation of pregnancy: Secondary | ICD-10-CM | POA: Insufficient documentation

## 2023-10-27 DIAGNOSIS — O99213 Obesity complicating pregnancy, third trimester: Secondary | ICD-10-CM | POA: Insufficient documentation

## 2023-10-27 DIAGNOSIS — E669 Obesity, unspecified: Secondary | ICD-10-CM | POA: Diagnosis not present

## 2023-10-27 DIAGNOSIS — Z362 Encounter for other antenatal screening follow-up: Secondary | ICD-10-CM | POA: Diagnosis not present

## 2023-10-27 DIAGNOSIS — Z3480 Encounter for supervision of other normal pregnancy, unspecified trimester: Secondary | ICD-10-CM

## 2023-10-27 NOTE — Progress Notes (Signed)
 MFM Note  Jamie [name] is currently at 36 weeks and 4 days.  Jamie [name] has been followed due to maternal obesity with a BMI of 40.    There was normal amniotic fluid noted with a total AFI of 10.59 cm.  A BPP performed today was 8 out of 8.    The fetus was in the vertex presentation.  Due to maternal obesity, Jamie [name] will return in 1 week for an NST.

## 2023-10-28 ENCOUNTER — Encounter: Payer: Self-pay | Admitting: Obstetrics and Gynecology

## 2023-10-28 ENCOUNTER — Other Ambulatory Visit (HOSPITAL_COMMUNITY)
Admission: RE | Admit: 2023-10-28 | Discharge: 2023-10-28 | Disposition: A | Discharge status: Home / Self Care | Source: Ambulatory Visit | Attending: Obstetrics and Gynecology | Admitting: Obstetrics and Gynecology

## 2023-10-28 ENCOUNTER — Ambulatory Visit (INDEPENDENT_AMBULATORY_CARE_PROVIDER_SITE_OTHER): Admitting: Obstetrics and Gynecology

## 2023-10-28 VITALS — BP 117/74 | HR 87 | Wt 285.0 lb

## 2023-10-28 DIAGNOSIS — Z3493 Encounter for supervision of normal pregnancy, unspecified, third trimester: Secondary | ICD-10-CM | POA: Diagnosis present

## 2023-10-28 DIAGNOSIS — Z3A36 36 weeks gestation of pregnancy: Secondary | ICD-10-CM

## 2023-10-28 DIAGNOSIS — O99213 Obesity complicating pregnancy, third trimester: Secondary | ICD-10-CM

## 2023-10-28 DIAGNOSIS — Z3480 Encounter for supervision of other normal pregnancy, unspecified trimester: Secondary | ICD-10-CM

## 2023-10-28 NOTE — Progress Notes (Signed)
   PRENATAL VISIT NOTE  Subjective:  Jamie [name] is a 33 y.o. [id number] at [redacted]w[redacted]d being seen today for ongoing prenatal care.  She is currently monitored for the following issues for this low-risk pregnancy and has BMI 40.0-44.9, adult (HCC); Supervision of other normal pregnancy, antepartum; and Obesity affecting pregnancy on their problem list.  Patient reports continued chest heaviness.  Contractions: Not present. Vag. Bleeding: None.  Movement: Present. Denies leaking of fluid.   The following portions of the patient's history were reviewed and updated as appropriate: allergies, current medications, past family history, past medical history, past social history, past surgical history and problem list.   Objective:    Vitals:   10/28/23 0927  BP: 117/74  Pulse: 87  Weight: 129.3 kg    Fetal Status:  Fetal Heart Rate (bpm): 145   Movement: Present    General: Alert, oriented and cooperative. Patient is in no acute distress.  Skin: Skin is warm and dry. No rash noted.   Cardiovascular: Normal heart rate noted  Respiratory: Normal respiratory effort, no problems with respiration noted  Abdomen: Soft, gravid, appropriate for gestational age.  Pain/Pressure: Present     Pelvic: Cervical exam deferred        Extremities: Normal range of motion.  Edema: None  Mental Status: Normal mood and affect. Normal behavior. Normal judgment and thought content.   Assessment and Plan:  Pregnancy: [id number] at [redacted]w[redacted]d 1. Supervision of other normal pregnancy, antepartum (Primary) -baby moving well, BP WNL -still reporting some chest heaviness that remains unchanged. Notices it the most if laying flat, feels like baby is pushing up. Saw OB Cards 5/22, wore home monitor for a week & had normal echo 6/19- told to follow up pp. Visit scheduled for 7/30. Return precautions discussed  2. [redacted] weeks gestation of pregnancy - Culture, beta strep (group b only) - Cervicovaginal ancillary only( [facility])  3. Obesity affecting pregnancy in third trimester, unspecified obesity type -BPP next week with MFM on 7/1. Last growth on 6/12 EFW 2661g 62%   Term labor symptoms and general obstetric precautions including but not limited to vaginal bleeding, contractions, leaking of fluid and fetal movement were reviewed in detail with the patient. Please refer to After Visit Summary for other counseling recommendations.   Follow up in 1 week  Future Appointments  Date Time Provider Department Center  11/03/2023  9:30 AM [facility] PROVIDER 1 [facility]  11/03/2023  9:45 AM [facility] NST [facility]  12/02/2023  8:40 AM Tobb, Kardie, DO [facility] H&V  12/11/2023 10:20 AM Edman Marsa PARAS, DO [facility]  04/05/2024  8:30 AM Alm Delon SAILOR, DO [facility] None      [name], [facility] 10/28/23

## 2023-10-28 NOTE — Progress Notes (Signed)
 Pt presents for ROB visit. No concerns

## 2023-10-29 LAB — CERVICOVAGINAL ANCILLARY ONLY
Chlamydia: NEGATIVE
Comment: NEGATIVE
Comment: NEGATIVE
Comment: NORMAL
Neisseria Gonorrhea: NEGATIVE
Trichomonas: NEGATIVE

## 2023-10-30 ENCOUNTER — Ambulatory Visit: Payer: Self-pay | Admitting: Obstetrics and Gynecology

## 2023-11-02 LAB — CULTURE, BETA STREP (GROUP B ONLY): Strep Gp B Culture: NEGATIVE

## 2023-11-03 ENCOUNTER — Ambulatory Visit (INDEPENDENT_AMBULATORY_CARE_PROVIDER_SITE_OTHER): Admitting: Obstetrics

## 2023-11-03 ENCOUNTER — Ambulatory Visit: Attending: Obstetrics and Gynecology | Admitting: Obstetrics and Gynecology

## 2023-11-03 ENCOUNTER — Other Ambulatory Visit

## 2023-11-03 ENCOUNTER — Encounter: Payer: Self-pay | Admitting: Obstetrics

## 2023-11-03 ENCOUNTER — Ambulatory Visit

## 2023-11-03 VITALS — BP 115/67 | HR 85

## 2023-11-03 VITALS — BP 120/70 | HR 91 | Wt 289.5 lb

## 2023-11-03 DIAGNOSIS — O3663X0 Maternal care for excessive fetal growth, third trimester, not applicable or unspecified: Secondary | ICD-10-CM | POA: Diagnosis not present

## 2023-11-03 DIAGNOSIS — O99213 Obesity complicating pregnancy, third trimester: Secondary | ICD-10-CM | POA: Diagnosis not present

## 2023-11-03 DIAGNOSIS — Z3480 Encounter for supervision of other normal pregnancy, unspecified trimester: Secondary | ICD-10-CM | POA: Diagnosis not present

## 2023-11-03 DIAGNOSIS — O0993 Supervision of high risk pregnancy, unspecified, third trimester: Secondary | ICD-10-CM | POA: Diagnosis not present

## 2023-11-03 DIAGNOSIS — Z3A37 37 weeks gestation of pregnancy: Secondary | ICD-10-CM | POA: Diagnosis not present

## 2023-11-03 NOTE — Procedures (Signed)
 Jamie [name] [birth year] [redacted]w[redacted]d  Fetus A Non-Stress Test Interpretation for 11/03/23  Indication: Obesity - NST only  Fetal Heart Rate A Mode: External Baseline Rate (A): 145 bpm Variability: Moderate Accelerations: 15 x 15 Decelerations: None Multiple birth?: No  Uterine Activity Mode: Palpation, Toco Contraction Frequency (min): one ctx noted Contraction Duration (sec): 80 Resting Tone Palpated: Relaxed  Interpretation (Fetal Testing) Nonstress Test Interpretation: Reactive Comments: Reviewed with Dr. Arna

## 2023-11-03 NOTE — Progress Notes (Signed)
 Subjective:  Jamie [name] is a 33 y.o. H4E6986 at [redacted]w[redacted]d being seen today for ongoing prenatal care.  She is currently monitored for the following issues for this low-risk pregnancy and has BMI 40.0-44.9, adult (HCC); Supervision of other normal pregnancy, antepartum; and Obesity affecting pregnancy on their problem list.  Patient reports heartburn.  Contractions: Irregular. Vag. Bleeding: None.  Movement: Present. Denies leaking of fluid.   The following portions of the patient's history were reviewed and updated as appropriate: allergies, current medications, past family history, past medical history, past social history, past surgical history and problem list. Problem list updated.  Objective:   Vitals:   11/03/23 1121  BP: 120/70  Pulse: 91  Weight: 289 lb 8 oz (131.3 kg)    Fetal Status: Fetal Heart Rate (bpm): 139   Movement: Present     General:  Alert, oriented and cooperative. Patient is in no acute distress.  Skin: Skin is warm and dry. No rash noted.   Cardiovascular: Normal heart rate noted  Respiratory: Normal respiratory effort, no problems with respiration noted  Abdomen: Soft, gravid, appropriate for gestational age. Pain/Pressure: Absent     Pelvic:  Cervical exam deferred        Extremities: Normal range of motion.  Edema: None  Mental Status: Normal mood and affect. Normal behavior. Normal judgment and thought content.   Urinalysis:      Assessment and Plan:  Pregnancy: H4E6986 at [redacted]w[redacted]d  1. Supervision of high risk pregnancy in third trimester (Primary)  2. Excessive fetal growth affecting management of pregnancy in third trimester, single or unspecified fetus  3. Obesity affecting pregnancy in third trimester, unspecified obesity type    Term labor symptoms and general obstetric precautions including but not limited to vaginal bleeding, contractions, leaking of fluid and fetal movement were reviewed in detail with the patient. Please refer to After Visit  Summary for other counseling recommendations.   Return in about 1 week (around 11/10/2023) for ROB.   Rudy Carlin LABOR, MD 11/03/2023

## 2023-11-03 NOTE — Progress Notes (Signed)
 After review, MFM consult with provider is not indicated for today  Jamie Ranks, MD 11/03/2023 5:26 PM  [facility]

## 2023-11-03 NOTE — Progress Notes (Signed)
 Pt presents for ROB. Pt has no questions or concerns for today's visit.

## 2023-11-10 ENCOUNTER — Other Ambulatory Visit

## 2023-11-10 ENCOUNTER — Ambulatory Visit

## 2023-11-12 ENCOUNTER — Encounter: Payer: Self-pay | Admitting: Obstetrics

## 2023-11-12 ENCOUNTER — Ambulatory Visit: Admitting: Obstetrics

## 2023-11-12 ENCOUNTER — Ambulatory Visit: Attending: Maternal & Fetal Medicine

## 2023-11-12 VITALS — BP 126/65

## 2023-11-12 VITALS — BP 129/80 | HR 91 | Wt 291.3 lb

## 2023-11-12 DIAGNOSIS — Z3A38 38 weeks gestation of pregnancy: Secondary | ICD-10-CM

## 2023-11-12 DIAGNOSIS — O0993 Supervision of high risk pregnancy, unspecified, third trimester: Secondary | ICD-10-CM | POA: Diagnosis not present

## 2023-11-12 DIAGNOSIS — O99213 Obesity complicating pregnancy, third trimester: Secondary | ICD-10-CM

## 2023-11-12 DIAGNOSIS — D508 Other iron deficiency anemias: Secondary | ICD-10-CM | POA: Diagnosis not present

## 2023-11-12 DIAGNOSIS — E669 Obesity, unspecified: Secondary | ICD-10-CM

## 2023-11-12 DIAGNOSIS — O3663X0 Maternal care for excessive fetal growth, third trimester, not applicable or unspecified: Secondary | ICD-10-CM

## 2023-11-12 NOTE — Progress Notes (Signed)
 MFM Consult Note  Jamie [name] is currently at 38 weeks and 6 days.  She has been followed due to maternal obesity with a BMI of 40.  She denies any problems since her last exam.  On today's exam, the overall EFW of 8 pounds 5 ounces measures at the 79th percentile for her gestational age.    There was normal amniotic fluid noted with a total AFI of 11.82 cm.  A BPP performed today was 8 out of 8.    The fetus was in the vertex presentation.  Due to maternal obesity, delivery should be considered at between 39 to 40 weeks (within the next week).    The patient stated that all of her questions were answered today.  No further exams were scheduled in our office.  A total of 10 minutes was spent counseling and coordinating the care for this patient.  Greater than 50% of the time was spent in direct face-to-face contact.

## 2023-11-12 NOTE — Progress Notes (Signed)
 Subjective:  Jamie [name] is a 33 y.o. H4E6986 at [redacted]w[redacted]d being seen today for ongoing prenatal care.  She is currently monitored for the following issues for this high-risk pregnancy and has BMI 40.0-44.9, adult (HCC); Supervision of other normal pregnancy, antepartum; and Obesity affecting pregnancy on their problem list.  Patient reports occasional contractions.  Contractions: Irritability. Vag. Bleeding: None.  Movement: Present. Denies leaking of fluid.   The following portions of the patient's history were reviewed and updated as appropriate: allergies, current medications, past family history, past medical history, past social history, past surgical history and problem list. Problem list updated.  Objective:   Vitals:   11/12/23 1322  BP: 129/80  Pulse: 91  Weight: 291 lb 4.8 oz (132.1 kg)    Fetal Status:     Movement: Present     General:  Alert, oriented and cooperative. Patient is in no acute distress.  Skin: Skin is warm and dry. No rash noted.   Cardiovascular: Normal heart rate noted  Respiratory: Normal respiratory effort, no problems with respiration noted  Abdomen: Soft, gravid, appropriate for gestational age. Pain/Pressure: Present     Pelvic:  Cervical exam deferred        Extremities: Normal range of motion.  Edema: None  Mental Status: Normal mood and affect. Normal behavior. Normal judgment and thought content.   Urinalysis:      ULTRASOUND:   US  MFM OB FOLLOW UP (Accession [id number]) (Order [id number]) Imaging Date: 11/12/2023 Department: [facility] Released By: Jillene Marcos CROME Authorizing: William Glenn, DO   Exam Status  Status  Final [99]   PACS Intelerad Image Link   Show images for US  MFM OB FOLLOW UP Related Results          US  MFM FETAL BPP WO NON STRESS Final result 11/12/2023       ----------------------------------------------------------------------  OBSTETRICS REPORT                       (Signed  Final 11/12/2023 12:12 pm) ---------------------------------------------------------------------- Patient Info   ID #:       [id number]                          D.O.B.:  1990/10/23 (33 yrs)(F)  Name:       Jamie [name]                    Visit Date: 11/12/2023 11:31 am ---------------------------------------------------------------------- Performed By ...   Study Result  Narrative & Impression    ----------------------------------------------------------------------  OBSTETRICS REPORT                       (Signed Final 11/12/2023 12:12 pm) ---------------------------------------------------------------------- Patient Info    ID #:       [id number]                          D.O.B.:  1990-06-30 (33 yrs)(F)  Name:       Jamie [name]                    Visit Date: 11/12/2023 11:31 am ---------------------------------------------------------------------- Performed By    Attending:        Steffan Keys MD         Ref. Address:     [address]                                                             [place], [place]                                                             [address]  Performed By:     Emelia Coombs BS,       [facility]  Referred By:      [facility] ---------------------------------------------------------------------- Orders    #  Description                           Code        Ordered By  1  US  MFM FETAL BPP WO NON               76819.01    BURK SCHAIBLE     STRESS  2  US  MFM OB FOLLOW UP                   23183.98    BURK SCHAIBLE ----------------------------------------------------------------------    #  Order #                     Accession #                 Episode #  1  [id number]                   [id number]                 [id number]  2  [id number]                   [id number]                 [id number] ---------------------------------------------------------------------- Indications    Obesity complicating pregnancy, third          N00.786  trimester (BMI 40)  [redacted] weeks gestation of pregnancy                Z3A.38  Encounter for other antenatal screening        Z36.2  follow-up  Low Risk NIPS   Neg Horizon / 2hr GTT WNL ---------------------------------------------------------------------- Fetal Evaluation    Num Of Fetuses:         1  Fetal Heart Rate(bpm):  150  Cardiac Activity:       Observed  Presentation:           Cephalic  Placenta:               Anterior  P. Cord Insertion:      Previously seen    Amniotic Fluid  AFI FV:      Within normal limits    AFI Sum(cm)     %Tile       Largest Pocket(cm)  11.82           42          3.81    RUQ(cm)       RLQ(cm)       LUQ(cm)        LLQ(cm)  2.98          2.59          2.44           3.81 ---------------------------------------------------------------------- Biophysical Evaluation    Amniotic F.V:   Within normal limits       F. Tone:        Observed  F. Movement:    Observed                   Score:          8/8  F. Breathing:   Observed ---------------------------------------------------------------------- Biometry    BPD:      93.3  mm     G. Age:  38w 0d         58  %    CI:        77.41   %    70 - 86                                                          FL/HC:      22.3   %    20.6 - 23.4  HC:      335.7  mm     G. Age:  38w 3d         27  %    HC/AC:      0.92        0.87 - 1.06  AC:      364.2  mm     G. Age:  40w 2d         95  %    FL/BPD:     80.1   %    71 - 87  FL:       74.7  mm     G. Age:  38w 1d         40  %    FL/AC:      20.5   %    20 - 24  LV:  3.4  mm    Est. FW:    3763  gm      8 lb 5 oz     79   % ---------------------------------------------------------------------- OB History    Blood Type:   O+  Gravidity:    5         Term:   3         SAB:   1  Living:       3 ---------------------------------------------------------------------- Gestational Age    LMP:           41w 1d        Date:  01/28/23                  EDD:   11/04/23  U/S Today:     38w 5d                                        EDD:   11/21/23  Best:          38w 6d     Det. By:  U/S C R L  (04/30/23)    EDD:   11/20/23 ---------------------------------------------------------------------- Anatomy    Ventricles:            Appears normal         Stomach:                Appears normal, left                                                                        sided  Thoracic:              Appears normal         Kidneys:                Appear normal  Diaphragm:             Appears normal         Bladder:                Appears normal    Other:  Technicallly difficult due to advanced GA and maternal habitus. ---------------------------------------------------------------------- Cervix Uterus Adnexa    Cervix  Not visualized (advanced GA >24wks)    Uterus  No abnormality visualized.    Right Ovary  Within normal limits.    Left Ovary  Within normal limits.    Cul De Sac  No free fluid seen.    Adnexa  No abnormality visualized ---------------------------------------------------------------------- Comments    Jamie [name] is currently at 38 weeks and 6 days.  She has  been followed due to maternal obesity with a BMI of 40.  She  denies any problems since her last exam.  On today's exam, the overall EFW of 8 pounds 5 ounces  measures at the 79th percentile for her gestational age.  There was normal amniotic fluid noted with a total AFI of  11.82 cm.  A BPP performed today was 8 out of 8.  The fetus was in the vertex presentation.  Due to maternal obesity, delivery should be considered at  between 9  to 40 weeks (within the next week).  The patient stated that all of her questions were answered  today.  No further exams were scheduled in our office.  A total of 10 minutes was spent counseling and coordinating  the care for this patient.  Greater than 50% of the time was  spent in direct face-to-face contact.   ----------------------------------------------------------------------                   Steffan Keys, MD Electronically Signed Final Report   11/12/2023 12:12 pm ----------------------------------------------------------------------     Assessment and Plan:  Pregnancy: H4E6986 at [redacted]w[redacted]d  1. Supervision of high risk pregnancy in third trimester (Primary)  2. Excessive fetal growth affecting management of pregnancy in third trimester, single or unspecified fetus - IOL recommended by MFM  ( Dr. Keys ) at between 39-40 weeks  3. Obesity affecting pregnancy in third trimester, unspecified obesity type  4. Iron deficiency anemia secondary to inadequate dietary iron intake    Term labor symptoms and general obstetric precautions including but not limited to vaginal bleeding, contractions, leaking of fluid and fetal movement were reviewed in detail with the patient. Please refer to After Visit Summary for other counseling recommendations.   Return in about 1 week (around 11/19/2023) for ROB.   Rudy Carlin LABOR, MD 11/12/2023

## 2023-11-12 NOTE — Progress Notes (Signed)
 Pt presents for rob. Pt has no questions or concerns at this time.

## 2023-11-13 ENCOUNTER — Telehealth (HOSPITAL_COMMUNITY): Payer: Self-pay | Admitting: *Deleted

## 2023-11-13 ENCOUNTER — Encounter (HOSPITAL_COMMUNITY): Payer: Self-pay | Admitting: *Deleted

## 2023-11-13 NOTE — Telephone Encounter (Signed)
 Preadmission screen

## 2023-11-14 ENCOUNTER — Encounter (HOSPITAL_COMMUNITY): Payer: Self-pay | Admitting: Obstetrics and Gynecology

## 2023-11-14 ENCOUNTER — Inpatient Hospital Stay (HOSPITAL_COMMUNITY)
Admission: AD | Admit: 2023-11-14 | Discharge: 2023-11-14 | Disposition: A | Discharge status: Home / Self Care | Attending: Obstetrics and Gynecology | Admitting: Obstetrics and Gynecology

## 2023-11-14 ENCOUNTER — Other Ambulatory Visit: Payer: Self-pay

## 2023-11-14 DIAGNOSIS — Z0371 Encounter for suspected problem with amniotic cavity and membrane ruled out: Secondary | ICD-10-CM | POA: Diagnosis present

## 2023-11-14 DIAGNOSIS — O471 False labor at or after 37 completed weeks of gestation: Secondary | ICD-10-CM | POA: Insufficient documentation

## 2023-11-14 DIAGNOSIS — O479 False labor, unspecified: Secondary | ICD-10-CM

## 2023-11-14 DIAGNOSIS — Z3A39 39 weeks gestation of pregnancy: Secondary | ICD-10-CM | POA: Diagnosis not present

## 2023-11-14 LAB — RUPTURE OF MEMBRANE (ROM)PLUS: Rom Plus: NEGATIVE

## 2023-11-14 LAB — POCT FERN TEST: POCT Fern Test: NEGATIVE

## 2023-11-14 NOTE — MAU Note (Signed)
.  Jamie [name] is a 33 y.o. at [redacted]w[redacted]d here in MAU reporting: water  leaking from her vagina around 1200 patient is unable to describe fluid. Patient reports a small trickle since. Denies pain   Onset of complaint: today  Pain score: denies  There were no vitals filed for this visit.   FHT: Lab orders placed from triage:   none

## 2023-11-14 NOTE — MAU Provider Note (Signed)
 S: [name] is a 33 y.o. [phone] at [redacted]w[redacted]d who presents to MAU today for labor evaluation. Was also unsure if she ruptured.  Cervical exam by RN:  Dilation: 2 Effacement (%): 50 Station: -2 Presentation: Vertex Exam by:: J.Bellamy,RN  Fetal Monitoring: Baseline: 140 Variability: moderate Accelerations: present Decelerations: absent Contractions: irregular  MDM Discussed patient with RN. NST reviewed. Fern and ROM plus negative. Offered staying for IOL given MFM recommendation for IOL for obesity between 39-40 weeks. She declines today and prefers to keep scheduled induction on 7/16.  A: SIUP at [redacted]w[redacted]d  False labor  P: Discharge home Labor precautions and kick counts included in AVS Patient to follow-up with primary OB as scheduled  Patient may return to MAU as needed or when in labor   Nicholaus Almarie HERO, MD 11/14/2023 4:33 PM

## 2023-11-14 NOTE — MAU Note (Signed)
 Per Delon Emms, NP request to see if first call L&D would admit pt based on the recommendations of MFM from 11/12/23 to be delivered between 39-40 weeks for obesity.  Dr. Nicholaus consulted and agreed that if pt wanted to be augmented then she could be admitted.  Notified pt provider's recommendation.  Pt stated that she does not want to be admitted unless her water  is confirmed broken as she would be prefer that she was further dilated.  Camie, CNM notified of pt's decision.    Hero Mccathern,RN

## 2023-11-18 ENCOUNTER — Inpatient Hospital Stay (HOSPITAL_COMMUNITY): Admitting: Anesthesiology

## 2023-11-18 ENCOUNTER — Inpatient Hospital Stay (HOSPITAL_COMMUNITY)

## 2023-11-18 ENCOUNTER — Other Ambulatory Visit: Payer: Self-pay

## 2023-11-18 ENCOUNTER — Encounter (HOSPITAL_COMMUNITY)
Admission: AD | Disposition: A | Discharge status: Home / Self Care | Payer: Self-pay | Source: Home / Self Care | Attending: Obstetrics and Gynecology

## 2023-11-18 ENCOUNTER — Inpatient Hospital Stay (HOSPITAL_COMMUNITY)
Admission: AD | Admit: 2023-11-18 | Discharge: 2023-11-20 | DRG: 798 | Disposition: A | Discharge status: Home / Self Care | Attending: Family Medicine | Admitting: Family Medicine

## 2023-11-18 ENCOUNTER — Encounter (HOSPITAL_COMMUNITY): Payer: Self-pay | Admitting: Obstetrics and Gynecology

## 2023-11-18 DIAGNOSIS — Z302 Encounter for sterilization: Secondary | ICD-10-CM

## 2023-11-18 DIAGNOSIS — Z6841 Body Mass Index (BMI) 40.0 and over, adult: Secondary | ICD-10-CM | POA: Diagnosis not present

## 2023-11-18 DIAGNOSIS — Z833 Family history of diabetes mellitus: Secondary | ICD-10-CM | POA: Diagnosis not present

## 2023-11-18 DIAGNOSIS — Z8249 Family history of ischemic heart disease and other diseases of the circulatory system: Secondary | ICD-10-CM | POA: Diagnosis not present

## 2023-11-18 DIAGNOSIS — Z7982 Long term (current) use of aspirin: Secondary | ICD-10-CM

## 2023-11-18 DIAGNOSIS — Z3480 Encounter for supervision of other normal pregnancy, unspecified trimester: Principal | ICD-10-CM

## 2023-11-18 DIAGNOSIS — Z91013 Allergy to seafood: Secondary | ICD-10-CM | POA: Diagnosis not present

## 2023-11-18 DIAGNOSIS — O9902 Anemia complicating childbirth: Secondary | ICD-10-CM | POA: Diagnosis present

## 2023-11-18 DIAGNOSIS — E66813 Obesity, class 3: Secondary | ICD-10-CM | POA: Diagnosis present

## 2023-11-18 DIAGNOSIS — Z3490 Encounter for supervision of normal pregnancy, unspecified, unspecified trimester: Secondary | ICD-10-CM | POA: Diagnosis present

## 2023-11-18 DIAGNOSIS — O99214 Obesity complicating childbirth: Secondary | ICD-10-CM | POA: Diagnosis present

## 2023-11-18 DIAGNOSIS — Z3A39 39 weeks gestation of pregnancy: Secondary | ICD-10-CM | POA: Diagnosis not present

## 2023-11-18 DIAGNOSIS — O326XX0 Maternal care for compound presentation, not applicable or unspecified: Secondary | ICD-10-CM | POA: Diagnosis not present

## 2023-11-18 DIAGNOSIS — J45909 Unspecified asthma, uncomplicated: Secondary | ICD-10-CM | POA: Diagnosis not present

## 2023-11-18 HISTORY — PX: TUBAL LIGATION: SHX77

## 2023-11-18 LAB — CBC
HCT: 32.9 % — ABNORMAL LOW (ref 36.0–46.0)
Hemoglobin: 10.8 g/dL — ABNORMAL LOW (ref 12.0–15.0)
MCH: 28.9 pg (ref 26.0–34.0)
MCHC: 32.8 g/dL (ref 30.0–36.0)
MCV: 88 fL (ref 80.0–100.0)
Platelets: 274 K/uL (ref 150–400)
RBC: 3.74 MIL/uL — ABNORMAL LOW (ref 3.87–5.11)
RDW: 14 % (ref 11.5–15.5)
WBC: 7.3 K/uL (ref 4.0–10.5)
nRBC: 0 % (ref 0.0–0.2)

## 2023-11-18 LAB — TYPE AND SCREEN
ABO/RH(D): O POS
Antibody Screen: NEGATIVE

## 2023-11-18 LAB — RPR: RPR Ser Ql: NONREACTIVE

## 2023-11-18 SURGERY — LIGATION, FALLOPIAN TUBE, POSTPARTUM
Anesthesia: Epidural | Site: Abdomen

## 2023-11-18 MED ORDER — FENTANYL CITRATE (PF) 100 MCG/2ML IJ SOLN: 50.0000 ug | INTRAMUSCULAR | Status: DC | PRN

## 2023-11-18 MED ORDER — COCONUT OIL OIL
1.0000 | TOPICAL_OIL | Status: DC | PRN
Start: 2023-11-18 — End: 2023-11-20

## 2023-11-18 MED ORDER — EPHEDRINE 5 MG/ML INJ: 10.0000 mg | INTRAVENOUS | Status: DC | PRN

## 2023-11-18 MED ORDER — LACTATED RINGERS IV SOLN: 500.0000 mL | Freq: Once | INTRAVENOUS | Status: DC

## 2023-11-18 MED ORDER — FENTANYL-BUPIVACAINE-NACL 0.5-0.125-0.9 MG/250ML-% EP SOLN
12.0000 mL/h | EPIDURAL | Status: DC | PRN
  Administered 2023-11-18: 12 mL/h via EPIDURAL
  Filled 2023-11-18: qty 250

## 2023-11-18 MED ORDER — DIPHENHYDRAMINE HCL 50 MG/ML IJ SOLN: 12.5000 mg | INTRAMUSCULAR | Status: DC | PRN

## 2023-11-18 MED ORDER — LIDOCAINE-EPINEPHRINE (PF) 2 %-1:200000 IJ SOLN
INTRAMUSCULAR | Status: DC | PRN
Start: 2023-11-18 — End: 2023-11-18

## 2023-11-18 MED ORDER — ONDANSETRON HCL 4 MG/2ML IJ SOLN
INTRAMUSCULAR | Status: DC | PRN
  Administered 2023-11-18: 4 mg via INTRAVENOUS

## 2023-11-18 MED ORDER — ONDANSETRON HCL 4 MG/2ML IJ SOLN: 4.0000 mg | INTRAMUSCULAR | Status: DC | PRN

## 2023-11-18 MED ORDER — ZOLPIDEM TARTRATE 5 MG PO TABS: 5.0000 mg | ORAL_TABLET | Freq: Every evening | ORAL | Status: DC | PRN

## 2023-11-18 MED ORDER — DEXMEDETOMIDINE HCL IN NACL 80 MCG/20ML IV SOLN
INTRAVENOUS | Status: DC | PRN
  Administered 2023-11-18 (×2): 5 ug via INTRAVENOUS

## 2023-11-18 MED ORDER — PHENYLEPHRINE 80 MCG/ML (10ML) SYRINGE FOR IV PUSH (FOR BLOOD PRESSURE SUPPORT)
80.0000 ug | PREFILLED_SYRINGE | INTRAVENOUS | Status: DC | PRN
  Administered 2023-11-18 (×2): 80 ug via INTRAVENOUS

## 2023-11-18 MED ORDER — ACETAMINOPHEN 10 MG/ML IV SOLN
1000.0000 mg | Freq: Once | INTRAVENOUS | Status: DC | PRN
  Administered 2023-11-18: 1000 mg via INTRAVENOUS

## 2023-11-18 MED ORDER — SENNOSIDES-DOCUSATE SODIUM 8.6-50 MG PO TABS
2.0000 | ORAL_TABLET | ORAL | Status: DC
  Administered 2023-11-19 – 2023-11-20 (×2): 2 via ORAL
  Filled 2023-11-18 (×2): qty 2

## 2023-11-18 MED ORDER — OXYCODONE HCL 5 MG PO TABS: 5.0000 mg | ORAL_TABLET | Freq: Once | ORAL | Status: DC | PRN

## 2023-11-18 MED ORDER — PRENATAL MULTIVITAMIN CH
1.0000 | ORAL_TABLET | Freq: Every day | ORAL | Status: DC
  Administered 2023-11-19 – 2023-11-20 (×2): 1 via ORAL
  Filled 2023-11-18 (×2): qty 1

## 2023-11-18 MED ORDER — IBUPROFEN 600 MG PO TABS
600.0000 mg | ORAL_TABLET | Freq: Four times a day (QID) | ORAL | Status: DC
  Administered 2023-11-18 – 2023-11-20 (×8): 600 mg via ORAL
  Filled 2023-11-18 (×8): qty 1

## 2023-11-18 MED ORDER — TERBUTALINE SULFATE 1 MG/ML IJ SOLN: 0.2500 mg | Freq: Once | INTRAMUSCULAR | Status: DC | PRN

## 2023-11-18 MED ORDER — METOCLOPRAMIDE HCL 10 MG PO TABS
10.0000 mg | ORAL_TABLET | Freq: Once | ORAL | Status: AC
  Administered 2023-11-18: 10 mg via ORAL
  Filled 2023-11-18: qty 1

## 2023-11-18 MED ORDER — ONDANSETRON HCL 4 MG/2ML IJ SOLN: 4.0000 mg | Freq: Once | INTRAMUSCULAR | Status: DC | PRN

## 2023-11-18 MED ORDER — OXYCODONE-ACETAMINOPHEN 5-325 MG PO TABS: 1.0000 | ORAL_TABLET | ORAL | Status: DC | PRN

## 2023-11-18 MED ORDER — SIMETHICONE 80 MG PO CHEW: 80.0000 mg | CHEWABLE_TABLET | ORAL | Status: DC | PRN

## 2023-11-18 MED ORDER — LIDOCAINE-EPINEPHRINE (PF) 2 %-1:200000 IJ SOLN: INTRAMUSCULAR | Status: DC | PRN

## 2023-11-18 MED ORDER — FAMOTIDINE 20 MG PO TABS
40.0000 mg | ORAL_TABLET | Freq: Once | ORAL | Status: AC
  Administered 2023-11-18: 40 mg via ORAL
  Filled 2023-11-18: qty 2

## 2023-11-18 MED ORDER — FENTANYL CITRATE (PF) 100 MCG/2ML IJ SOLN: 25.0000 ug | INTRAMUSCULAR | Status: DC | PRN

## 2023-11-18 MED ORDER — SODIUM CHLORIDE 0.9% FLUSH
3.0000 mL | INTRAVENOUS | Status: DC | PRN
Start: 2023-11-18 — End: 2023-11-20

## 2023-11-18 MED ORDER — SODIUM CHLORIDE 0.9 % IV SOLN
250.0000 mL | INTRAVENOUS | Status: DC | PRN
Start: 2023-11-18 — End: 2023-11-20

## 2023-11-18 MED ORDER — FENTANYL CITRATE (PF) 100 MCG/2ML IJ SOLN
INTRAMUSCULAR | Status: DC | PRN
  Administered 2023-11-18: 100 ug via EPIDURAL

## 2023-11-18 MED ORDER — LACTATED RINGERS IV SOLN: 500.0000 mL | INTRAVENOUS | Status: DC | PRN

## 2023-11-18 MED ORDER — ACETAMINOPHEN 325 MG PO TABS
650.0000 mg | ORAL_TABLET | ORAL | Status: DC | PRN
  Administered 2023-11-18 – 2023-11-20 (×2): 650 mg via ORAL
  Filled 2023-11-18 (×2): qty 2

## 2023-11-18 MED ORDER — DIPHENHYDRAMINE HCL 25 MG PO CAPS: 25.0000 mg | ORAL_CAPSULE | Freq: Four times a day (QID) | ORAL | Status: DC | PRN

## 2023-11-18 MED ORDER — MIDAZOLAM HCL 2 MG/2ML IJ SOLN
INTRAMUSCULAR | Status: AC
Start: 2023-11-18 — End: 2023-11-18
  Filled 2023-11-18: qty 2

## 2023-11-18 MED ORDER — OXYTOCIN BOLUS FROM INFUSION
333.0000 mL | Freq: Once | INTRAVENOUS | Status: AC
Start: 2023-11-18 — End: 2023-11-18
  Administered 2023-11-18: 333 mL via INTRAVENOUS

## 2023-11-18 MED ORDER — ONDANSETRON HCL 4 MG/2ML IJ SOLN
4.0000 mg | Freq: Four times a day (QID) | INTRAMUSCULAR | Status: DC | PRN
  Administered 2023-11-18: 4 mg via INTRAVENOUS
  Filled 2023-11-18: qty 2

## 2023-11-18 MED ORDER — FENTANYL CITRATE (PF) 100 MCG/2ML IJ SOLN
INTRAMUSCULAR | Status: DC | PRN
  Administered 2023-11-18 (×2): 50 ug via INTRAVENOUS

## 2023-11-18 MED ORDER — OXYTOCIN-SODIUM CHLORIDE 30-0.9 UT/500ML-% IV SOLN
2.5000 [IU]/h | INTRAVENOUS | Status: DC
  Administered 2023-11-18: 2.5 [IU]/h via INTRAVENOUS
  Filled 2023-11-18: qty 500

## 2023-11-18 MED ORDER — PHENYLEPHRINE 80 MCG/ML (10ML) SYRINGE FOR IV PUSH (FOR BLOOD PRESSURE SUPPORT)
80.0000 ug | PREFILLED_SYRINGE | INTRAVENOUS | Status: DC | PRN
  Filled 2023-11-18: qty 10

## 2023-11-18 MED ORDER — ACETAMINOPHEN 10 MG/ML IV SOLN
INTRAVENOUS | Status: AC
  Filled 2023-11-18: qty 100

## 2023-11-18 MED ORDER — STERILE WATER FOR IRRIGATION IR SOLN
Status: DC | PRN
  Administered 2023-11-18: 1000 mL

## 2023-11-18 MED ORDER — SOD CITRATE-CITRIC ACID 500-334 MG/5ML PO SOLN: 30.0000 mL | ORAL | Status: DC | PRN

## 2023-11-18 MED ORDER — LACTATED RINGERS IV SOLN: INTRAVENOUS | Status: AC

## 2023-11-18 MED ORDER — LIDOCAINE HCL (PF) 1 % IJ SOLN
INTRAMUSCULAR | Status: DC | PRN
  Administered 2023-11-18: 8 mL via EPIDURAL

## 2023-11-18 MED ORDER — SODIUM CHLORIDE 0.9% FLUSH: 3.0000 mL | Freq: Two times a day (BID) | INTRAVENOUS | Status: DC

## 2023-11-18 MED ORDER — OXYCODONE HCL 5 MG/5ML PO SOLN: 5.0000 mg | Freq: Once | ORAL | Status: DC | PRN

## 2023-11-18 MED ORDER — MIDAZOLAM HCL 5 MG/5ML IJ SOLN
INTRAMUSCULAR | Status: DC | PRN
  Administered 2023-11-18 (×2): 1 mg via INTRAVENOUS

## 2023-11-18 MED ORDER — BENZOCAINE-MENTHOL 20-0.5 % EX AERO
1.0000 | INHALATION_SPRAY | CUTANEOUS | Status: DC | PRN
  Filled 2023-11-18: qty 56

## 2023-11-18 MED ORDER — OXYCODONE-ACETAMINOPHEN 5-325 MG PO TABS: 2.0000 | ORAL_TABLET | ORAL | Status: DC | PRN

## 2023-11-18 MED ORDER — FENTANYL CITRATE (PF) 100 MCG/2ML IJ SOLN
INTRAMUSCULAR | Status: AC
Start: 2023-11-18 — End: 2023-11-18
  Filled 2023-11-18: qty 2

## 2023-11-18 MED ORDER — FENTANYL CITRATE (PF) 100 MCG/2ML IJ SOLN
INTRAMUSCULAR | Status: AC
  Filled 2023-11-18: qty 2

## 2023-11-18 MED ORDER — OXYTOCIN-SODIUM CHLORIDE 30-0.9 UT/500ML-% IV SOLN
1.0000 m[IU]/min | INTRAVENOUS | Status: DC
  Administered 2023-11-18: 2 m[IU]/min via INTRAVENOUS

## 2023-11-18 MED ORDER — MORPHINE SULFATE (PF) 0.5 MG/ML IJ SOLN
INTRAMUSCULAR | Status: AC
  Filled 2023-11-18: qty 10

## 2023-11-18 MED ORDER — ACETAMINOPHEN 325 MG PO TABS: 650.0000 mg | ORAL_TABLET | ORAL | Status: DC | PRN

## 2023-11-18 MED ORDER — LACTATED RINGERS IV SOLN: INTRAVENOUS | Status: DC

## 2023-11-18 MED ORDER — LIDOCAINE-EPINEPHRINE (PF) 2 %-1:200000 IJ SOLN
INTRAMUSCULAR | Status: DC | PRN
  Administered 2023-11-18: 10 mL via EPIDURAL
  Administered 2023-11-18 (×2): 5 mL via EPIDURAL

## 2023-11-18 MED ORDER — WITCH HAZEL-GLYCERIN EX PADS: 1.0000 | MEDICATED_PAD | CUTANEOUS | Status: DC | PRN

## 2023-11-18 MED ORDER — LIDOCAINE HCL (PF) 1 % IJ SOLN: 30.0000 mL | INTRAMUSCULAR | Status: DC | PRN

## 2023-11-18 MED ORDER — ONDANSETRON HCL 4 MG PO TABS: 4.0000 mg | ORAL_TABLET | ORAL | Status: DC | PRN

## 2023-11-18 MED ORDER — DIBUCAINE (PERIANAL) 1 % EX OINT: 1.0000 | TOPICAL_OINTMENT | CUTANEOUS | Status: DC | PRN

## 2023-11-18 SURGICAL SUPPLY — 21 items
DRSG OPSITE POSTOP 3X4 (GAUZE/BANDAGES/DRESSINGS) ×1 IMPLANT
DRSG OPSITE POSTOP 4X8 (GAUZE/BANDAGES/DRESSINGS) IMPLANT
DURAPREP 26ML APPLICATOR (WOUND CARE) ×1 IMPLANT
GLOVE BIOGEL PI IND STRL 7.0 (GLOVE) ×2 IMPLANT
GLOVE SURG SS PI 7.0 STRL IVOR (GLOVE) ×1 IMPLANT
GOWN STRL REUS W/TWL LRG LVL3 (GOWN DISPOSABLE) ×1 IMPLANT
LIGASURE IMPACT 36 18CM CVD LR (INSTRUMENTS) IMPLANT
MAT PREVALON FULL STRYKER (MISCELLANEOUS) IMPLANT
NEEDLE HYPO 22GX1.5 SAFETY (NEEDLE) ×1 IMPLANT
NS IRRIG 1000ML POUR BTL (IV SOLUTION) ×1 IMPLANT
PACK ABDOMINAL MINOR (CUSTOM PROCEDURE TRAY) ×1 IMPLANT
PROTECTOR NERVE ULNAR (MISCELLANEOUS) ×1 IMPLANT
SPONGE GAUZE 2X2 8PLY STRL LF (GAUZE/BANDAGES/DRESSINGS) ×1 IMPLANT
SPONGE LAP 4X18 RFD (DISPOSABLE) ×1 IMPLANT
SUT MON AB 2-0 CT1 36 (SUTURE) IMPLANT
SUT PLAIN 0 NONE (SUTURE) IMPLANT
SUT VIC AB 3-0 PS2 18 (SUTURE) ×1 IMPLANT
SUT VICRYL 0 UR6 27IN ABS (SUTURE) ×1 IMPLANT
SYR CONTROL 10ML LL (SYRINGE) ×1 IMPLANT
TOWEL OR 17X24 6PK STRL BLUE (TOWEL DISPOSABLE) ×1 IMPLANT
WATER STERILE IRR 1000ML POUR (IV SOLUTION) ×1 IMPLANT

## 2023-11-18 NOTE — Transfer of Care (Signed)
 Immediate Anesthesia Transfer of Care Note  Patient: Jamie [name]  Procedure(s) Performed: LIGATION, FALLOPIAN TUBE, POSTPARTUM (Abdomen)  Patient Location: PACU  Anesthesia Type:Epidural  Level of Consciousness: awake, alert , and oriented  Airway & Oxygen Therapy: Patient Spontanous Breathing  Post-op Assessment: Report given to RN and Post -op Vital signs reviewed and stable  Post vital signs: Reviewed and stable  Last Vitals:  Vitals Value Taken Time  BP 95/50 11/18/23 15:30  Temp 36.5 C 11/18/23 15:18  Pulse 77 11/18/23 15:36  Resp 5 11/18/23 15:36  SpO2 94 % 11/18/23 15:36  Vitals shown include unfiled device data.  Last Pain:  Vitals:   11/18/23 1521  TempSrc:   PainSc: 0-No pain         Complications: No notable events documented.

## 2023-11-18 NOTE — Anesthesia Procedure Notes (Signed)
 Epidural Patient location during procedure: OB Start time: 11/18/2023 2:17 AM End time: 11/18/2023 2:27 AM  Staffing Anesthesiologist: Merla Almarie HERO, DO Performed: anesthesiologist   Preanesthetic Checklist Completed: patient identified, IV checked, risks and benefits discussed, monitors and equipment checked, pre-op evaluation and timeout performed  Epidural Patient position: sitting Prep: DuraPrep and site prepped and draped Patient monitoring: continuous pulse ox, blood pressure, heart rate and cardiac monitor Approach: midline Location: L3-L4 Injection technique: LOR air  Needle:  Needle type: Tuohy  Needle gauge: 17 G Needle length: 9 cm Needle insertion depth: 8 cm Catheter type: closed end flexible Catheter size: 19 Gauge Catheter at skin depth: 13 cm Test dose: negative  Assessment Sensory level: T8 Events: blood not aspirated, no cerebrospinal fluid, injection not painful, no injection resistance, no paresthesia and negative IV test  Additional Notes Patient identified. Risks/Benefits/Options discussed with patient including but not limited to bleeding, infection, nerve damage, paralysis, failed block, incomplete pain control, headache, blood pressure changes, nausea, vomiting, reactions to medication both or allergic, itching and postpartum back pain. Confirmed with bedside nurse the patient's most recent platelet count. Confirmed with patient that they are not currently taking any anticoagulation, have any bleeding history or any family history of bleeding disorders. Patient expressed understanding and wished to proceed. All questions were answered. Sterile technique was used throughout the entire procedure. Please see nursing notes for vital signs. Test dose was given through epidural catheter and negative prior to continuing to dose epidural or start infusion. Warning signs of high block given to the patient including shortness of breath, tingling/numbness in  hands, complete motor block, or any concerning symptoms with instructions to call for help. Patient was given instructions on fall risk and not to get out of bed. All questions and concerns addressed with instructions to call with any issues or inadequate analgesia.  Reason for block:procedure for pain

## 2023-11-18 NOTE — Progress Notes (Signed)
Patient desires permanent sterilization.  Other reversible forms of contraception were discussed with patient; she declines all other modalities. Risks of procedure discussed with patient including but not limited to: risk of regret, permanence of method, bleeding, infection, injury to surrounding organs and need for additional procedures.  Failure risk of about 1% with increased risk of ectopic gestation if pregnancy occurs was also discussed with patient.  Also discussed possibility of post-tubal pain syndrome. The patient concurred with the proposed plan, giving informed written consent for the procedures.  Patient has been NPO since last night she will remain NPO for procedure. Anesthesia and OR aware.  

## 2023-11-18 NOTE — Anesthesia Preprocedure Evaluation (Signed)
 Anesthesia Evaluation  Patient identified by MRN, date of birth, ID band Patient awake    Reviewed: Allergy & Precautions, Patient's Chart, lab work & pertinent test results  Airway Mallampati: III  TM Distance: >3 FB Neck ROM: Full    Dental no notable dental hx.    Pulmonary asthma    Pulmonary exam normal breath sounds clear to auscultation       Cardiovascular negative cardio ROS Normal cardiovascular exam Rhythm:Regular Rate:Normal     Neuro/Psych negative neurological ROS  negative psych ROS   GI/Hepatic negative GI ROS, Neg liver ROS,,,  Endo/Other    Class 3 obesity (BMI 41)  Renal/GU negative Renal ROS  negative genitourinary   Musculoskeletal negative musculoskeletal ROS (+)    Abdominal  (+) + obese  Peds negative pediatric ROS (+)  Hematology  (+) Blood dyscrasia, anemia Hb 10.8, plt 274   Anesthesia Other Findings   Reproductive/Obstetrics (+) Pregnancy                              Anesthesia Physical Anesthesia Plan  ASA: 3  Anesthesia Plan: Epidural   Post-op Pain Management:    Induction:   PONV Risk Score and Plan: 2  Airway Management Planned: Natural Airway  Additional Equipment: None  Intra-op Plan:   Post-operative Plan:   Informed Consent: I have reviewed the patients History and Physical, chart, labs and discussed the procedure including the risks, benefits and alternatives for the proposed anesthesia with the patient or authorized representative who has indicated his/her understanding and acceptance.       Plan Discussed with:   Anesthesia Plan Comments:         Anesthesia Quick Evaluation

## 2023-11-18 NOTE — Discharge Summary (Signed)
 Postpartum Discharge Summary  Date of Service updated***     Patient Name: Jamie [name] DOB: [birth year] MRN: [id number]  Date of admission: 11/18/2023 Delivery date:11/18/2023 Delivering provider: CHUBB, CASEY C Date of discharge: 11/18/2023  Admitting diagnosis: Encounter for induction of labor [Z34.90] Intrauterine pregnancy: [redacted]w[redacted]d     Secondary diagnosis:  Principal Problem:   Encounter for induction of labor  Additional problems: ***    Discharge diagnosis: Term Pregnancy Delivered                                              Post partum procedures:{Postpartum procedures:23558} Augmentation: AROM and Pitocin  Complications: None  Hospital course: Induction of Labor With Vaginal Delivery   33 y.o. yo H4E6986 at [redacted]w[redacted]d was admitted to the hospital 11/18/2023 for induction of labor.  Indication for induction: BMI.  Patient had an labor course that was uncomplicated. Membrane Rupture Time/Date: 4:03 AM,11/18/2023  Delivery Method:Vaginal, Spontaneous Operative Delivery:N/A Episiotomy: None Lacerations:  2nd degree;Perineal Details of delivery can be found in separate delivery note.  Patient had a postpartum course complicated by***. Patient is discharged home 11/18/23.  Newborn Data: Birth date:11/18/2023 Birth time:9:29 AM Gender:Female Living status:Living Apgars:9 ,9  Weight:   Magnesium Sulfate received: {Mag received:30440022} BMZ received: No Rhophylac:N/A MMR:N/A T-DaP:{Tdap:23962} Flu: No RSV Vaccine received: No Transfusion:{Transfusion received:30440034}  Immunizations received: Immunization History  Administered Date(s) Administered   Hepatitis B, ADULT 12/27/2013, 02/02/2015   Tdap 05/22/2013, 01/06/2017, 01/31/2022    Physical exam  Vitals:   11/18/23 0830 11/18/23 0930 11/18/23 0937 11/18/23 0948  BP: (!) 105/58 107/66 (!) 105/48 (!) 98/48  Pulse: (!) 101 100 90 88  Resp:      Temp:      TempSrc:      SpO2:      Weight:      Height:        General: {Exam; general:21111117} Lochia: {Desc; appropriate/inappropriate:30686::appropriate} Uterine Fundus: {Desc; firm/soft:30687} Incision: {Exam; incision:21111123} DVT Evaluation: {Exam; dvt:2111122} Labs: Lab Results  Component Value Date   WBC 7.3 11/18/2023   HGB 10.8 (L) 11/18/2023   HCT 32.9 (L) 11/18/2023   MCV 88.0 11/18/2023   PLT 274 11/18/2023      Latest Ref Rng & Units 09/15/2023    2:33 PM  CMP  Glucose 70 - 99 mg/dL 880   BUN 6 - 20 mg/dL 7   Creatinine 9.42 - 8.99 mg/dL 9.38   Sodium 865 - 855 mmol/L 136   Potassium 3.5 - 5.2 mmol/L 3.7   Chloride 96 - 106 mmol/L 102   CO2 20 - 29 mmol/L 20   Calcium 8.7 - 10.2 mg/dL 9.2   Total Protein 6.0 - 8.5 g/dL 6.5   Total Bilirubin 0.0 - 1.2 mg/dL <9.7   Alkaline Phos 44 - 121 IU/L 116   AST 0 - 40 IU/L 17   ALT 0 - 32 IU/L 10    Edinburgh Score:    03/31/2022   10:47 AM  Edinburgh Postnatal Depression Scale Screening Tool  I have been able to laugh and see the funny side of things. 0  I have looked forward with enjoyment to things. 0  I have blamed myself unnecessarily when things went wrong. 0  I have been anxious or worried for no good reason. 0  I have felt scared or panicky for no good reason. 0  Things have been getting on top of me. 0  I have been so unhappy that I have had difficulty sleeping. 0  I have felt sad or miserable. 0  I have been so unhappy that I have been crying. 0  The thought of harming myself has occurred to me. 0  Edinburgh Postnatal Depression Scale Total 0      Data saved with a previous flowsheet row definition   No data recorded  After visit meds:  Allergies as of 11/18/2023       Reactions   Shellfish Allergy Anaphylaxis, Swelling   Throat swelling   Prednisone Other (See Comments)   flutters  bradycardia     Med Rec must be completed prior to using this [facility]***        Discharge home in stable condition Infant Feeding: {Baby  feeding:23562} Infant Disposition:{CHL IP OB HOME WITH FNUYZM:76418} Discharge instruction: per After Visit Summary and Postpartum booklet. Activity: Advance as tolerated. Pelvic rest for 6 weeks.  Diet: {OB ipzu:78888878} Future Appointments: Future Appointments  Date Time Provider Department Center  12/02/2023  8:40 AM Tobb, Kardie, DO [facility]  12/11/2023 10:20 AM Edman Marsa PARAS, DO [facility]  04/05/2024  8:30 AM Alm Delon SAILOR, DO [facility] None   Follow up Visit: Message [facility] 7/16  Please schedule this patient for a In person postpartum visit in 4 weeks with the following provider: Any provider. Additional Postpartum F/U:n/a  High risk pregnancy complicated by: BMI Delivery mode:  Vaginal, Spontaneous Anticipated Birth Control:  Tubal   11/18/2023 Augustin JAYSON Slade, MD

## 2023-11-18 NOTE — Progress Notes (Signed)
 Labor progress note:   In to re-eval pt. Pt is s/p epidural, feeling well. Cat I tracing. Pitocin  2x2 started. Cx rechecked -- 3.5/70/-2. Discussed role of AROM w pt, pt provided verbal consent and AROM performed w mod amount of clear fluid. Will cont to uptitrate Pitocin . Anticipate SVD.   Alain Sor, MD OB Fellow, Faculty Practice [facility], Center for [facility]

## 2023-11-18 NOTE — Anesthesia Preprocedure Evaluation (Addendum)
 Anesthesia Evaluation  Patient identified by MRN, date of birth, ID band Patient awake  General Assessment Comment:  Postpartum, delivered around 10am. Epidural off since then. Per patient, epidural had been working well. She did say she felt short of breath laying down after initial epidural bolus that resolved with sitting up  Reviewed: Allergy & Precautions, NPO status , Patient's Chart, lab work & pertinent test results  History of Anesthesia Complications Negative for: history of anesthetic complications  Airway Mallampati: III  TM Distance: >3 FB Neck ROM: Full    Dental no notable dental hx. (+) Teeth Intact   Pulmonary asthma , neg sleep apnea, neg COPD, Patient abstained from smoking.Not current smoker   Pulmonary exam normal breath sounds clear to auscultation       Cardiovascular Exercise Tolerance: Good METS(-) hypertension(-) CAD and (-) Past MI negative cardio ROS (-) dysrhythmias  Rhythm:Regular Rate:Normal - Systolic murmurs    Neuro/Psych negative neurological ROS  negative psych ROS   GI/Hepatic negative GI ROS, Neg liver ROS,neg GERD  ,,  Endo/Other  neg diabetes  Class 3 obesity  Renal/GU negative Renal ROS     Musculoskeletal negative musculoskeletal ROS (+)    Abdominal  (+) + obese  Peds  Hematology negative hematology ROS (+)   Anesthesia Other Findings Past Medical History: No date: Asthma  Reproductive/Obstetrics                              Anesthesia Physical Anesthesia Plan  ASA: 3  Anesthesia Plan: Epidural   Post-op Pain Management: Minimal or no pain anticipated   Induction: Intravenous  PONV Risk Score and Plan: 0 and 3 and Ondansetron  and Midazolam   Airway Management Planned: Natural Airway, Nasal Cannula and Simple Face Mask  Additional Equipment: None  Intra-op Plan:   Post-operative Plan:   Informed Consent: I have reviewed the  patients History and Physical, chart, labs and discussed the procedure including the risks, benefits and alternatives for the proposed anesthesia with the patient or authorized representative who has indicated his/her understanding and acceptance.     Dental advisory given  Plan Discussed with: CRNA  Anesthesia Plan Comments: (Discussed r/b/a of using in-situ epidural vs GETA. Discussed risks of anesthesia with patient, including possibility of difficulty with spontaneous ventilation under anesthesia necessitating airway intervention, PONV, and rare risks such as cardiac or respiratory or neurological events, and allergic reactions. Discussed the role of CRNA in patient's perioperative care. Patient understands.)         Anesthesia Quick Evaluation

## 2023-11-18 NOTE — Anesthesia Postprocedure Evaluation (Signed)
 Anesthesia Post Note  Patient: Jamie [name]  Procedure(s) Performed: LIGATION, FALLOPIAN TUBE, POSTPARTUM (Abdomen)     Patient location during evaluation: PACU Anesthesia Type: Epidural Level of consciousness: oriented and awake and alert Pain management: pain level controlled Vital Signs Assessment: post-procedure vital signs reviewed and stable Respiratory status: spontaneous breathing, respiratory function stable and patient connected to nasal cannula oxygen Cardiovascular status: blood pressure returned to baseline and stable Postop Assessment: no headache, no backache, no apparent nausea or vomiting and epidural receding Anesthetic complications: no   No notable events documented.  Last Vitals:  Vitals:   11/18/23 1647 11/18/23 1704  BP: 101/72 110/69  Pulse: 88 86  Resp: 18 18  Temp:  36.7 C  SpO2: 98% 99%    Last Pain:  Vitals:   11/18/23 1704  TempSrc: Oral  PainSc:    Pain Goal:    LLE Motor Response: Purposeful movement (11/18/23 1647)   RLE Motor Response: Purposeful movement (11/18/23 1647)       Epidural/Spinal Function Cutaneous sensation: Able to Wiggle Toes (11/18/23 1647), Patient able to flex knees:  (can rock legs side to side) (11/18/23 1647), Patient able to lift hips off bed: No (11/18/23 1647), Back pain beyond tenderness at insertion site: No (11/18/23 1647), Progressively worsening motor and/or sensory loss: No (11/18/23 1647), Bowel and/or bladder incontinence post epidural: No (11/18/23 1647)  Rome Ade

## 2023-11-18 NOTE — Op Note (Signed)
 Jamie [name] 11/18/2023  PREOPERATIVE DIAGNOSIS:  Undesired fertility  POSTOPERATIVE DIAGNOSIS:  Undesired fertility  PROCEDURE:  Postpartum Bilateral Partial Tubal Sterilization using Ligasure   SURGEON:  Dr Lola  Assistant: Augustin JAYSON Slade, MD An experienced assistant was required given the standard of surgical care given the complexity of the case.  This assistant was needed for exposure, dissection, suctioning, retraction, instrument exchange and for overall help during the procedure.  ANESTHESIA:  Epidural  COMPLICATIONS:  None immediate.  ESTIMATED BLOOD LOSS:  Less than 20cc.  INDICATIONS: 33 y.o. yo H4E5985  with undesired fertility,status post vaginal delivery, desires permanent sterilization. Risks and benefits of procedure discussed with patient including permanence of method, bleeding, infection, injury to surrounding organs and need for additional procedures. Risk failure of 0.5-1% with increased risk of ectopic gestation if pregnancy occurs was also discussed with patient.   FINDINGS:  Normal uterus, tubes, and ovaries.  TECHNIQUE:  The patient was taken to the operating room where her epidural anesthesia was dosed up to surgical level and found to be adequate.  She was then placed in the dorsal supine position and prepped and draped in sterile fashion.  After an adequate timeout was performed, attention was turned to the patient's abdomen where a small transverse skin incision was made under the umbilical fold. The incision was taken down to the layer of fascia using the scalpel, and fascia was incised, and extended bilaterally using Mayo scissors. The peritoneum was entered in a blunt fashion.   Attention was then turned to the patient's uterus, and left fallopian tube was identified and followed out to the fimbriated end.  Ligasure device was used to cauterize and cut the mesosalpinx to proximal end of the fallopian tube, removing 4cm of tube. A similar process was carried  out on the right side allowing for bilateral tubal sterilization.    Good hemostasis was noted overall.  Local analgesia was drizzled on both operative sites.The instruments were then removed from the patient's abdomen and the fascial incision was repaired with 0 Vicryl, and the skin was closed with a 3-0Vicryl subcuticular stitch. The patient tolerated the procedure well.  Sponge, lap, and needle counts were correct times two.  The patient was then taken to the recovery room awake, extubated and in stable condition.   Augustin JAYSON Slade, MD FMOB Fellow, Faculty practice [facility], Center for [facility] 11/18/23  3:11 PM

## 2023-11-18 NOTE — H&P (Signed)
 OBSTETRIC ADMISSION HISTORY AND PHYSICAL  Jamie [name] is a 33 y.o. female [phone] with IUP at [redacted]w[redacted]d (dated by 10 wk US , Estimated Date of Delivery: 11/20/23) presenting for IOL due to BMI.   She reports +FMs, No LOF, no VB, no blurry vision, headaches or peripheral edema, and RUQ pain.    She plans on breast feeding. She request BTL for birth control.  She received her prenatal care [facility]   Prenatal History/Complications:  - BMI 40 - Hx LGA baby -- had baby weighing 4020g in second pregnancy  Past Medical History: Past Medical History:  Diagnosis Date   Asthma     Past Surgical History: Past Surgical History:  Procedure Laterality Date   NO PAST SURGERIES     WISDOM TOOTH EXTRACTION      Obstetrical History: OB History     Gravida  5   Para  3   Term  3   Preterm  0   AB  1   Living  3      SAB  1   IAB  0   Ectopic  0   Multiple  0   Live Births  3           Social History Social History   Socioeconomic History   Marital status: Single    Spouse name: Not on file   Number of children: 3   Years of education: Not on file   Highest education level: Not on file  Occupational History   Not on file  Tobacco Use   Smoking status: Never   Smokeless tobacco: Never  Vaping Use   Vaping status: Never Used  Substance and Sexual Activity   Alcohol use: Not Currently    Comment: socially, before pregnancy   Drug use: No   Sexual activity: Yes    Partners: Male    Birth control/protection: None  Other Topics Concern   Not on file  Social History Narrative   Not on file   Social Drivers of Health   Financial Resource Strain: Not on file  Food Insecurity: No Food Insecurity (11/18/2023)   Hunger Vital Sign    Worried About Running Out of Food in the Last Year: Never true    Ran Out of Food in the Last Year: Never true  Transportation Needs: No Transportation Needs (11/18/2023)   PRAPARE - Administrator, Civil Service  (Medical): No    Lack of Transportation (Non-Medical): No  Physical Activity: Not on file  Stress: Not on file  Social Connections: Socially Isolated (11/18/2023)   Social Connection and Isolation Panel    Frequency of Communication with Friends and Family: Never    Frequency of Social Gatherings with Friends and Family: Never    Attends Religious Services: Never    Database administrator or Organizations: No    Attends Engineer, structural: Never    Marital Status: Never married    Family History: Family History  Problem Relation Age of Onset   Heart murmur Mother    Hypertension Mother    Diabetes Father    Stroke Father    Heart disease Father    Heart failure Father    Arrhythmia Brother    Healthy Brother    Diabetes Maternal Grandfather    Heart disease Maternal Grandfather    Hypertension Maternal Grandfather    Heart attack Paternal Grandmother     Allergies: Allergies  Allergen Reactions   Shellfish  Allergy Anaphylaxis and Swelling    Throat swelling   Prednisone Other (See Comments)    flutters  bradycardia     Medications Prior to Admission  Medication Sig Dispense Refill Last Dose/Taking   aspirin  EC 81 MG tablet Take 1 tablet (81 mg total) by mouth daily. 60 tablet 2 11/17/2023   Ferric Maltol  (ACCRUFER ) 30 MG CAPS Take 1 capsule (30 mg total) by mouth 2 (two) times daily before a meal. Take 2 hrs before, or 2 hrs after a meal. 60 capsule 3 11/17/2023   Prenatal MV & Min w/FA-DHA (PRENATAL GUMMIES PO) Take by mouth.   11/17/2023   loratadine  (CLARITIN ) 10 MG tablet Take 1 tablet (10 mg total) by mouth daily. 30 tablet 11 More than a month     Review of Systems  All systems reviewed and negative except as stated in HPI.  Height 5' 11 (1.803 m), weight 132.5 kg, last menstrual period 01/28/2023, currently breastfeeding. General appearance: alert and cooperative Lungs: breathing comfortably on room air Heart: regular rate Abdomen: soft,  non-tender; gravid Extremities: no edema of bilateral lower extremities Presentation: cephalic Fetal monitoring: 145/mod/+a/-d Uterine activity: irregular     Prenatal labs: ABO, Rh: O/Positive/-- (12/26 1617) Antibody: Negative (12/26 1617) Rubella: 2.22 (12/26 1617) RPR: Non Reactive (04/29 0834)  HBsAg: Negative (12/26 1617)  HIV: Non Reactive (04/29 0834)  GBS: Negative/-- (06/25 1016)  2 hr Glucola wnl Genetic screening low risk, female Anatomy US  wnl Last US : At [redacted]w[redacted]d - cephalic presentation, EFW 3763g (79 %tile), AC 95%  Prenatal Transfer Tool  Maternal Diabetes: No Genetic Screening: Normal Maternal Ultrasounds/Referrals: Normal Fetal Ultrasounds or other Referrals:  None Maternal Substance Abuse:  No Significant Maternal Medications:  None Significant Maternal Lab Results:  Group B Strep negative Number of Prenatal Visits:greater than 3 verified prenatal visits Other Comments:  None  No results found for this or any previous visit (from the past 24 hours).  Patient Active Problem List   Diagnosis Date Noted   Encounter for induction of labor 11/18/2023   Obesity affecting pregnancy 06/22/2023   Supervision of other normal pregnancy, antepartum 04/30/2023   BMI 40.0-44.9, adult (HCC) 12/31/2021    Assessment/Plan:  Jamie [name] is a 33 y.o. G5P3013 at [redacted]w[redacted]d here for IOL due to BMI  #Labor: Discussed IOL process in detail w pt, cx now 3cm, plan to start induction with AROM/Pitocin   pt reports fast labors after AROM, so plan to get epidural first, then will AROM #Pain: Epidural #FWB: Cat I #ID:  GBS neg #MOF: Breast #MOC: BTL #Circ:  Yes  #BMI 40  #Hx LGA: Second baby 4000g  EFW for this baby 3763 - 79%tile, AC 95%  reports this baby feels bigger than prior  Alain Sor, MD OB Fellow, Faculty Practice [facility], Center for [facility] 11/18/2023 1:12 AM

## 2023-11-18 NOTE — Lactation Note (Signed)
 This note was copied from a baby's chart. Lactation Consultation Note  Patient Name: Jamie [name] Date: 11/18/2023 Age:34 hours Reason for consult: Initial assessment;Term  P4, Mother is experienced with breastfeeding and has baby latched when [facility] entered room. Discussed making sure baby is deep on breast and lips are flanged. Baby has breastfed x 3 since birth.  Feed on demand with cues.  Goal 8-12+ times per day after first 24 hrs.   Provided mother with manual pump fitted with 24 and 27 mm flanges. Mother is going to have BTL today. Suggest calling for help as needed.   Maternal Data Has patient been taught Hand Expression?: Yes Does the patient have breastfeeding experience prior to this delivery?: Yes How long did the patient breastfeed?: 3, 5 and one year  Feeding Mother's Current Feeding Choice: Breast Milk  LATCH Score Latch: Grasps breast easily, tongue down, lips flanged, rhythmical sucking.  Audible Swallowing: A few with stimulation  Type of Nipple: Everted at rest and after stimulation  Comfort (Breast/Nipple): Soft / non-tender  Hold (Positioning): Assistance needed to correctly position infant at breast and maintain latch.  LATCH Score: 8   Lactation Tools Discussed/Used Tools: Pump;Flanges Flange Size: 27;24 (24 R and 27 mm left) Breast pump type: Manual Pumping frequency: PRN  Interventions Interventions: Breast feeding basics reviewed;Hand pump;Education;LC Services brochure;CDC milk storage guidelines  Discharge Pump: DEBP;Personal;Hands Free (Medela DEBP and hands free)  Consult Status Consult Status: Follow-up Date: 11/19/23 Follow-up type: In-patient    [name] [facility] 11/18/2023, 1:24 PM

## 2023-11-19 LAB — CBC
HCT: 28.7 % — ABNORMAL LOW (ref 36.0–46.0)
Hemoglobin: 9.5 g/dL — ABNORMAL LOW (ref 12.0–15.0)
MCH: 29.3 pg (ref 26.0–34.0)
MCHC: 33.1 g/dL (ref 30.0–36.0)
MCV: 88.6 fL (ref 80.0–100.0)
Platelets: 216 K/uL (ref 150–400)
RBC: 3.24 MIL/uL — ABNORMAL LOW (ref 3.87–5.11)
RDW: 14.1 % (ref 11.5–15.5)
WBC: 7.7 K/uL (ref 4.0–10.5)
nRBC: 0 % (ref 0.0–0.2)

## 2023-11-19 MED ORDER — TETANUS-DIPHTH-ACELL PERTUSSIS 5-2.5-18.5 LF-MCG/0.5 IM SUSY: 0.5000 mL | PREFILLED_SYRINGE | Freq: Once | INTRAMUSCULAR | Status: DC

## 2023-11-19 NOTE — Anesthesia Postprocedure Evaluation (Signed)
 Anesthesia Post Note  Patient: Jamie [name]  Procedure(s) Performed: AN AD HOC LABOR EPIDURAL     Patient location during evaluation: Mother Baby Anesthesia Type: Epidural Level of consciousness: awake, oriented and awake and alert Pain management: pain level controlled Vital Signs Assessment: post-procedure vital signs reviewed and stable Respiratory status: spontaneous breathing, nonlabored ventilation and respiratory function stable Cardiovascular status: stable Postop Assessment: no headache, patient able to bend at knees, adequate PO intake, able to ambulate and no apparent nausea or vomiting Anesthetic complications: no   No notable events documented.  Last Vitals:  Vitals:   11/18/23 2159 11/19/23 0450  BP: 107/63 104/60  Pulse: 90 75  Resp: 18 18  Temp: 36.7 C 36.4 C  SpO2: 100% 98%    Last Pain:  Vitals:   11/19/23 0450  TempSrc: Oral  PainSc:    Pain Goal:                   [name]

## 2023-11-19 NOTE — Patient Instructions (Signed)

## 2023-11-19 NOTE — Progress Notes (Signed)
 POSTPARTUM PROGRESS NOTE  Post Partum Day 1  Subjective:  Jamie [name] is a 33 y.o. H4E5985 s/p SVD at [redacted]w[redacted]d.  She reports she is doing well. No acute events overnight. She denies any problems with ambulating, voiding or po intake. Denies nausea or vomiting.  Pain is moderately controlled, having incisional pain.  Lochia is minimal.  Objective: Blood pressure 114/72, pulse 79, temperature 97.9 F (36.6 C), temperature source Oral, resp. rate 18, height 5' 11 (1.803 m), weight 132.5 kg, last menstrual period 01/28/2023, SpO2 99%, unknown if currently breastfeeding.  Physical Exam:  General: alert, cooperative and no distress Chest: no respiratory distress Heart:regular rate, distal pulses intact Uterine Fundus: firm, appropriately tender DVT Evaluation: No calf swelling or tenderness Extremities: Trace bilateral lower extremity edema Skin: warm, dry  Recent Labs    11/18/23 0127 11/19/23 0454  HGB 10.8* 9.5*  HCT 32.9* 28.7*    Assessment/Plan: Jamie [name] is a 33 y.o. H4E5985 s/p SVD at [redacted]w[redacted]d  and POD#1 after bilateral partial salpingectomy  PPD#1 - Doing well  Routine postpartum care  Meeting milestones  POD#1 - Doing well, continuing to work on pain management  has passed flatus Contraception: BTL Feeding: Breast and formula Dispo: Plan for discharge tomorrow.   LOS: 1 day   Alain Sor, MD OB Fellow  11/19/2023, 9:24 PM

## 2023-11-20 ENCOUNTER — Other Ambulatory Visit (HOSPITAL_COMMUNITY): Payer: Self-pay

## 2023-11-20 MED ORDER — WHITE PETROLATUM EX OINT: 1.0000 | TOPICAL_OINTMENT | CUTANEOUS | Status: DC | PRN

## 2023-11-20 MED ORDER — ACETAMINOPHEN 325 MG PO TABS
650.0000 mg | ORAL_TABLET | ORAL | 0 refills | Status: AC | PRN
  Filled 2023-11-20: qty 30, 3d supply, fill #0

## 2023-11-20 MED ORDER — GELATIN ABSORBABLE 12-7 MM EX MISC: 1.0000 | Freq: Once | CUTANEOUS | Status: DC | PRN

## 2023-11-20 MED ORDER — SUCROSE 24% NICU/PEDS ORAL SOLUTION: 0.5000 mL | OROMUCOSAL | Status: DC | PRN

## 2023-11-20 MED ORDER — EPINEPHRINE TOPICAL FOR CIRCUMCISION 0.1 MG/ML: 1.0000 [drp] | TOPICAL | Status: DC | PRN

## 2023-11-20 MED ORDER — IBUPROFEN 600 MG PO TABS
600.0000 mg | ORAL_TABLET | Freq: Four times a day (QID) | ORAL | 0 refills | Status: AC
  Filled 2023-11-20: qty 30, 8d supply, fill #0

## 2023-11-20 MED ORDER — LIDOCAINE 1% INJECTION FOR CIRCUMCISION: 0.8000 mL | INJECTION | Freq: Once | INTRAVENOUS | Status: DC

## 2023-11-20 MED ORDER — SENNOSIDES-DOCUSATE SODIUM 8.6-50 MG PO TABS
2.0000 | ORAL_TABLET | ORAL | 0 refills | Status: AC
  Filled 2023-11-20: qty 30, 15d supply, fill #0

## 2023-11-20 NOTE — Lactation Note (Signed)
 This note was copied from a baby's chart. Lactation Consultation Note  Patient Name: Jamie [name] Unijb'd Date: 11/20/2023 Age:34 hours  Reason for consult: Follow-up assessment;Term;Infant weight loss  P4, [redacted]w[redacted]d, 7% weight loss  Upon arrival to room, mother had baby latched and breastfeeding well. Mother reports her milk volume has increased and she is leaking with let downs. She has experience with breastfeeding and denies any questions or concerns.   Discussed management of engorgement, signs and symptom of mastitis and if occurs to report to patient's OB provider. Discussed Lactogenesis II/ supply and demand for maintaining milk supply.        Feeding Mother's Current Feeding Choice: Breast Milk  LATCH Score Latch: Grasps breast easily, tongue down, lips flanged, rhythmical sucking.  Audible Swallowing: Spontaneous and intermittent  Type of Nipple: Everted at rest and after stimulation  Comfort (Breast/Nipple): Soft / non-tender (per mother-breast are filling and leaking)  Hold (Positioning): No assistance needed to correctly position infant at breast.  LATCH Score: 10    Interventions Interventions: Education;LC Services brochure  Discharge Discharge Education: Engorgement and breast care;Warning signs for feeding baby Pump: Personal;DEBP;Hands Free  Consult Status Consult Status: Complete Date: 11/20/23    Joshua Rojelio HERO 11/20/2023, 10:53 AM

## 2023-11-23 ENCOUNTER — Telehealth: Payer: Self-pay

## 2023-11-23 LAB — SURGICAL PATHOLOGY

## 2023-11-23 NOTE — Telephone Encounter (Signed)
 Called to complete IMPACT Mom 3 day check in. Left VM to return call.

## 2023-11-24 ENCOUNTER — Telehealth: Payer: Self-pay

## 2023-11-24 NOTE — Telephone Encounter (Signed)
 Called to complete IMPACT Mom 4 day check in. Left VM to return call.

## 2023-12-02 ENCOUNTER — Telehealth: Payer: Self-pay

## 2023-12-02 ENCOUNTER — Ambulatory Visit: Attending: Cardiology | Admitting: Cardiology

## 2023-12-02 NOTE — Telephone Encounter (Signed)
 Called to complete IMPACT Mom 4 day check in. Left VM to return call.

## 2023-12-08 ENCOUNTER — Telehealth (HOSPITAL_COMMUNITY): Payer: Self-pay | Admitting: *Deleted

## 2023-12-08 NOTE — Telephone Encounter (Signed)
 12/08/2023  Name: Jamie [name] MRN: [id number] DOB: 08-19-1990  Reason for Call:  Transition of Care Hospital Discharge Call  Contact Status: Patient Contact Status: Complete  Language assistant needed: Interpreter Mode: Interpreter Not Needed        Follow-Up Questions: Do You Have Any Concerns About Your Health As You Heal From Delivery?: No Do You Have Any Concerns About Your Infants Health?: No  Edinburgh Postnatal Depression Scale:  In the Past 7 Days:    PHQ2-9 Depression Scale:     Discharge Follow-up: Edinburgh score requires follow up?:  (says answers are the same as in hospital when score was 0, endorses she is doing well emotionally) Patient was advised of the following resources:: Support Group, Breastfeeding Support Group (declines postpartum group information via email)  Post-discharge interventions: Reviewed Newborn Safe Sleep Practices  Mliss Sieve, RN 01/08/2024 12:01

## 2023-12-11 ENCOUNTER — Ambulatory Visit: Admitting: Family Medicine

## 2023-12-15 ENCOUNTER — Encounter: Payer: Self-pay | Admitting: Advanced Practice Midwife

## 2023-12-15 ENCOUNTER — Ambulatory Visit: Admitting: Advanced Practice Midwife

## 2023-12-16 ENCOUNTER — Ambulatory Visit: Admitting: Obstetrics and Gynecology

## 2023-12-28 ENCOUNTER — Ambulatory Visit (INDEPENDENT_AMBULATORY_CARE_PROVIDER_SITE_OTHER): Admitting: Obstetrics and Gynecology

## 2023-12-28 ENCOUNTER — Encounter: Payer: Self-pay | Admitting: Obstetrics and Gynecology

## 2023-12-28 DIAGNOSIS — Z9079 Acquired absence of other genital organ(s): Secondary | ICD-10-CM | POA: Insufficient documentation

## 2023-12-28 NOTE — Progress Notes (Signed)
 Slight cramping in stomach. States thinks just starting first menses since delivery. Edinburgh 0. BTL done 11/18/2023. Healed well with dressing off but now swollen and sore. Looks like might be open.    Post Partum Visit Note  Jamie [name] is a 33 y.o. [id number] female who presents for a postpartum visit. She is 6 weeks postpartum following a normal spontaneous vaginal delivery.  I have fully reviewed the prenatal and intrapartum course. The delivery was at [redacted] week gestational weeks.  Anesthesia: epidural. Postpartum course has been good. Baby is doing well. Baby is feeding by breast. Bleeding staining only. Bowel function is normal. Bladder function is normal. Patient is not sexually active. Contraception method is tubal ligation. Postpartum depression screening: negative.   Upstream - 12/28/23 1120       Pregnancy Intention Screening   Does the patient want to become pregnant in the next year? No    Does the patient's partner want to become pregnant in the next year? No    Would the patient like to discuss contraceptive options today? No      Contraception Wrap Up   Current Method Female Sterilization    End Method Female Sterilization         The pregnancy intention screening data noted above was reviewed. Potential methods of contraception were discussed. The patient elected to proceed with Female Sterilization.   Edinburgh Postnatal Depression Scale - 12/28/23 1118       Edinburgh Postnatal Depression Scale:  In the Past 7 Days   I have been able to laugh and see the funny side of things. 0    I have looked forward with enjoyment to things. 0    I have blamed myself unnecessarily when things went wrong. 0    I have been anxious or worried for no good reason. 0    I have felt scared or panicky for no good reason. 0    Things have been getting on top of me. 0    I have been so unhappy that I have had difficulty sleeping. 0    I have felt sad or miserable. 0    I have been so  unhappy that I have been crying. 0    The thought of harming myself has occurred to me. 0    Edinburgh Postnatal Depression Scale Total 0          Health Maintenance Due  Topic Date Due   Pneumococcal Vaccine (1 of 2 - PCV) Never done   Hepatitis B Vaccines 19-59 Average Risk (3 of 3 - 19+ 3-dose series) 03/30/2015   HPV VACCINES (1 - 3-dose SCDM series) Never done   COVID-19 Vaccine (1 - 2024-25 season) Never done   INFLUENZA VACCINE  12/04/2023    The following portions of the patient's history were reviewed and updated as appropriate: allergies, current medications, past family history, past medical history, past social history, past surgical history, and problem list.  Review of Systems Pertinent items are noted in HPI.  Objective:  BP 120/86   Pulse 75   Ht 5' 11 (1.803 m)   Wt 270 lb (122.5 kg)   LMP 12/26/2023 (Approximate)   BMI 37.66 kg/m    General:  alert and cooperative   Breasts:  not indicated  Lungs: Normal effort  Heart:  Normal rate  Abdomen: soft, non-tender; bowel sounds normal; no masses,  no organomegaly   Wound wound edges not well approximated on left side. Silver Nitrate applied  GU exam:  not indicated       Assessment:   1. Postpartum care and examination (Primary) Doing well. Concerned about tubal incision in umbilicus. Turned suddenly a couple days ago bathing her child and felt pulling/pain since then and scant drainage. Wound edges are not well approximated on left side but low concern for infection. Silver nitrate applied. Discussed wound care and S&S of infection.   Plan:   Essential components of care per ACOG recommendations:  1.  Mood and well being: Patient with negative depression screening today. Reviewed local resources for support.  - Patient tobacco use? No.   - hx of drug use? No.    2. Infant care and feeding:  -Patient currently breastmilk feeding? Yes. Reviewed importance of draining breast regularly to support  lactation.  -Social determinants of health (SDOH) reviewed in EPIC. No concerns  3. Sexuality, contraception and birth spacing - Patient does not want a pregnancy in the next year.  Desired family size is 4 children.  - Reviewed reproductive life planning. Reviewed contraceptive methods based on pt preferences and effectiveness.  Patient desired Female Sterilization today.   - Discussed birth spacing of 18 months  4. Sleep and fatigue -Encouraged family/partner/community support of 4 hrs of uninterrupted sleep to help with mood and fatigue  5. Physical Recovery  - Discussed patients delivery and complications. She describes her labor as good. - Patient had a Vaginal, no problems at delivery. Patient had a 2nd degree laceration. Perineal healing reviewed. Patient expressed understanding - Patient has urinary incontinence? No. - Patient is safe to resume physical and sexual activity  6.  Health Maintenance - HM due items addressed Yes - Last pap smear  Diagnosis  Date Value Ref Range Status  07/19/2021   Final   - Negative for intraepithelial lesion or malignancy (NILM)   Pap smear done at today's visit.  -Breast Cancer screening indicated? No.   7. Chronic Disease/Pregnancy Condition follow up: None - PCP follow up   [name], [facility] 12/28/23

## 2024-04-05 ENCOUNTER — Ambulatory Visit: Admitting: Dermatology

## 2024-04-05 ENCOUNTER — Encounter: Payer: Self-pay | Admitting: Dermatology

## 2024-04-05 VITALS — BP 111/72 | HR 87

## 2024-04-05 DIAGNOSIS — L910 Hypertrophic scar: Secondary | ICD-10-CM

## 2024-04-05 DIAGNOSIS — L508 Other urticaria: Secondary | ICD-10-CM | POA: Diagnosis not present

## 2024-04-05 MED ORDER — MOMETASONE FUROATE 0.1 % EX CREA: 1.0000 | TOPICAL_CREAM | Freq: Every day | CUTANEOUS | 6 refills | Status: AC

## 2024-04-05 MED ORDER — TRIAMCINOLONE ACETONIDE 10 MG/ML IJ SUSP
10.0000 mg | Freq: Once | INTRAMUSCULAR | Status: AC
  Administered 2024-04-05: 10 mg

## 2024-04-05 NOTE — Progress Notes (Signed)
 New Patient Visit   Subjective  Jamie [name] is a 33 y.o. female who presents for the following: Irritant Dermetitist  Patient states she has skin irritaion located at the scattered that she would like to have examined. Patient reports the areas have been there for 1 year. She reports the areas can be bothersome bothersome. Patient reports the areas can be very itchy and stings some. Patient rates irritation 10 out of 10. Patient reports she has previously been treated for these areas. Patient denies Hx of bx. Patient denies family history of skin cancer(s).  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 Her son is currently 4 months.   Patient provided verbal consent for the use of an AI-assisted program to generate a detailed after-visit summary. The patient understands that the AI tool is used to support clinical documentation and that all information will be reviewed and verified by the healthcare provider.  The following portions of the chart were reviewed this encounter and updated as appropriate: medications, allergies, medical history  Review of Systems:  No other skin or systemic complaints except as noted in HPI or Assessment and Plan.  Objective  Well appearing patient in no apparent distress; mood and affect are within normal limits.  A full examination was performed including scalp, head, eyes, ears, nose, lips, neck, chest, axillae, abdomen, back, buttocks, bilateral upper extremities, bilateral lower extremities, hands, feet, fingers, toes, fingernails, and toenails. All findings within normal limits unless otherwise noted below.   Relevant exam findings are noted in the Assessment and Plan.    Assessment & Plan   PHYSICAL URTICARIA Exam: edematous pink papules and plaques +/-  dermatographism  Flared  Pt Education Discussed: Urticaria or hives is a pink to red patchy whelp- like rash of the skin that typically itches and it is the result of histamine release in the skin.   Hives may have multiple causes including stress, medications, infections, and systemic illness.  Sometimes there is a family history of chronic urticaria.   Physical urticarias may be caused by pressure (dermatographism), heat, sun, cold, vibration.  Insect bites can cause papular urticaria. It is often difficult to find the cause of generalized hives.  Statistically, 70% of the time a cause of generalized hives is not found.  Sometimes hives can spontaneously resolve. Other times hives can persist and when it does, and no cause is found, and it has been at least 6 weeks since started, it is called chronic idiopathic urticaria. Antihistamines are the mainstay for treatment.  In severe cases Xolair injections may be used.   Likely triggered by physical factors such as pressure, possibly related to physiological changes during pregnancy and breastfeeding. Symptoms include itchy, sometimes stinging hives that resolve after a few hours. No current antihistamine use. Allergic to prednisone, causing rapid heartbeat. Benadryl  and Benadryl  cream provided temporary relief. Potential for resolution post-breastfeeding, but chronicity is uncertain.  - Take Zyrtec  daily to decrease severity and frequency of hives. - Use mometasone cream sparingly for symptomatic relief, avoiding application on breasts to prevent transfer to baby. - Monitor for severe allergic reactions such as throat tightness or facial swelling and seek emergency care if she occurs. - Will consider Dupixent if urticaria becomes chronic post-breastfeeding.  -  KELOID Exam: Firm pink/brown dermal papule(s)/plaque(s).  Treatment Plan: - Ilk Injection Completed while in  Office today (see procedure note)  KELOID Umbilicus Procedure Note  Intralesional Injection  Location: Belly Button  Informed Consent: Discussed risks (infection, pain, bleeding, bruising, thinning of the skin, loss of skin pigment, lack of resolution, and recurrence of lesion) and benefits of the procedure, as well as the alternatives. Informed consent was obtained. Preparation: The area was prepared a standard fashion.  Anesthesia:None  Procedure Details: An intralesional injection was performed with Kenalog 10 mg/cc. 0.1 cc in total were injected. NDC #: 9996-9505-79 Exp: 07/2024 LOT: [id number]  Total number of injections: 3  Plan: The patient was instructed on post-op care. Recommend OTC analgesia as needed for pain.  triamcinolone acetonide (KENALOG) 10 MG/ML injection 10 mg - Umbilicus  CHRONIC URTICARIA   Related Medications mometasone (ELOCON) 0.1 % cream Apply 1 Application topically daily. Apply 2 times daily for 2 weeks THEN STOP for 2 Weeks, Use 2 weeks on and 2 weeks off  Return in about 5 months (around 09/03/2024) for Urticaria F/U.  I, Jetta Ager, am acting as neurosurgeon for [employer], DO.  Documentation: I have reviewed the above documentation for accuracy and completeness, and I agree with the above.  Delon Lenis, DO

## 2024-04-05 NOTE — Patient Instructions (Addendum)
 VISIT SUMMARY:  Today, we discussed your ongoing issue with pressure urticaria, which began during your pregnancy, and a keloid at your abdominal surgical scar. We reviewed your symptoms, current treatments, and potential future steps to manage these conditions.  YOUR PLAN:  -PRESSURE URTICARIA:  Pressure urticaria is a condition where hives develop in response to pressure on the skin, likely triggered by physical factors and possibly related to changes during pregnancy and breastfeeding.   To manage this, you should take Zyrtec  daily to reduce the severity and frequency of the hives.   You can also use mometasone cream sparingly for relief, but avoid applying it on your breasts to prevent transfer to your baby. If you experience severe allergic reactions like throat tightness or facial swelling, seek emergency care immediately.   We will consider using Dupixent if the urticaria continues after you stop breastfeeding.  -KELOID OF ABDOMINAL SURGICAL SCAR:  A keloid is a type of raised scar that can form after an injury or surgery, influenced by hormonal changes and the location of the scar.   You received a Kenalog injection today to help flatten the keloid and reduce any itching or growth.  INSTRUCTIONS:  Please follow up with us  if you experience any severe allergic reactions or if your symptoms do not improve with the current treatment plan. We will reassess your condition after you stop breastfeeding to determine if further treatment is necessary.     Important Information   Due to recent changes in healthcare laws, you may see results of your pathology and/or laboratory studies on MyChart before the doctors have had a chance to review them. We understand that in some cases there may be results that are confusing or concerning to you. Please understand that not all results are received at the same time and often the doctors may need to interpret multiple results in order to provide  you with the best plan of care or course of treatment. Therefore, we ask that you please give us  2 business days to thoroughly review all your results before contacting the office for clarification. Should we see a critical lab result, you will be contacted sooner.     If You Need Anything After Your Visit   If you have any questions or concerns for your doctor, please call our main line at [phone]. If no one answers, please leave a voicemail as directed and we will return your call as soon as possible. Messages left after 4 pm will be answered the following business day.    You may also send us  a message via MyChart. We typically respond to MyChart messages within 1-2 business days.  For prescription refills, please ask your pharmacy to contact our office. Our fax number is [phone].  If you have an urgent issue when the clinic is closed that cannot wait until the next business day, you can page your doctor at the number below.     Please note that while we do our best to be available for urgent issues outside of office hours, we are not available 24/7.    If you have an urgent issue and are unable to reach us , you may choose to seek medical care at your doctor's office, retail clinic, urgent care center, or emergency room.   If you have a medical emergency, please immediately call 911 or go to the emergency department. In the event of inclement weather, please call our main line at [phone] for an update on the status of  any delays or closures.  Dermatology Medication Tips: Please keep the boxes that topical medications come in in order to help keep track of the instructions about where and how to use these. Pharmacies typically print the medication instructions only on the boxes and not directly on the medication tubes.   If your medication is too expensive, please contact our office at [phone] or send us  a message through MyChart.    We are unable to tell what your  co-pay for medications will be in advance as this is different depending on your insurance coverage. However, we may be able to find a substitute medication at lower cost or fill out paperwork to get insurance to cover a needed medication.    If a prior authorization is required to get your medication covered by your insurance company, please allow us  1-2 business days to complete this process.   Drug prices often vary depending on where the prescription is filled and some pharmacies may offer cheaper prices.   The website www.goodrx.com contains coupons for medications through different pharmacies. The prices here do not account for what the cost may be with help from insurance (it may be cheaper with your insurance), but the website can give you the price if you did not use any insurance.  - You can print the associated coupon and take it with your prescription to the pharmacy.  - You may also stop by our office during regular business hours and pick up a GoodRx coupon card.  - If you need your prescription sent electronically to a different pharmacy, notify our office through [facility] or by phone at [phone]

## 2024-10-04 ENCOUNTER — Ambulatory Visit: Admitting: Dermatology
